# Patient Record
Sex: Female | Born: 1950 | Race: White | Hispanic: No | Smoking: Never smoker
Health system: Southern US, Community
[De-identification: ages and names within clinical notes are randomized; demographics above are authoritative.]

## PROBLEM LIST (undated history)

## (undated) DIAGNOSIS — M797 Fibromyalgia: Secondary | ICD-10-CM

## (undated) DIAGNOSIS — K219 Gastro-esophageal reflux disease without esophagitis: Secondary | ICD-10-CM

## (undated) DIAGNOSIS — M549 Dorsalgia, unspecified: Secondary | ICD-10-CM

## (undated) DIAGNOSIS — M25552 Pain in left hip: Secondary | ICD-10-CM

## (undated) DIAGNOSIS — G4700 Insomnia, unspecified: Secondary | ICD-10-CM

## (undated) DIAGNOSIS — M25551 Pain in right hip: Secondary | ICD-10-CM

## (undated) DIAGNOSIS — I4891 Unspecified atrial fibrillation: Secondary | ICD-10-CM

## (undated) DIAGNOSIS — G4730 Sleep apnea, unspecified: Secondary | ICD-10-CM

## (undated) DIAGNOSIS — I10 Essential (primary) hypertension: Secondary | ICD-10-CM

## (undated) DIAGNOSIS — R42 Dizziness and giddiness: Secondary | ICD-10-CM

## (undated) DIAGNOSIS — G2581 Restless legs syndrome: Secondary | ICD-10-CM

## (undated) DIAGNOSIS — Z889 Allergy status to unspecified drugs, medicaments and biological substances status: Secondary | ICD-10-CM

## (undated) DIAGNOSIS — I499 Cardiac arrhythmia, unspecified: Secondary | ICD-10-CM

## (undated) DIAGNOSIS — H8109 Meniere's disease, unspecified ear: Secondary | ICD-10-CM

## (undated) DIAGNOSIS — E785 Hyperlipidemia, unspecified: Secondary | ICD-10-CM

## (undated) DIAGNOSIS — E669 Obesity, unspecified: Secondary | ICD-10-CM

## (undated) HISTORY — DX: Meniere's disease, unspecified ear: H81.09

## (undated) HISTORY — DX: Pain in left hip: M25.552

## (undated) HISTORY — DX: Pain in right hip: M25.551

## (undated) HISTORY — DX: Fibromyalgia: M79.7

## (undated) HISTORY — PX: BUNIONECTOMY: SHX129

## (undated) HISTORY — DX: Gastro-esophageal reflux disease without esophagitis: K21.9

## (undated) HISTORY — PX: UVULECTOMY: SHX2631

## (undated) HISTORY — PX: APPENDECTOMY: SHX54

## (undated) HISTORY — DX: Hyperlipidemia, unspecified: E78.5

## (undated) HISTORY — DX: Dorsalgia, unspecified: M54.9

## (undated) HISTORY — DX: Unspecified atrial fibrillation: I48.91

## (undated) HISTORY — PX: HEMORROIDECTOMY: SUR656

## (undated) HISTORY — PX: UPPER ENDOSCOPIC ULTRASOUND W/ FNA: SHX2601

## (undated) HISTORY — DX: Dizziness and giddiness: R42

## (undated) HISTORY — DX: Insomnia, unspecified: G47.00

## (undated) HISTORY — DX: Restless legs syndrome: G25.81

## (undated) HISTORY — DX: Obesity, unspecified: E66.9

## (undated) HISTORY — DX: Essential (primary) hypertension: I10

---

## 1978-06-19 HISTORY — PX: OTHER SURGICAL HISTORY: SHX169

## 1998-07-09 ENCOUNTER — Other Ambulatory Visit: Admission: RE | Admit: 1998-07-09 | Discharge: 1998-07-09 | Payer: Self-pay | Admitting: Obstetrics and Gynecology

## 1999-01-17 ENCOUNTER — Encounter: Admission: RE | Admit: 1999-01-17 | Discharge: 1999-02-17 | Payer: Self-pay | Admitting: Family Medicine

## 1999-06-14 ENCOUNTER — Encounter: Payer: Self-pay | Admitting: Family Medicine

## 1999-06-14 ENCOUNTER — Encounter: Admission: RE | Admit: 1999-06-14 | Discharge: 1999-06-14 | Payer: Self-pay | Admitting: Family Medicine

## 1999-07-19 ENCOUNTER — Encounter: Admission: RE | Admit: 1999-07-19 | Discharge: 1999-09-12 | Payer: Self-pay | Admitting: Family Medicine

## 1999-09-20 ENCOUNTER — Other Ambulatory Visit: Admission: RE | Admit: 1999-09-20 | Discharge: 1999-09-20 | Payer: Self-pay | Admitting: Obstetrics and Gynecology

## 2000-11-05 ENCOUNTER — Ambulatory Visit (HOSPITAL_COMMUNITY): Admission: RE | Admit: 2000-11-05 | Discharge: 2000-11-05 | Payer: Self-pay | Admitting: Gastroenterology

## 2000-11-05 ENCOUNTER — Encounter (INDEPENDENT_AMBULATORY_CARE_PROVIDER_SITE_OTHER): Payer: Self-pay | Admitting: Specialist

## 2002-03-13 ENCOUNTER — Other Ambulatory Visit: Admission: RE | Admit: 2002-03-13 | Discharge: 2002-03-13 | Payer: Self-pay | Admitting: Obstetrics and Gynecology

## 2003-03-23 ENCOUNTER — Ambulatory Visit (HOSPITAL_BASED_OUTPATIENT_CLINIC_OR_DEPARTMENT_OTHER): Admission: RE | Admit: 2003-03-23 | Discharge: 2003-03-23 | Payer: Self-pay | Admitting: Orthopedic Surgery

## 2003-03-23 ENCOUNTER — Ambulatory Visit (HOSPITAL_COMMUNITY): Admission: RE | Admit: 2003-03-23 | Discharge: 2003-03-23 | Payer: Self-pay | Admitting: Orthopedic Surgery

## 2003-06-20 HISTORY — PX: THYROIDECTOMY, PARTIAL: SHX18

## 2004-11-30 ENCOUNTER — Ambulatory Visit (HOSPITAL_COMMUNITY): Admission: RE | Admit: 2004-11-30 | Discharge: 2004-11-30 | Payer: Self-pay | Admitting: Gastroenterology

## 2005-02-09 ENCOUNTER — Encounter (INDEPENDENT_AMBULATORY_CARE_PROVIDER_SITE_OTHER): Payer: Self-pay | Admitting: Specialist

## 2005-02-10 ENCOUNTER — Inpatient Hospital Stay (HOSPITAL_COMMUNITY): Admission: RE | Admit: 2005-02-10 | Discharge: 2005-02-15 | Payer: Self-pay | Admitting: Surgery

## 2005-03-15 ENCOUNTER — Encounter: Admission: RE | Admit: 2005-03-15 | Discharge: 2005-03-15 | Payer: Self-pay | Admitting: Surgery

## 2005-06-13 ENCOUNTER — Other Ambulatory Visit: Admission: RE | Admit: 2005-06-13 | Discharge: 2005-06-13 | Payer: Self-pay | Admitting: Family Medicine

## 2005-10-04 ENCOUNTER — Encounter: Admission: RE | Admit: 2005-10-04 | Discharge: 2005-10-04 | Payer: Self-pay | Admitting: Family Medicine

## 2005-10-10 ENCOUNTER — Encounter: Admission: RE | Admit: 2005-10-10 | Discharge: 2005-10-10 | Payer: Self-pay | Admitting: Family Medicine

## 2005-10-17 ENCOUNTER — Other Ambulatory Visit: Admission: RE | Admit: 2005-10-17 | Discharge: 2005-10-17 | Payer: Self-pay | Admitting: Diagnostic Radiology

## 2006-03-08 ENCOUNTER — Ambulatory Visit (HOSPITAL_COMMUNITY): Admission: RE | Admit: 2006-03-08 | Discharge: 2006-03-09 | Payer: Self-pay | Admitting: Surgery

## 2006-03-08 ENCOUNTER — Encounter (INDEPENDENT_AMBULATORY_CARE_PROVIDER_SITE_OTHER): Payer: Self-pay | Admitting: *Deleted

## 2009-05-10 ENCOUNTER — Encounter: Admission: RE | Admit: 2009-05-10 | Discharge: 2009-05-10 | Payer: Self-pay | Admitting: Family Medicine

## 2009-06-09 ENCOUNTER — Ambulatory Visit: Payer: Self-pay | Admitting: Oncology

## 2009-06-14 LAB — CBC WITH DIFFERENTIAL/PLATELET
BASO%: 0.2 % (ref 0.0–2.0)
Eosinophils Absolute: 0.2 10*3/uL (ref 0.0–0.5)
HGB: 12.2 g/dL (ref 11.6–15.9)
LYMPH%: 31.1 % (ref 14.0–49.7)
MCH: 27.8 pg (ref 25.1–34.0)
MONO#: 0.5 10*3/uL (ref 0.1–0.9)
MONO%: 4.3 % (ref 0.0–14.0)
RDW: 12.3 % (ref 11.2–14.5)
WBC: 11.1 10*3/uL — ABNORMAL HIGH (ref 3.9–10.3)

## 2009-06-14 LAB — COMPREHENSIVE METABOLIC PANEL
ALT: 13 U/L (ref 0–35)
Alkaline Phosphatase: 83 U/L (ref 39–117)
BUN: 10 mg/dL (ref 6–23)
CO2: 27 mEq/L (ref 19–32)
Calcium: 8.8 mg/dL (ref 8.4–10.5)
Chloride: 102 mEq/L (ref 96–112)
Creatinine, Ser: 0.66 mg/dL (ref 0.40–1.20)
Sodium: 141 mEq/L (ref 135–145)

## 2009-07-20 ENCOUNTER — Ambulatory Visit (HOSPITAL_BASED_OUTPATIENT_CLINIC_OR_DEPARTMENT_OTHER): Admission: RE | Admit: 2009-07-20 | Discharge: 2009-07-20 | Payer: Self-pay | Admitting: Surgery

## 2010-07-19 ENCOUNTER — Telehealth (INDEPENDENT_AMBULATORY_CARE_PROVIDER_SITE_OTHER): Payer: Self-pay | Admitting: *Deleted

## 2010-07-19 ENCOUNTER — Ambulatory Visit: Payer: Self-pay | Admitting: Cardiology

## 2010-07-20 ENCOUNTER — Institutional Professional Consult (permissible substitution) (INDEPENDENT_AMBULATORY_CARE_PROVIDER_SITE_OTHER): Payer: BC Managed Care – PPO | Admitting: Cardiology

## 2010-07-20 DIAGNOSIS — I10 Essential (primary) hypertension: Secondary | ICD-10-CM

## 2010-07-20 DIAGNOSIS — E119 Type 2 diabetes mellitus without complications: Secondary | ICD-10-CM

## 2010-07-20 DIAGNOSIS — I4891 Unspecified atrial fibrillation: Secondary | ICD-10-CM

## 2010-07-25 ENCOUNTER — Ambulatory Visit: Payer: Self-pay | Admitting: Cardiology

## 2010-07-27 NOTE — Progress Notes (Signed)
Summary: Cardiology Phone Note - Afib  Phone Note Other Incoming Call back at Work Phone [phone]   Caller: Patient Caller: Event Monitoring Summary of Call: Received call from biomonitoring company. Pt had event monitor placed today, with initial recording of sinus rhythm. Second documentation showed afib with a rate of 160. Monitoring company reports that f/up EKG showed sinus rhythm from there with 84bpm. Called pt, at that time she had a "catching" sensation under her left breast with some odd discomfort sensation that has since resolved. D/w Dr. Andee Lineman - since in sinus rhythm, will leave a message for Dr. Elvis Coil office to either see pt in office tomorrow or have Dr. [place] review information for further intervention. Also advised pt that if she should have persistent episode of symptoms, to proceed to ER and not to drive self. She expressed understanding and gratitude. Initial call taken by: Ronie Spies PA-C

## 2010-08-03 ENCOUNTER — Ambulatory Visit (INDEPENDENT_AMBULATORY_CARE_PROVIDER_SITE_OTHER): Payer: BC Managed Care – PPO | Admitting: *Deleted

## 2010-08-03 DIAGNOSIS — R5381 Other malaise: Secondary | ICD-10-CM

## 2010-08-03 DIAGNOSIS — I4891 Unspecified atrial fibrillation: Secondary | ICD-10-CM

## 2010-08-17 ENCOUNTER — Ambulatory Visit: Payer: Self-pay | Admitting: Cardiology

## 2010-08-17 ENCOUNTER — Encounter (INDEPENDENT_AMBULATORY_CARE_PROVIDER_SITE_OTHER): Payer: BC Managed Care – PPO | Admitting: Cardiology

## 2010-08-17 DIAGNOSIS — I4891 Unspecified atrial fibrillation: Secondary | ICD-10-CM

## 2010-09-05 LAB — BASIC METABOLIC PANEL
Creatinine, Ser: 0.67 mg/dL (ref 0.4–1.2)
GFR calc Af Amer: >60 mL/min (ref >60)
GFR calc non Af Amer: >60 mL/min (ref >60)
Potassium: 4.6 mEq/L (ref 3.5–5.1)
Sodium: 139 mEq/L (ref 135–145)

## 2010-09-08 LAB — POCT HEMOGLOBIN-HEMACUE: Hemoglobin: 13.3 g/dL (ref 12.0–15.0)

## 2010-09-08 LAB — GLUCOSE, CAPILLARY
Glucose-Capillary: 124 mg/dL — ABNORMAL HIGH (ref 70–99)
Glucose-Capillary: 138 mg/dL — ABNORMAL HIGH (ref 70–99)

## 2010-10-19 ENCOUNTER — Encounter: Payer: Self-pay | Admitting: Cardiology

## 2010-10-19 DIAGNOSIS — G4700 Insomnia, unspecified: Secondary | ICD-10-CM | POA: Insufficient documentation

## 2010-10-19 DIAGNOSIS — K219 Gastro-esophageal reflux disease without esophagitis: Secondary | ICD-10-CM | POA: Insufficient documentation

## 2010-10-19 DIAGNOSIS — I10 Essential (primary) hypertension: Secondary | ICD-10-CM | POA: Insufficient documentation

## 2010-10-19 DIAGNOSIS — E118 Type 2 diabetes mellitus with unspecified complications: Secondary | ICD-10-CM | POA: Insufficient documentation

## 2010-10-19 DIAGNOSIS — M797 Fibromyalgia: Secondary | ICD-10-CM | POA: Insufficient documentation

## 2010-10-19 DIAGNOSIS — R42 Dizziness and giddiness: Secondary | ICD-10-CM | POA: Insufficient documentation

## 2010-10-19 DIAGNOSIS — H8109 Meniere's disease, unspecified ear: Secondary | ICD-10-CM | POA: Insufficient documentation

## 2010-10-19 DIAGNOSIS — I4891 Unspecified atrial fibrillation: Secondary | ICD-10-CM | POA: Insufficient documentation

## 2010-10-19 DIAGNOSIS — E669 Obesity, unspecified: Secondary | ICD-10-CM | POA: Insufficient documentation

## 2010-10-26 ENCOUNTER — Encounter: Payer: Self-pay | Admitting: Cardiology

## 2010-10-26 ENCOUNTER — Ambulatory Visit (INDEPENDENT_AMBULATORY_CARE_PROVIDER_SITE_OTHER): Payer: BC Managed Care – PPO | Admitting: Cardiology

## 2010-10-26 DIAGNOSIS — I4891 Unspecified atrial fibrillation: Secondary | ICD-10-CM

## 2010-10-26 NOTE — Assessment & Plan Note (Signed)
She will continue on her Pradaxa and metoprolol. I encouraged her lose weight. We'll have her see Dr. Shirlee Latch in followup for cardiology care and Dr. Larita Fife for her primary care.

## 2010-10-26 NOTE — Progress Notes (Signed)
Subjective:   Suzanne Greer is seen today for followup of her paroxysmal atrial fibrillation, obesity, and diabetes. She's been on low-dose metoprolol and Pradaxa and in general has tolerated that well without recurrence of atrial fibrillation. He is exercising regularly at the gymnasium that she is not losing weight. She is off of her Wellbutrin  She's had diabetes for approximately 1 one year. She has a history of hypercholesterolemia. She's had a negative sleep study the past for sleep apnea. She's had an episode of atrial fibrillation. She does have a history of fibromyalgia.  Current Outpatient Prescriptions  Medication Sig Dispense Refill  . cetirizine (ZYRTEC) 10 MG tablet Take 10 mg by mouth daily.        . dabigatran (PRADAXA) 150 MG CAPS Take 150 mg by mouth every 12 (twelve) hours.        Marland Kitchen esomeprazole (NEXIUM) 40 MG capsule Take 40 mg by mouth daily before breakfast.        . Eszopiclone (ESZOPICLONE) 3 MG TABS Take 3 mg by mouth at bedtime. Take immediately before bedtime       . metFORMIN (GLUMETZA) 1000 MG (MOD) 24 hr tablet Take 1,000 mg by mouth daily with breakfast.        . metoprolol succinate (TOPROL-XL) 25 MG 24 hr tablet Take 25 mg by mouth daily.        . simvastatin (ZOCOR) 40 MG tablet Take 40 mg by mouth at bedtime.        . traMADol (ULTRAM) 50 MG tablet Take 50 mg by mouth every 6 (six) hours as needed.        Marland Kitchen DISCONTD: buPROPion (WELLBUTRIN XL) 300 MG 24 hr tablet Take 300 mg by mouth daily.          Allergies  Allergen Reactions  . Sulfa Drugs Cross Reactors     Patient Active Problem List  Diagnoses  . Atrial fibrillation  . Obesity  . Diabetes mellitus  . Fibromyalgia  . Hypertension  . GERD (gastroesophageal reflux disease)  . Insomnia  . Meniere disease  . Vertigo    History  Smoking status  . Never Smoker   Smokeless tobacco  . Never Used    History  Alcohol Use No    Family History  Problem Relation Age of Onset  . Cancer Mother 93      PERITONEAL CANCER    Review of Systems:   The patient denies any heat or cold intolerance.  No weight gain or weight loss.  The patient denies headaches or blurry vision.  There is no cough or sputum production.  The patient denies dizziness.  There is no hematuria or hematochezia.  The patient denies any muscle aches or arthritis.  The patient denies any rash.  The patient denies frequent falling or instability.  There is no history of depression or anxiety.  All other systems were reviewed and are negative.   Physical Exam:   Weight is 226. Blood pressure is 120/68. 112/ 70 standing, heart rate 72.  The head is normocephalic and atraumatic.  Pupils are equally round and reactive to light.  Sclerae nonicteric.  Conjunctiva is clear.  Oropharynx is unremarkable.  There's adequate oral airway.  Neck is supple there are no masses.  Thyroid is not enlarged.  There is no lymphadenopathy.  Lungs are clear.  Chest is symmetric.  Heart shows a regular rate and rhythm.  S1 and S2 are normal.  There is no murmur click or gallop.  Abdomen is soft normal bowel sounds.  There is no organomegaly.  Genital and rectal deferred.  Extremities are without edema.  Peripheral pulses are adequate.  Neurologically intact.  Full range of motion.  The patient is not depressed.  Skin is warm and dry. Assessment / Plan:

## 2010-11-04 NOTE — Op Note (Signed)
NAME:  Suzanne Greer, Suzanne Greer        ACCOUNT NO.:  [id number]   MEDICAL RECORD NO.:  [id number]          PATIENT TYPE:  OBV   LOCATION:  0109                         FACILITY:  [facility]   PHYSICIAN:  Clovis Pu. Cornett, M.D.DATE OF BIRTH:  [birth year]   DATE OF PROCEDURE:  02/09/2005  DATE OF DISCHARGE:                                 OPERATIVE REPORT   PREOPERATIVE DIAGNOSIS:  Appendicitis.   POSTOPERATIVE DIAGNOSIS:  Perforated appendicitis with periappendiceal  abscess.   PROCEDURE:  Laparoscopic converted to open appendectomy.   SURGEON:  Maisie Fus A. Cornett, M.D.   ANESTHESIA:  General endotracheal anesthesia.   ESTIMATED BLOOD LOSS:  50 mL.   SPECIMENS:  Pieces of appendix to pathology.   INDICATIONS FOR PROCEDURE:  The patient is a 60 year old female whose had  abdominal pain for a number of days. It worsened over the last 48 hours. She  underwent a CT scan which showed appendicitis but unfortunately did not  reveal any evidence of perforation or abscess. She was taken to the  operating room for a laparoscopic appendectomy.   DESCRIPTION OF PROCEDURE:  The patient was brought to the operating suite  and placed supine. After induction of general endotracheal anesthesia, the  abdomen was prepped and draped in a sterile fashion. A 1 cm supraumbilical  incision was made, dissection was carried down to her fascia, her fascia was  grasped with a Kocher and a small incision was made in her fascia. I placed  my finger in the preperitoneal space and pushed through. A #0 Vicryl  pursestring suture was placed and a 12 mm Hasson cannula was placed under  direct vision. Pneumoperitoneum was then carried to 15 mmHg or CO2. The  laparoscope was then placed. Two 5 mm ports were placed, one in the midline  in between the pubic symphysis and the umbilicus, a second in the left lower  quadrant. These were done under direct vision. Upon examination of the  cecum, the appendix was densely  adherent to the right abdominal wall. It had  already perforated and there was a large periappendiceal abscess that I  opened up into. I could visualize the base. Numerous attempts were made to  dissect the appendix off the cecum, this was unsuccessful laparoscopically.  Given the perforation, abscess and the inability to dissect the appendix  away from the cecum and the significant inflammation involving the cecum, I  elected to open.   At this point in time, the laparoscopic equipment was withdrawn, the CO2 was  released and the equipment was passed off the field. A right lower quadrant  incision was made over McBurney's point, dissection was carried down through  the fat to the abdominal wall. The abdominal wall was opened in layers until  the abdominal cavity was entered. I placed my hand into the wound and tried  to deliver the cecum up but it was densely adherent and inflamed. After much  manipulation of it, I was able to flip up the cecum to visualize the  appendiceal stump. It was densely adherent to the posterior wall of the  cecum and actually the appendix was retrocecal. In an  attempt to dissect the  appendix away, it began to fall apart in a piecemeal fashion. The appendix  was removed in a piecemeal fashion down to the base. The base of the cecum  was also inflamed at this point in time. I used a GIA 30 stapling device to  fire across the base of the cecum. This closed the base of the appendix and  I elected to oversew it with 3-0 Vicryl and pulled some peritoneal fat over  it and secured it over the stapled closure. This appeared to be satisfactory  but there was significant inflammation. There was no evidence of any injury  to the cecum. Irrigation was used and about a liter was suctioned out. The  cecum was dropped back into the abdominal cavity. I elected to place a drain  due to the abscess cavity in the right lower quadrant. Through a separate  stab incision, a 19 Blake  drain was brought in and placed in the right colic  gutter. At this point, I closed the umbilical trocar site with #0 Vicryl.  After irrigation, the abdominal wall was then closed in layers using a #1  PDS in a running fashion. Given the evidence of perforation, I loosely  approximated the skin edges with 3-0 nylon and packed the wound. I elected  to close the other incisions with staples. The drain was secured with a 3-0  nylon. All sponge, needle and instrument counts were found to be correct at  this portion of the case. The patient was awoke and taken to recovery in  satisfactory condition.      Thomas A. Cornett, M.D.  Electronically Signed     TAC/MEDQ  D:  02/10/2005  T:  02/10/2005  Job:  [id number]   cc:   [facility]

## 2010-11-04 NOTE — Discharge Summary (Signed)
NAME:  Suzanne Greer, Suzanne Greer        ACCOUNT NO.:  [id number]   MEDICAL RECORD NO.:  [id number]          PATIENT TYPE:  INP   LOCATION:  1614                         [facility]ACILITY:  [facility]   PHYSICIAN:  Maisie Fus A. Cornett, M.D.DATE OF BIRTH:  [birth year]   DATE OF ADMISSION:  02/09/2005  DATE OF DISCHARGE:  02/15/2005                                 DISCHARGE SUMMARY   ADMISSION DIAGNOSIS:  Appendicitis.   DISCHARGE DIAGNOSES:  Perforated appendicitis and area appendiceal abscess.   PROCEDURES:  Laparoscopic converted to open appendectomy.   BRIEF HISTORY:  The patient is a 60 year old female admitted on February 09, 2005, with the diagnosis of appendicitis from CT scan.  She was taken to the  operating room.  She had a perforated appendix with a small appendiceal  abscess.  Please see Operative Note for details of the procedure.   HOSPITAL COURSE:  The patient's hospital course was complicated by an ileus.  White counts never really got too high, peaking at 14,500.  At discharge, it  was around 11,000.  Her JP drained nothing but serous drainage, and it was  removed on postoperative day #5.  Her diet was slowly advanced, and her  ileus slowly cleared.  She remained afebrile and had no signs of tachycardia  or hypotension.  She was ambulating with adequate pain control.  Her bowels  were moving.  On examination, her wounds were clean, and her right lower  quadrant incision was very cleaned and packed and was stopped on  postoperative day #5.  Her staples were removed at that point in time.  She  was tolerating liquids and a soft diet fairly well.  She was switched to  p.o. antibiotics and discharged home postoperative day #5 in satisfactory  condition.   DISCHARGE INSTRUCTIONS:  The patient will follow up with me in 7-10 days.  We will have her staples removed and dressings placed.  She will stop  packing her abdominal  wound.  Her medications will include Percocet for  pain, Phenergan 25  mg p.o. q.6 h for nausea and vomiting as well as Cipro  500 mg p.o. b.i.d. and Flagyl 500 mg p.o. q.8 h for the next 4 days to  complete a 10 day course of antibiotics.  She will refrain from driving and  heavy lifting at this point in time.  She may shower.   CONDITION ON DISCHARGE:  Satisfactory.      Thomas A. Cornett, M.D.  Electronically Signed     TAC/MEDQ  D:  02/15/2005  T:  02/15/2005  Job:  [id number]

## 2010-11-04 NOTE — Op Note (Signed)
NAME:  Suzanne Greer, Suzanne Greer        ACCOUNT NO.:  [id number]   MEDICAL RECORD NO.:  [id number]          PATIENT TYPE:  OIB   LOCATION:  1607                         FACILITY:  [facility]   PHYSICIAN:  Velora Heckler, MD      DATE OF BIRTH:  06-08-1951   DATE OF PROCEDURE:  03/08/2006  DATE OF DISCHARGE:  03/09/2006                                 OPERATIVE REPORT   PREOPERATIVE DIAGNOSIS:  Left thyroid nodule with atypia.   POSTOPERATIVE DIAGNOSIS:  Left thyroid nodule with atypia.   PROCEDURE:  Left thyroid lobectomy.   SURGEON:  Velora Heckler, MD, FACS   ASSISTANT:  Harriette Bouillon, MD, FACS   ANESTHESIA:  General.   ESTIMATED BLOOD LOSS:  Minimal.   PREPARATION:  Betadine.   COMPLICATIONS:  None.   INDICATIONS:  The patient is a 60 year old white female from [place],  [place] who presents with left thyroid nodule.  This was found  incidentally on chest CT scan.  Ultrasound demonstrated a 3.2 cm solid  lesion in the inferior aspect of the left thyroid lobe.  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  The patient now comes to the operating room for excision for  definitive diagnosis.   DESCRIPTION OF PROCEDURE:  The procedure is done in OR #6 at the [facility].  The patient is brought to the operating room, placed in  a supine position on the operating room table.  Following the administration  of general anesthesia, the patient is positioned and then prepped and draped  in the usual strict aseptic fashion.  After ascertaining that an adequate  level of anesthesia had been obtained, a Kocher incision was made with a #15  blade.  Dissection was carried through subcutaneous tissues and platysma.  Hemostasis was obtained with the electrocautery.  Skin flaps were elevated  cephalad and caudad from the thyroid notch to the sternal notch.  A Mahorner  self-retaining retractor was placed for  exposure.  The strap muscles were  incised in the midline and dissection was begun on the left side of the  neck.  The strap muscles were reflected laterally.  The left thyroid lobe  was exposed.  It was gently mobilized with a Barista.  Venous  tributaries were divided between small Ligaclips.  The superior pole vessels  were dissected out, ligated in continuity with 2-0 silk ties and medium  Ligaclips and divided.  The parathyroid tissue was identified and preserved.  The recurrent nerve was identified and preserved.  Branches of the inferior  thyroid artery were divided between small Ligaclips.  The inferior venous  tributaries were divided between medium Ligaclips.  The gland is rolled  anteriorly.  A small pyramidal lobe was mobilized and included with the  specimen.  The ligament of Allyson Sabal was transected with the electrocautery and  the gland was rolled up and onto the anterior surface of the trachea.  It  was mobilized across the midline.  The isthmus was transected at its  junction with the right thyroid lobe between hemostats and suture ligated  with 3-0 Vicryl suture ligatures.  The specimen was passed off the field.  Dr. Esther Hardy performed frozen section analysis and confirmed a follicular  lesion but favors an adenomatous nodule.  No sign of malignancy was  identified.  Good hemostasis was obtained in the left neck.  Surgicel was  placed over the area of the recurrent nerve and parathyroid glands.  The  right thyroid lobe was exposed and palpated bit appears grossly normal.  The  strap muscles were reapproximated in the midline with interrupted 3-0 Vicryl  sutures.  The platysma was closed with interrupted 3-0 Vicryl sutures.  The  skin was closed with running 4-0 Vicryl subcuticular suture.  The wound is  washed and dried and Benzoin and Steri-Strips are applied.  Sterile  dressings are applied.  The patient is awakened from anesthesia and brought  to the recovery  room in stable condition.  The patient tolerated the  procedure well.      Velora Heckler, MD  Electronically Signed     TMG/MEDQ  D:  03/08/2006  T:  03/09/2006  Job:  [id number]   cc:   Thelma Barge P. Modesto Charon, M.D.  Fax: [phone]

## 2010-11-04 NOTE — Procedures (Signed)
[place]  Patient:    Suzanne Greer, Suzanne Greer                 MRN: [id number] Proc. Date: 11/05/00 Attending:  Verlin Grills, M.D. CC:         Redmond Baseman, M.D.   Procedure Report  DATE OF BIRTH:  12-Apr-1951.  REFERRING PHYSICIAN:  PROCEDURE PERFORMED:  Esophagogastroduodenoscopy, colonoscopy and polypectomy.  ENDOSCOPIST:  Verlin Grills, M.D.  INDICATIONS FOR PROCEDURE:  The patient is a 60 year old female.  On June 06, 1996 she underwent a proctocolonoscopy to the cecum.  Small hyperplastic- appearing colon polyps were removed.  Pathological evaluation of these polyps returned hyperplastic.  There were no adenomatous polyps.  The patient has undergone an esophagogastroduodenoscopy performed by Dr. Sabino Gasser to evaluate gastroesophageal reflux type symptoms.  She was told she had a hiatal hernia and erosions in the distal esophagus.  Her acid reflux type symptoms are well controlled on Prilosec.  Suzanne Greer is followed by Dr. Sharyn Lull who manages her environmental allergies.  The patient has predominantly constipation as her main gastrointestinal complaint.  She normally uses fiber as her natural laxative with good results.  She has required hemorrhoidal banding and external hemorrhoidal lancing in the past.  She reports no hematochezia.  I discussed with Suzanne Greer the complications associated with esophagogastroduodenoscopy to rule out Barretts esophagus and proctocolonoscopy to the cecum with polypectomy to prevent colon cancer including intestinal bleeding and intestinal perforation.  The patient has signed the operative permit.  ALLERGIES:  PENICILLIN, SULFA.  CHRONIC MEDICATIONS:  Flexeril, Estratest, Provera, Celebrex, hydrochlorothiazide, Prilosec and Serzone.  PAST MEDICAL HISTORY:  Fibromyalgia syndrome, chronic constipation, anxiety, postmenopausal on hormone replacement therapy,  internal and external hemorrhoids, insomnia.  PREMEDICATION:  Fentanyl 100 mcg, Versed 14 mg.  ENDOSCOPE:  Olympus pediatric video colonoscope.  PROCEDURE:  Esophagogastroduodenoscopy.  DESCRIPTION OF PROCEDURE:  After obtaining informed consent, the patient was placed in the left lateral decubitus position.  I administered intravenous fentanyl and intravenous Versed to achieve conscious sedation for the procedure.  The patients blood pressure, oxygen saturation and cardiac rhythm were monitored throughout the procedure and documented in the medical record.  The Olympus pediatric colonoscope was passed through the posterior hypopharynx into the proximal esophagus without difficulty.  The hypopharynx, larynx and vocal cords appeared normal.  Esophagoscopy:  The proximal, mid and lower segments of the esophagus appeared completely normal.  Endoscopically, there is no evidence of esophageal mucosal scarring, erosive esophagitis, Barretts esophagus, esophageal ulceration, or esophageal obstruction.  Gastroscopy:  Retroflex view of the gastric cardia and fundus was normal.  The gastric body, antrum and pylorus appeared completely normal.  Duodenoscopy:  The duodenal bulb, midduodenum and distal duodenum appeared normal.  ASSESSMENT:  Normal esophagogastroduodenoscopy.  PROCEDURE:  Proctocolonoscopy to the cecum.  DESCRIPTION OF PROCEDURE:  Anal inspection was normal.  Digital rectal exam was normal.  The Olympus pediatric video colonoscope was then introduced into the rectum and advanced to the cecum without difficulty.  Colonic preparation for the exam today was excellent.  Rectum:  From the proximal rectum two 0.5 mm sessile polyps were removed with the cold biopsy forceps.  Sigmoid colon and descending colon:  Normal.  Splenic flexure:  Normal.  Transverse colon:  Normal.  Hepatic flexure:  Normal.  Ascending colon:  Normal.  Cecum and ileocecal valve:   Normal.  ASSESSMENT: 1. From the proximal rectum, two 0.5 mm sessile polyps were removed  with cold    biopsy forceps; otherwise normal proctocolonoscopy to the cecum.  RECOMMENDATIONS:  Repeat colonoscopy in 10 years. DD:  11/05/00 TD:  11/05/00 Job: [id number]

## 2010-11-04 NOTE — H&P (Signed)
NAME:  Suzanne Greer, Suzanne Greer        ACCOUNT NO.:  [id number]   MEDICAL RECORD NO.:  [id number]          PATIENT TYPE:  INP   LOCATION:                               FACILITY:  [facility]   PHYSICIAN:  Thomas A. Cornett, M.D.DATE OF BIRTH:  09-17-1950   DATE OF ADMISSION:  DATE OF DISCHARGE:  02/15/2005                                HISTORY & PHYSICAL   CHIEF COMPLAINT:  Right lower quadrant pain.   HISTORY OF PRESENT ILLNESS:  The patient is a 60 year old female with a 2-3  day history of abdominal pain.  Yesterday, it began to localize in her right  lower quadrant.  Today it was even more in her right lower quadrant.  She  was seen at [facility] for an external hemorrhoid, which Dr.  Lurene Shadow had drained.  Dr. Ezzard Standing had seen her earlier today and sent her for  a CT scan since her abdominal pain was worsening.  She is complaining mostly  of right lower quadrant with no radiation.  It started out diffusely, but it  localized to her right lower quadrant.  She has had no nausea or vomiting.   PAST MEDICAL HISTORY:  Gastroesophageal reflux disease.   PAST SURGICAL HISTORY:  None.   ALLERGIES:  1.  PENICILLIN.  2.  SULFA.   HOME MEDICATIONS:  Nexium 40 mg b.i.d. as well as some pain medicine for  external thrombosed hemorrhoid she had drained yesterday.   FAMILY HISTORY:  Noncontributory.   REVIEW OF SYSTEMS:  Positive for right lower quadrant pain and perianal  pain, otherwise a negative review of systems.   PHYSICAL EXAMINATION:  GENERAL APPEARANCE:  A white female in no apparent  distress.  HEENT:  Extraocular movements are intact.  Normocephalic and atraumatic.  NECK:  Supple and nontender.  CHEST:  Clear to auscultation.  CARDIOVASCULAR:  Regular rate and rhythm.  ABDOMEN:  Positive rebound and guarding in her right lower quadrant,  specifically at McBurney's point without mass lesion.  EXTREMITIES:  No clubbing, cyanosis or edema.  NEUROLOGIC:  Normal.   X-ray  studies revealed periappendiceal inflammatory change, consistent with  appendicitis.   IMPRESSION:  Acute appendicitis.   PLAN:  We will take her to the operating room for laparoscopic appendectomy.  I have discussed this with the patient and her husband tonight, and we have  explained the procedure as well as the possible conversion to an open  procedure.  They voiced understanding and agreed to proceed.      Thomas A. Cornett, M.D.  Electronically Signed     TAC/MEDQ  D:  02/09/2005  T:  02/09/2005  Job:  [id number]

## 2010-11-04 NOTE — Op Note (Signed)
NAME:  Suzanne Greer, Suzanne Greer                  ACCOUNT NO.:  [id number]   MEDICAL RECORD NO.:  [id number]                   PATIENT TYPE:  AMB   LOCATION:  DSC                                  FACILITY:  [facility]   PHYSICIAN:  Robert A. Thurston Hole, M.D.              DATE OF BIRTH:  Feb 27, 1951   DATE OF PROCEDURE:  03/23/2003  DATE OF DISCHARGE:                                 OPERATIVE REPORT   PREOPERATIVE DIAGNOSIS:  Right shoulder partial rotator cuff tear with  impingement, acromioclavicular joint spurring and degenerative joint  disease.   POSTOPERATIVE DIAGNOSIS:  Right shoulder partial rotator cuff tear with  impingement, acromioclavicular joint spurring and degenerative joint  disease.   PROCEDURE:  1. Right shoulder examination under anesthesia followed by arthroscopic     partial rotator cuff tear debridement.  2. Right shoulder subacromial decompression.  3. Right shoulder distal clavicle excision.   SURGEON:  Elana Alm. Thurston Hole, M.D.   ASSISTANT:  Julien Girt, P.A.   ANESTHESIA:  General.   OPERATIVE TIME:  45 minutes.   COMPLICATIONS:  None.   INDICATIONS FOR PROCEDURE:  The patient is a 60 year old woman who has had  four to five months of increasing right shoulder pain with exam and MRI  documenting a partial rotator cuff tear with impingement and  acromioclavicular joint arthropathy.  She has failed conservative care and  is now to undergo an arthroscopy.   DESCRIPTION OF PROCEDURE:  The patient was brought to the operating room on  March 23, 2003 after an interscalene block had been placed in the holding  room by anesthesia for postoperative pain control.  She was placed on the  operative table in the supine position.  After being placed under general  anesthesia her right shoulder was examined under anesthesia.  She had full  range of motion and her shoulder was stable to ligamentous exam.  She was  then placed in the beach chair position and her  shoulder and arm were  prepped using sterile Duraprep and draped using sterile technique.  Originally through a posterior arthroscopic portal, the arthroscope with a  pump attached was placed into an anterior portal and arthroscopic probe was  placed.  On initial inspection the articular cartilage in the glenohumeral  joint was intact.  The anterior and posterior labrum were intact.  The  superior labrum, biceps tendon and anchor were intact.  The inferior labrum  and anterior inferior glenohumeral ligament complex was intact.  The biceps  tendon was intact.  The rotator cuff showed a partial tear of the  supraspinatus and infraspinatus, 40 to 50%, which was debrided, but a  complete tear was not found.  The inferior capsule recess was free of  pathology.  The subacromial space was entered and a lateral arthroscopic  portal was made.  Moderately thickened bursitis was resected.  The rotator  cuff underneath this was somewhat frayed and inflamed, but no evidence of  any  significant tearing.  Impingement was noted and a subacromial  decompression was carried out removing 6 to 8 mm of the under surface of the  anterior, anterior lateral and anterior medial acromion and CA ligament  release carried out as well.  The acromioclavicular joint showed significant  spurring and degenerative changes and the distal 5 to 6 mm of the clavicle  was resected with a 6 mm bur.  After this was done the shoulder could be  brought through a full range of motion with no impingement on the rotator  cuff. At this point, it was felt that all pathology had been satisfactorily  addressed.  The instruments were removed.  The portals were closed with 3-0  nylon suture.  Sterile dressings and a sling were applied.  The patient was  awakened and taken to the recovery room in stable condition.   FOLLOW-UP CARE:  The patient will be followed as an outpatient on Percocet  and Celebrex with early physical therapy.  We will  see her back in the  office in a week for sutures out and follow-up.                                               Robert A. Thurston Hole, M.D.    RAW/MEDQ  D:  03/23/2003  T:  03/23/2003  Job:  [id number]

## 2010-11-04 NOTE — Op Note (Signed)
NAME:  Suzanne Greer, Suzanne Greer        ACCOUNT NO.:  [id number]   MEDICAL RECORD NO.:  [id number]          PATIENT TYPE:  AMB   LOCATION:  ENDO                         [facility]AC[facility]  [facility]   PHYSICIAN:  Petra Kuba, M.D.    DATE OF BIRTH:  01/19/1951   DATE OF PROCEDURE:  11/30/2004  DATE OF DISCHARGE:                                 OPERATIVE REPORT   PROCEDURE:  Esophagogastroduodenoscopy.   INDICATIONS:  Upper tract symptoms not responding to the usual medicines.  Consent was signed after risks, benefits, methods, and options thoroughly  discussed in the office.   MEDICATIONS:  Demerol 50 mg, Versed 5 mg.   PROCEDURE:  The video endoscope was inserted by direct vision.  Esophagus  was normal.  She did have a small hiatal hernia without any obvious reflux  changes.  The scope passed in the stomach, advanced through a normal antrum  and a normal pylorus into a normal duodenal bulb and around the C-loop to a  normal second portion of the duodenum.  The scope was withdrawn back to the  bulb, which again confirmed its normal appearance.  The scope was withdrawn  back to the stomach and retroflexed.  Cardia, fundus, angularis, lesser and  greater curve were normal on retroflexed visualization.  Straight  visualization of the stomach did not reveal any additional findings.  We  went ahead and repeated the endoscopy just to double check, and no  additional findings were seen.  Air was suctioned and the scope was slowly  withdrawn.  Again a good look at the esophagus was normal except for the  small hiatal hernia.  The scope was removed.  The patient tolerated the  procedure well.  There was no obvious immediate complication.   ENDOSCOPIC DIAGNOSES:  1.  Small hiatal hernia.  2.  Otherwise normal esophagogastroduodenoscopy.   PLAN:  Continue double-dose Nexium since that seems to be helping.  Follow  up p.r.n. or in two months to recheck symptoms and make sure no further  workup plans are  needed.       MEM/MEDQ  D:  11/30/2004  T:  11/30/2004  Job:  [id number]   cc:   Maryla Morrow. Modesto Charon, M.D.  [address]  [place]  [place] [address]  Fax: [phone]

## 2011-03-26 ENCOUNTER — Encounter (HOSPITAL_BASED_OUTPATIENT_CLINIC_OR_DEPARTMENT_OTHER): Payer: Self-pay | Admitting: *Deleted

## 2011-03-26 ENCOUNTER — Emergency Department (HOSPITAL_BASED_OUTPATIENT_CLINIC_OR_DEPARTMENT_OTHER)
Admission: EM | Admit: 2011-03-26 | Discharge: 2011-03-26 | Disposition: A | Discharge status: Home / Self Care | Payer: BC Managed Care – PPO | Attending: Emergency Medicine | Admitting: Emergency Medicine

## 2011-03-26 DIAGNOSIS — E119 Type 2 diabetes mellitus without complications: Secondary | ICD-10-CM | POA: Insufficient documentation

## 2011-03-26 DIAGNOSIS — M549 Dorsalgia, unspecified: Secondary | ICD-10-CM

## 2011-03-26 DIAGNOSIS — I10 Essential (primary) hypertension: Secondary | ICD-10-CM | POA: Insufficient documentation

## 2011-03-26 DIAGNOSIS — E669 Obesity, unspecified: Secondary | ICD-10-CM | POA: Insufficient documentation

## 2011-03-26 DIAGNOSIS — G4730 Sleep apnea, unspecified: Secondary | ICD-10-CM | POA: Insufficient documentation

## 2011-03-26 DIAGNOSIS — K219 Gastro-esophageal reflux disease without esophagitis: Secondary | ICD-10-CM | POA: Insufficient documentation

## 2011-03-26 DIAGNOSIS — I4891 Unspecified atrial fibrillation: Secondary | ICD-10-CM | POA: Insufficient documentation

## 2011-03-26 MED ORDER — HYDROCODONE-ACETAMINOPHEN 5-325 MG PO TABS
2.0000 | ORAL_TABLET | Freq: Once | ORAL | Status: AC
  Administered 2011-03-26: 2 via ORAL
  Filled 2011-03-26: qty 2

## 2011-03-26 MED ORDER — HYDROCODONE-ACETAMINOPHEN 5-500 MG PO TABS: 1.0000 | ORAL_TABLET | Freq: Four times a day (QID) | ORAL | Status: AC | PRN

## 2011-03-26 MED ORDER — KETOROLAC TROMETHAMINE 60 MG/2ML IM SOLN
60.0000 mg | Freq: Once | INTRAMUSCULAR | Status: AC
  Administered 2011-03-26: 60 mg via INTRAMUSCULAR
  Filled 2011-03-26: qty 2

## 2011-03-26 NOTE — ED Notes (Signed)
Pt states sghe has a hx of back problems and is usually able to "manage " them, but yest, worked in garden and is now having increased pain.

## 2011-03-26 NOTE — ED Provider Notes (Signed)
Scribed for Geoffery Lyons, MD, the patient was seen in room MH01/MH01 . This chart was scribed by Ellie Lunch. This patient's care was started at 6:56 PM.   CSN: [id number] Arrival date & time: 03/26/2011  6:03 PM  Chief Complaint  Patient presents with  . Back Pain    (Consider location/radiation/quality/duration/timing/severity/associated sxs/prior treatment) HPI Suzanne Greer is a 60 y.o. female who presents to the Emergency Department complaining of lower back pain. Pt reports a history of back pain. Says she is usually able to manage the pain, but pain has become increasingly worse for the past 2 months after a trip in September. Reports she sleeps on a heading pad nightly and takes tramadol for pain. Pt reports aggravating pain after working in the garden yesterday. Treated with tramadol with little improvement. Pain is currently rated 7/10 in severity. Denies bowel or bladder incontinence.  Reports some tingling down right leg onset ~40 minutes ago. Reports increased loose stool in the past month, believe it may be dietary.    Past Medical History  Diagnosis Date  . Atrial fibrillation   . Obesity   . Diabetes mellitus   . Fibromyalgia   . Hypertension   . GERD (gastroesophageal reflux disease)   . Insomnia   . 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 Vertigo   Sleep apnea  Past Surgical History  Procedure Date  . Appendectomy   . Bunionectomy   . Thyroidectomy, partial 2005  . Ganglion cyst  1980    REMOVED   . Hemorroidectomy     Family History  Problem Relation Age of Onset  . Cancer Mother 47    PERITONEAL CANCER    History  Substance Use Topics  . Smoking status: Never Smoker   . Smokeless tobacco: Never Used  . Alcohol Use: No    Review of Systems 10 Systems reviewed and are negative for acute change except as noted in the HPI.  Allergies  Penicillins and Sulfa drugs cross reactors  Home Medications   Current  Outpatient Rx  Name Route Sig Dispense Refill  . CETIRIZINE HCL 10 MG PO TABS Oral Take 10 mg by mouth daily.      Marland Kitchen ESOMEPRAZOLE MAGNESIUM 40 MG PO CPDR Oral Take 40 mg by mouth daily before breakfast.      . ESZOPICLONE 3 MG PO TABS Oral Take 3 mg by mouth at bedtime. Take immediately before bedtime     . METFORMIN HCL 1000 MG (MOD) PO TB24 Oral Take 1,000 mg by mouth daily with breakfast.      . METOPROLOL SUCCINATE 50 MG PO TB24 Oral Take 25 mg by mouth daily.      Marland Kitchen SIMVASTATIN 40 MG PO TABS Oral Take 40 mg by mouth at bedtime.      . TRAMADOL HCL 50 MG PO TABS Oral Take 50 mg by mouth 2 (two) times daily as needed. For pain    . DABIGATRAN ETEXILATE MESYLATE 150 MG PO CAPS Oral Take 150 mg by mouth every 12 (twelve) hours.      Marland Kitchen METOPROLOL SUCCINATE 25 MG PO TB24 Oral Take 25 mg by mouth daily.        BP 127/108  Pulse 84  Temp(Src) 98.1 F (36.7 C) (Oral)  Resp 20  Ht 5\' 6"  (1.676 m)  Wt 223 lb (101.152 kg)  BMI 35.99 kg/m2  SpO2 99%  Physical Exam  Nursing note and vitals reviewed.  Constitutional: She is oriented to person, place, and time. She appears well-developed and well-nourished. No distress.  HENT:  Head: Normocephalic and atraumatic.  Eyes: EOM are normal.  Neck: Normal range of motion.  Cardiovascular: Normal rate, regular rhythm and normal heart sounds.   Pulmonary/Chest: Effort normal and breath sounds normal.  Abdominal: Soft. She exhibits no distension. There is no tenderness.  Musculoskeletal: Normal range of motion.       Tender lower lumbar spin.  Neurological: She is alert and oriented to person, place, and time. She has normal strength.  Skin: Skin is warm and dry.  Psychiatric: She has a normal mood and affect. Judgment normal.    ED Course  Procedures (including critical care time)  OTHER DATA REVIEWED: Nursing notes, vital signs, and past medical records reviewed.   DIAGNOSTIC STUDIES: Oxygen Saturation is 97% on room air, normal by my  interpretation.     MEDICATIONS GIVEN IN THE E.D.  Medications  HYDROcodone-acetaminophen (NORCO) 5-325 MG per tablet 2 tablet   ketorolac (TORADOL) injection 60 mg    DISCHARGE MEDICATIONS: New Prescriptions   HYDROCODONE-ACETAMINOPHEN (VICODIN) 5-500 MG PER TABLET    Take 1-2 tablets by mouth every 6 (six) hours as needed for pain.    SCRIBE ATTESTATION: I personally performed the services described in this documentation, which was scribed in my presence. The recorded information has been reviewed and considered.          Geoffery Lyons, MD 03/26/11 2040

## 2012-02-22 ENCOUNTER — Ambulatory Visit: Payer: Federal, State, Local not specified - PPO | Admitting: Licensed Clinical Social Worker

## 2012-03-07 ENCOUNTER — Ambulatory Visit (INDEPENDENT_AMBULATORY_CARE_PROVIDER_SITE_OTHER): Payer: Federal, State, Local not specified - PPO | Admitting: Licensed Clinical Social Worker

## 2012-03-07 DIAGNOSIS — F331 Major depressive disorder, recurrent, moderate: Secondary | ICD-10-CM

## 2012-03-14 ENCOUNTER — Encounter: Payer: Self-pay | Admitting: Cardiology

## 2012-03-21 ENCOUNTER — Ambulatory Visit (INDEPENDENT_AMBULATORY_CARE_PROVIDER_SITE_OTHER): Payer: Federal, State, Local not specified - PPO | Admitting: Licensed Clinical Social Worker

## 2012-03-21 DIAGNOSIS — F331 Major depressive disorder, recurrent, moderate: Secondary | ICD-10-CM

## 2012-04-09 ENCOUNTER — Ambulatory Visit: Payer: Federal, State, Local not specified - PPO | Admitting: Licensed Clinical Social Worker

## 2012-04-11 ENCOUNTER — Ambulatory Visit (INDEPENDENT_AMBULATORY_CARE_PROVIDER_SITE_OTHER): Payer: Federal, State, Local not specified - PPO | Admitting: Licensed Clinical Social Worker

## 2012-04-11 DIAGNOSIS — F331 Major depressive disorder, recurrent, moderate: Secondary | ICD-10-CM

## 2012-05-07 ENCOUNTER — Ambulatory Visit (INDEPENDENT_AMBULATORY_CARE_PROVIDER_SITE_OTHER): Payer: Federal, State, Local not specified - PPO | Admitting: Licensed Clinical Social Worker

## 2012-05-07 DIAGNOSIS — F331 Major depressive disorder, recurrent, moderate: Secondary | ICD-10-CM

## 2012-06-06 ENCOUNTER — Ambulatory Visit (INDEPENDENT_AMBULATORY_CARE_PROVIDER_SITE_OTHER): Payer: Federal, State, Local not specified - PPO | Admitting: Licensed Clinical Social Worker

## 2012-06-06 DIAGNOSIS — F331 Major depressive disorder, recurrent, moderate: Secondary | ICD-10-CM

## 2012-07-23 ENCOUNTER — Ambulatory Visit (INDEPENDENT_AMBULATORY_CARE_PROVIDER_SITE_OTHER): Payer: Federal, State, Local not specified - PPO | Admitting: Licensed Clinical Social Worker

## 2012-07-23 DIAGNOSIS — F331 Major depressive disorder, recurrent, moderate: Secondary | ICD-10-CM

## 2012-09-03 ENCOUNTER — Ambulatory Visit (INDEPENDENT_AMBULATORY_CARE_PROVIDER_SITE_OTHER): Payer: BC Managed Care – PPO | Admitting: Licensed Clinical Social Worker

## 2012-09-03 DIAGNOSIS — F331 Major depressive disorder, recurrent, moderate: Secondary | ICD-10-CM

## 2012-10-29 ENCOUNTER — Ambulatory Visit (INDEPENDENT_AMBULATORY_CARE_PROVIDER_SITE_OTHER): Payer: BC Managed Care – PPO | Admitting: Licensed Clinical Social Worker

## 2012-10-29 DIAGNOSIS — F331 Major depressive disorder, recurrent, moderate: Secondary | ICD-10-CM

## 2014-06-16 DIAGNOSIS — E6609 Other obesity due to excess calories: Secondary | ICD-10-CM | POA: Diagnosis not present

## 2014-06-16 DIAGNOSIS — E119 Type 2 diabetes mellitus without complications: Secondary | ICD-10-CM | POA: Diagnosis not present

## 2014-06-16 DIAGNOSIS — K219 Gastro-esophageal reflux disease without esophagitis: Secondary | ICD-10-CM | POA: Diagnosis not present

## 2014-06-16 DIAGNOSIS — I213 ST elevation (STEMI) myocardial infarction of unspecified site: Secondary | ICD-10-CM | POA: Diagnosis not present

## 2014-06-16 DIAGNOSIS — I480 Paroxysmal atrial fibrillation: Secondary | ICD-10-CM | POA: Diagnosis not present

## 2014-06-16 DIAGNOSIS — M797 Fibromyalgia: Secondary | ICD-10-CM | POA: Diagnosis not present

## 2014-06-16 DIAGNOSIS — Z79899 Other long term (current) drug therapy: Secondary | ICD-10-CM | POA: Diagnosis not present

## 2014-06-16 DIAGNOSIS — I517 Cardiomegaly: Secondary | ICD-10-CM | POA: Diagnosis not present

## 2014-06-16 DIAGNOSIS — H8109 Meniere's disease, unspecified ear: Secondary | ICD-10-CM | POA: Diagnosis not present

## 2014-06-16 DIAGNOSIS — R079 Chest pain, unspecified: Secondary | ICD-10-CM | POA: Diagnosis not present

## 2014-06-16 DIAGNOSIS — G4733 Obstructive sleep apnea (adult) (pediatric): Secondary | ICD-10-CM | POA: Diagnosis not present

## 2014-06-16 DIAGNOSIS — I519 Heart disease, unspecified: Secondary | ICD-10-CM | POA: Diagnosis not present

## 2014-06-16 DIAGNOSIS — Z7982 Long term (current) use of aspirin: Secondary | ICD-10-CM | POA: Diagnosis not present

## 2014-07-31 DIAGNOSIS — H9071 Mixed conductive and sensorineural hearing loss, unilateral, right ear, with unrestricted hearing on the contralateral side: Secondary | ICD-10-CM | POA: Diagnosis not present

## 2014-08-07 DIAGNOSIS — M7732 Calcaneal spur, left foot: Secondary | ICD-10-CM | POA: Diagnosis not present

## 2014-08-07 DIAGNOSIS — M722 Plantar fascial fibromatosis: Secondary | ICD-10-CM | POA: Diagnosis not present

## 2014-08-07 DIAGNOSIS — E119 Type 2 diabetes mellitus without complications: Secondary | ICD-10-CM | POA: Diagnosis not present

## 2014-08-12 ENCOUNTER — Ambulatory Visit: Payer: Medicare Other | Admitting: Podiatry

## 2014-08-14 DIAGNOSIS — Z0000 Encounter for general adult medical examination without abnormal findings: Secondary | ICD-10-CM | POA: Diagnosis not present

## 2014-08-14 DIAGNOSIS — Z8739 Personal history of other diseases of the musculoskeletal system and connective tissue: Secondary | ICD-10-CM | POA: Diagnosis not present

## 2014-09-02 ENCOUNTER — Other Ambulatory Visit: Payer: Self-pay

## 2014-09-02 ENCOUNTER — Encounter (HOSPITAL_BASED_OUTPATIENT_CLINIC_OR_DEPARTMENT_OTHER): Payer: Self-pay | Admitting: *Deleted

## 2014-09-02 ENCOUNTER — Emergency Department (HOSPITAL_BASED_OUTPATIENT_CLINIC_OR_DEPARTMENT_OTHER)
Admission: EM | Admit: 2014-09-02 | Discharge: 2014-09-02 | Disposition: A | Discharge status: Home / Self Care | Payer: Medicare Other | Attending: Emergency Medicine | Admitting: Emergency Medicine

## 2014-09-02 ENCOUNTER — Emergency Department (HOSPITAL_BASED_OUTPATIENT_CLINIC_OR_DEPARTMENT_OTHER): Payer: Medicare Other

## 2014-09-02 DIAGNOSIS — M797 Fibromyalgia: Secondary | ICD-10-CM | POA: Insufficient documentation

## 2014-09-02 DIAGNOSIS — I4892 Unspecified atrial flutter: Secondary | ICD-10-CM | POA: Insufficient documentation

## 2014-09-02 DIAGNOSIS — R002 Palpitations: Secondary | ICD-10-CM | POA: Diagnosis present

## 2014-09-02 DIAGNOSIS — E119 Type 2 diabetes mellitus without complications: Secondary | ICD-10-CM | POA: Insufficient documentation

## 2014-09-02 DIAGNOSIS — E669 Obesity, unspecified: Secondary | ICD-10-CM | POA: Insufficient documentation

## 2014-09-02 DIAGNOSIS — K219 Gastro-esophageal reflux disease without esophagitis: Secondary | ICD-10-CM | POA: Diagnosis not present

## 2014-09-02 DIAGNOSIS — J159 Unspecified bacterial pneumonia: Secondary | ICD-10-CM | POA: Insufficient documentation

## 2014-09-02 DIAGNOSIS — J189 Pneumonia, unspecified organism: Secondary | ICD-10-CM | POA: Diagnosis not present

## 2014-09-02 DIAGNOSIS — Z880 Allergy status to penicillin: Secondary | ICD-10-CM | POA: Diagnosis not present

## 2014-09-02 DIAGNOSIS — Z79899 Other long term (current) drug therapy: Secondary | ICD-10-CM | POA: Insufficient documentation

## 2014-09-02 DIAGNOSIS — I10 Essential (primary) hypertension: Secondary | ICD-10-CM | POA: Diagnosis not present

## 2014-09-02 DIAGNOSIS — R05 Cough: Secondary | ICD-10-CM | POA: Diagnosis not present

## 2014-09-02 DIAGNOSIS — I4891 Unspecified atrial fibrillation: Secondary | ICD-10-CM | POA: Insufficient documentation

## 2014-09-02 HISTORY — DX: Sleep apnea, unspecified: G47.30

## 2014-09-02 LAB — COMPREHENSIVE METABOLIC PANEL
ALBUMIN: 3.5 g/dL (ref 3.5–5.2)
ALK PHOS: 90 U/L (ref 39–117)
ALT: 13 U/L (ref 0–35)
AST: 19 U/L (ref 0–37)
Anion gap: 12 (ref 5–15)
BILIRUBIN TOTAL: 0.5 mg/dL (ref 0.3–1.2)
BUN: 8 mg/dL (ref 6–23)
CHLORIDE: 99 mmol/L (ref 96–112)
CO2: 24 mmol/L (ref 19–32)
Calcium: 9 mg/dL (ref 8.4–10.5)
Creatinine, Ser: 0.74 mg/dL (ref 0.50–1.10)
GFR calc Af Amer: >90 mL/min (ref >90)
GFR calc non Af Amer: 89 mL/min — ABNORMAL LOW (ref >90)
Glucose, Bld: 178 mg/dL — ABNORMAL HIGH (ref 70–99)
POTASSIUM: 3.3 mmol/L — AB (ref 3.5–5.1)
SODIUM: 135 mmol/L (ref 135–145)
Total Protein: 7.9 g/dL (ref 6.0–8.3)

## 2014-09-02 LAB — CBC WITH DIFFERENTIAL/PLATELET
Basophils Absolute: 0 10*3/uL (ref 0.0–0.1)
Basophils Relative: 0 % (ref 0–1)
Eosinophils Absolute: 0.1 10*3/uL (ref 0.0–0.7)
Eosinophils Relative: 0 % (ref 0–5)
HEMATOCRIT: 38.9 % (ref 36.0–46.0)
HEMOGLOBIN: 13.2 g/dL (ref 12.0–15.0)
LYMPHS PCT: 16 % (ref 12–46)
Lymphs Abs: 2.6 10*3/uL (ref 0.7–4.0)
MCH: 28.3 pg (ref 26.0–34.0)
MCHC: 33.9 g/dL (ref 30.0–36.0)
MCV: 83.5 fL (ref 78.0–100.0)
MONO ABS: 1.1 10*3/uL — AB (ref 0.1–1.0)
Monocytes Relative: 7 % (ref 3–12)
Neutro Abs: 12.6 10*3/uL — ABNORMAL HIGH (ref 1.7–7.7)
Neutrophils Relative %: 77 % (ref 43–77)
PLATELETS: 408 10*3/uL — AB (ref 150–400)
RBC: 4.66 MIL/uL (ref 3.87–5.11)
RDW: 12 % (ref 11.5–15.5)
WBC: 16.3 10*3/uL — AB (ref 4.0–10.5)

## 2014-09-02 LAB — TROPONIN I

## 2014-09-02 LAB — I-STAT CG4 LACTIC ACID, ED: Lactic Acid, Venous: 2 mmol/L (ref 0.5–2.0)

## 2014-09-02 LAB — MAGNESIUM: MAGNESIUM: 1.3 mg/dL — AB (ref 1.5–2.5)

## 2014-09-02 MED ORDER — SODIUM CHLORIDE 0.9 % IV BOLUS (SEPSIS)
1000.0000 mL | Freq: Once | INTRAVENOUS | Status: AC
  Administered 2014-09-02: 1000 mL via INTRAVENOUS

## 2014-09-02 MED ORDER — POTASSIUM CHLORIDE ER 10 MEQ PO TBCR: 10.0000 meq | EXTENDED_RELEASE_TABLET | Freq: Two times a day (BID) | ORAL | Status: DC

## 2014-09-02 MED ORDER — DILTIAZEM LOAD VIA INFUSION
10.0000 mg | Freq: Once | INTRAVENOUS | Status: DC
  Filled 2014-09-02: qty 10

## 2014-09-02 MED ORDER — LEVOFLOXACIN 500 MG PO TABS
500.0000 mg | ORAL_TABLET | Freq: Every day | ORAL | Status: DC
Start: 2014-09-02 — End: 2015-08-18

## 2014-09-02 MED ORDER — GUAIFENESIN-CODEINE 100-10 MG/5ML PO SOLN: 5.0000 mL | ORAL | Status: DC | PRN

## 2014-09-02 MED ORDER — POTASSIUM CHLORIDE CRYS ER 20 MEQ PO TBCR
40.0000 meq | EXTENDED_RELEASE_TABLET | Freq: Once | ORAL | Status: AC
  Administered 2014-09-02: 40 meq via ORAL
  Filled 2014-09-02: qty 2

## 2014-09-02 MED ORDER — MAGNESIUM SULFATE 50 % IJ SOLN
INTRAMUSCULAR | Status: AC
  Filled 2014-09-02: qty 2

## 2014-09-02 MED ORDER — DEXTROSE 5 % IV SOLN
1.0000 g | Freq: Once | INTRAVENOUS | Status: AC
  Administered 2014-09-02: 1 g via INTRAVENOUS

## 2014-09-02 MED ORDER — MAGNESIUM SULFATE 2 GM/50ML IV SOLN: 2.0000 g | Freq: Once | INTRAVENOUS | Status: DC

## 2014-09-02 MED ORDER — ADENOSINE 6 MG/2ML IV SOLN
6.0000 mg | Freq: Once | INTRAVENOUS | Status: AC
  Administered 2014-09-02: 6 mg via INTRAVENOUS
  Filled 2014-09-02: qty 2

## 2014-09-02 MED ORDER — DILTIAZEM HCL 100 MG IV SOLR: 5.0000 mg/h | INTRAVENOUS | Status: DC

## 2014-09-02 MED ORDER — CEFTRIAXONE SODIUM 1 G IJ SOLR
INTRAMUSCULAR | Status: AC
  Filled 2014-09-02: qty 10

## 2014-09-02 MED ORDER — ASPIRIN 81 MG PO CHEW
324.0000 mg | CHEWABLE_TABLET | Freq: Once | ORAL | Status: AC
  Administered 2014-09-02: 324 mg via ORAL
  Filled 2014-09-02: qty 4

## 2014-09-02 MED ORDER — DEXTROSE 5 % IV SOLN
500.0000 mg | Freq: Once | INTRAVENOUS | Status: AC
  Administered 2014-09-02: 500 mg via INTRAVENOUS
  Filled 2014-09-02: qty 500

## 2014-09-02 MED ORDER — IBUPROFEN 800 MG PO TABS: 800.0000 mg | ORAL_TABLET | Freq: Three times a day (TID) | ORAL | Status: DC | PRN

## 2014-09-02 MED ORDER — MAGNESIUM SULFATE 2 GM/50ML IV SOLN
INTRAVENOUS | Status: AC
  Filled 2014-09-02: qty 50

## 2014-09-02 MED ORDER — SODIUM CHLORIDE 0.9 % IV SOLN
INTRAVENOUS | Status: DC
  Administered 2014-09-02: 15:00:00 via INTRAVENOUS

## 2014-09-02 MED ORDER — MAGNESIUM SULFATE 50 % IJ SOLN
1.0000 g | Freq: Once | INTRAVENOUS | Status: AC
  Administered 2014-09-02: 1 g via INTRAVENOUS

## 2014-09-02 NOTE — ED Notes (Signed)
MD at bedside to discuss pending dispo home.

## 2014-09-02 NOTE — ED Notes (Signed)
Pt has hx of afib and is c/o increased palpitations x2 days along with cough, congestion and cold symptoms. Pt sent her by UC. Pt sees Dr. Wyline Copas for cardiology.

## 2014-09-02 NOTE — ED Notes (Signed)
Lights dimmed per pt request.  Aware waiting meds to complete for possible d/c home. Denies additional needs.  nad at this time and HR currently in high 90s.

## 2014-09-02 NOTE — ED Notes (Addendum)
Dr Leonides Schanz notified of pt's HR down to 106. EKG repeated per VORB. MD at bedside to re-eval. Diltiazem bolus and drip d/c per Dr Candis Schatz at bedside

## 2014-09-02 NOTE — ED Provider Notes (Signed)
TIME SEEN: 1:50 PM  CHIEF COMPLAINT: Subjective fevers, cough, decreased appetite, palpitations  HPI: Pt is a 64 y.o. female with history of paroxysmal atrial fibrillation, hypertension, Mnire's disease, diabetes, fibromyalgia who presents to the emergency department with complaints of several days of subjective fevers, chills, dry cough, decreased appetite. States today she noticed her heart was racing but denies chest pain or shortness of breath. States she forgot to take her metoprolol and propanolol last night but didn't take them this morning. Denies nausea, vomiting or diarrhea that has had slightly decreased appetite. No known sick contacts or recent travel. States she went to urgent care and they sent her to the emergency department because of her heart rate.  PCP is with [facility] Cardiologist is Dr. Sherrian Divers with [facility]  ROS: See HPI Constitutional:  fever  Eyes: no drainage  ENT: runny nose   Cardiovascular:  no chest pain  Resp: no SOB  GI: no vomiting GU: no dysuria Integumentary: no rash  Allergy: no hives  Musculoskeletal: no leg swelling  Neurological: no slurred speech ROS otherwise negative  PAST MEDICAL HISTORY/PAST SURGICAL HISTORY:  Past Medical History  Diagnosis Date  . Atrial fibrillation   . Obesity   . Diabetes mellitus   . Fibromyalgia   . Hypertension   . GERD (gastroesophageal reflux disease)   . Insomnia   . 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 Vertigo   . Sleep apnea     MEDICATIONS:  Prior to Admission medications   Medication Sig Start Date End Date Taking? Authorizing Provider  omeprazole (PRILOSEC) 10 MG capsule Take 10 mg by mouth daily.   Yes Historical Provider, MD  propranolol ER (INDERAL LA) 60 MG 24 hr capsule Take 60 mg by mouth daily.   Yes Historical Provider, MD  cetirizine (ZYRTEC) 10 MG tablet Take 10 mg by mouth daily.      Historical Provider, MD  dabigatran (PRADAXA) 150 MG CAPS Take 150 mg by mouth  every 12 (twelve) hours.      Historical Provider, MD  esomeprazole (NEXIUM) 40 MG capsule Take 40 mg by mouth daily before breakfast.      Historical Provider, MD  Eszopiclone (ESZOPICLONE) 3 MG TABS Take 3 mg by mouth at bedtime. Take immediately before bedtime     Historical Provider, MD  metFORMIN (GLUMETZA) 1000 MG (MOD) 24 hr tablet Take 1,000 mg by mouth daily with breakfast.      Historical Provider, MD  metoprolol (TOPROL-XL) 50 MG 24 hr tablet Take 25 mg by mouth daily.      Historical Provider, MD  metoprolol succinate (TOPROL-XL) 25 MG 24 hr tablet Take 25 mg by mouth daily.      Historical Provider, MD  simvastatin (ZOCOR) 40 MG tablet Take 40 mg by mouth at bedtime.      Historical Provider, MD  traMADol (ULTRAM) 50 MG tablet Take 50 mg by mouth 2 (two) times daily as needed. For pain    Historical Provider, MD    ALLERGIES:  Allergies  Allergen Reactions  . Penicillins Itching  . Sulfa Drugs Cross Reactors Itching    SOCIAL HISTORY:  History  Substance Use Topics  . Smoking status: Never Smoker   . Smokeless tobacco: Never Used  . Alcohol Use: No    FAMILY HISTORY: Family History  Problem Relation Age of Onset  . Cancer Mother 68    PERITONEAL CANCER    EXAM: BP 143/88 mmHg  Pulse 148  Temp(Src) 98.7 F (37.1 C) (Oral)  Resp 18  Ht 5\' 5"  (1.651 m)  Wt 205 lb (92.987 kg)  BMI 34.11 kg/m2  SpO2 96% CONSTITUTIONAL: Alert and oriented and responds appropriately to questions. Well-appearing; well-nourished man nontoxic, no distress, very pleasant, smiling HEAD: Normocephalic EYES: Conjunctivae clear, PERRL ENT: normal nose; no rhinorrhea; moist mucous membranes; pharynx without lesions noted NECK: Supple, no meningismus, no LAD  CARD: Regular and tachycardic; S1 and S2 appreciated; no murmurs, no clicks, no rubs, no gallops RESP: Normal chest excursion without splinting or tachypnea; breath sounds clear and equal bilaterally; no wheezes, no rhonchi, no rales,  no hypoxia or respiratory distress, speaking full sentences, good aeration diffusely ABD/GI: Normal bowel sounds; non-distended; soft, non-tender, no rebound, no guarding BACK:  The back appears normal and is non-tender to palpation, there is no CVA tenderness EXT: Normal ROM in all joints; non-tender to palpation; no edema; normal capillary refill; no cyanosis    SKIN: Normal color for age and race; warm, extremities warm and well-perfused, no rash NEURO: Moves all extremities equally PSYCH: The patient's mood and manner are appropriate. Grooming and personal hygiene are appropriate.  MEDICAL DECISION MAKING: Patient here with fever, cough or the past several days and now what appears to be atrial flutter with 2-1 conduction. Patient EKG showed SVT versus a flutter. Was given adenosine which slowed her rate and showed flutter waves. Patient was given a liter of IV fluid and we were planning to give her diltiazem with diltiazem drip when she broke back into a sinus tachycardia spontaneously.  Her labs show leukocytosis of 16 with left shift. Her lactate is 2.0. She is afebrile in the emergency department and nontoxic appearing. No respiratory distress or oxygen requirement. Potassium slightly low at 3.3, will replace. Magnesium low at 1.3, will replace. Chest x-ray shows right lower lobe infiltrate. I suspect this is infectious in nature given her recent symptoms of fever, cough and her leukocytosis. Have offered admission the patient states she would like to go home. Will give dose of IV ceftriaxone, azithromycin. We'll ambulate patient with pulse oximeter and monitor heart rate. If heart rate stays controlled and she has no hypoxia will discharge home on antibiotics with close outpatient follow-up. Patient is comfortable with this plan. Discussed return precautions.           EKG Interpretation  Date/Time:  Wednesday September 02 2014 13:23:09 EDT Ventricular Rate:  151 PR Interval:  84 QRS  Duration: 122 QT Interval:  322 QTC Calculation: 510 R Axis:   25 Text Interpretation:  Atrial flutter Non-specific intra-ventricular conduction delay ST \\T \ T wave abnormality, consider inferior ischemia Abnormal ECG Confirmed by Salsabeel Gorelick,  DO, Breanna Mcdaniel [phone]) on 09/02/2014 2:26:38 PM          EKG Interpretation  Date/Time:  Wednesday September 02 2014 14:46:40 EDT Ventricular Rate:  106 PR Interval:  162 QRS Duration: 84 QT Interval:  342 QTC Calculation: 623 R Axis:   57 Text Interpretation:  Sinus tachycardia Otherwise normal ECG Confirmed by Ritamarie Arkin,  DO, Riata Ikeda ([id number]) on 09/02/2014 2:47:58 PM         CRITICAL CARE Performed by: Nyra Jabs   Total critical care time: 35 minutes  Critical care time was exclusive of separately billable procedures and treating other patients. - A flutter with RVR, given adenosine, IVF, pt has CAP  Critical care was necessary to treat or prevent imminent or life-threatening deterioration.  Critical care was time spent  personally by me on the following activities: development of treatment plan with patient and/or surrogate as well as nursing, discussions with consultants, evaluation of patient's response to treatment, examination of patient, obtaining history from patient or surrogate, ordering and performing treatments and interventions, ordering and review of laboratory studies, ordering and review of radiographic studies, pulse oximetry and re-evaluation of patient's condition.   [place], DO 09/02/14 1514

## 2014-09-02 NOTE — ED Provider Notes (Signed)
Ambulated without dropping her sats and having her HR go up. Patient stable for discharge. Has finished her IV antiobiotics and magnesium.  1. CAP (community acquired pneumonia)   2. Atrial flutter with rapid ventricular response      Evelina Bucy, MD 09/02/14 [phone]

## 2014-09-02 NOTE — ED Notes (Addendum)
Dr Leonides Schanz at bedside during administration of adenosine- HR 140s before,160s after- ekg strips placed on chart

## 2014-09-02 NOTE — Discharge Instructions (Signed)
Atrial Flutter Atrial flutter is a heart rhythm that can cause the heart to beat very fast (tachycardia). It originates in the upper chambers of the heart (atria). In atrial flutter, the top chambers of the heart (atria) often beat much faster than the bottom chambers of the heart (ventricles). Atrial flutter has a regular "saw toothed" appearance in an EKG readout. An EKG is a test that records the electrical activity of the heart. Atrial flutter can cause the heart to beat up to 150 beats per minute (BPM). Atrial flutter can either be short lived (paroxysmal) or permanent.  CAUSES  Causes of atrial flutter can be many. Some of these include:  Heart related issues:  Heart attack (myocardial infarction).  Heart failure.  Heart valve problems.  Poorly controlled high blood pressure (hypertension).  After open heart surgery.  Lung related issues:  A blood clot in the lungs (pulmonary embolism).  Chronic obstructive pulmonary disease (COPD). Medications used to treat COPD can attribute to atrial flutter.  Other related causes:  Hyperthyroidism.  Caffeine.  Some decongestant cold medications.  Low electrolyte levels such as potassium or magnesium.  Cocaine. SYMPTOMS  An awareness of your heart beating rapidly (palpitations).  Shortness of breath.  Chest pain.  Low blood pressure (hypotension).  Dizziness or fainting. DIAGNOSIS  Different tests can be performed to diagnose atrial flutter.   An EKG.  Holter monitor. This is a 24-hour recording of your heart rhythm. You will also be given a diary. Write down all symptoms that you have and what you were doing at the time you experienced symptoms.  Cardiac event monitor. This small device can be worn for up to 30 days. When you have heart symptoms, you will push a button on the device. This will then record your heart rhythm.  Echocardiogram. This is an imaging test to look at your heart. Your caregiver will look at your  heart valves and the ventricles.  Stress test. This test can help determine if the atrial flutter is related to exercise or if coronary artery disease is present.  Laboratory studies will look at certain blood levels like:  Complete blood count (CBC).  Potassium.  Magnesium.  Thyroid function. TREATMENT  Treatment of atrial flutter varies. A combination of therapies may be used or sometimes atrial flutter may need only 1 type of treatment.  Lab work: If your blood work, such as your electrolytes (potassium, magnesium) or your thyroid function tests, are abnormal, your caregiver will treat them accordingly.  Medication:  There are several different types of medications that can convert your heart to a normal rhythm and prevent atrial flutter from reoccurring.  Nonsurgical procedures: Nonsurgical techniques may be used to control atrial flutter. Some examples include:  Cardioversion. This technique uses either drugs or an electrical shock to restore a normal heart rhythm:  Cardioversion drugs may be given through an intravenous (IV) line to help "reset" the heart rhythm.  In electrical cardioversion, your caregiver shocks your heart with electrical energy. This helps to reset the heartbeat to a normal rhythm.  Ablation. If atrial flutter is a persistent problem, an ablation may be needed. This procedure is done under mild sedation. High frequency radio-wave energy is used to destroy the area of heart tissue responsible for atrial flutter. SEEK IMMEDIATE MEDICAL CARE IF:  You have:  Dizziness.  Near fainting or fainting.  Shortness of breath.  Chest pain or pressure.  Sudden nausea or vomiting.  Profuse sweating. If you have the above symptoms,  call your local emergency service immediately! Do not drive yourself to the hospital. MAKE SURE YOU:   Understand these instructions.  Will watch your condition.  Will get help right away if you are not doing well or get  worse. Document Released: 10/22/2008 Document Revised: 10/20/2013 Document Reviewed: 10/22/2008 [facility] Patient Information 2015 [place], [place]. This information is not intended to replace advice given to you by your health care provider. Make sure you discuss any questions you have with your health care provider.  Pneumonia Pneumonia is an infection of the lungs.  CAUSES Pneumonia may be caused by bacteria or a virus. Usually, these infections are caused by breathing infectious particles into the lungs (respiratory tract). SIGNS AND SYMPTOMS   Cough.  Fever.  Chest pain.  Increased rate of breathing.  Wheezing.  Mucus production. DIAGNOSIS  If you have the common symptoms of pneumonia, your health care provider will typically confirm the diagnosis with a chest X-ray. The X-ray will show an abnormality in the lung (pulmonary infiltrate) if you have pneumonia. Other tests of your blood, urine, or sputum may be done to find the specific cause of your pneumonia. Your health care provider may also do tests (blood gases or pulse oximetry) to see how well your lungs are working. TREATMENT  Some forms of pneumonia may be spread to other people when you cough or sneeze. You may be asked to wear a mask before and during your exam. Pneumonia that is caused by bacteria is treated with antibiotic medicine. Pneumonia that is caused by the influenza virus may be treated with an antiviral medicine. Most other viral infections must run their course. These infections will not respond to antibiotics.  HOME CARE INSTRUCTIONS   Cough suppressants may be used if you are losing too much rest. However, coughing protects you by clearing your lungs. You should avoid using cough suppressants if you can.  Your health care provider may have prescribed medicine if he or she thinks your pneumonia is caused by bacteria or influenza. Finish your medicine even if you start to feel better.  Your health care provider  may also prescribe an expectorant. This loosens the mucus to be coughed up.  Take medicines only as directed by your health care provider.  Do not smoke. Smoking is a common cause of bronchitis and can contribute to pneumonia. If you are a smoker and continue to smoke, your cough may last several weeks after your pneumonia has cleared.  A cold steam vaporizer or humidifier in your room or home may help loosen mucus.  Coughing is often worse at night. Sleeping in a semi-upright position in a recliner or using a couple pillows under your head will help with this.  Get rest as you feel it is needed. Your body will usually let you know when you need to rest. PREVENTION A pneumococcal shot (vaccine) is available to prevent a common bacterial cause of pneumonia. This is usually suggested for:  People over 9 years old.  Patients on chemotherapy.  People with chronic lung problems, such as bronchitis or emphysema.  People with immune system problems. If you are over 65 or have a high risk condition, you may receive the pneumococcal vaccine if you have not received it before. In some countries, a routine influenza vaccine is also recommended. This vaccine can help prevent some cases of pneumonia.You may be offered the influenza vaccine as part of your care. If you smoke, it is time to quit. You may receive instructions  on how to stop smoking. Your health care provider can provide medicines and counseling to help you quit. SEEK MEDICAL CARE IF: You have a fever. SEEK IMMEDIATE MEDICAL CARE IF:   Your illness becomes worse. This is especially true if you are elderly or weakened from any other disease.  You cannot control your cough with suppressants and are losing sleep.  You begin coughing up blood.  You develop pain which is getting worse or is uncontrolled with medicines.  Any of the symptoms which initially brought you in for treatment are getting worse rather than better.  You  develop shortness of breath or chest pain. MAKE SURE YOU:   Understand these instructions.  Will watch your condition.  Will get help right away if you are not doing well or get worse. Document Released: 06/05/2005 Document Revised: 10/20/2013 Document Reviewed: 08/25/2010 [facility] Patient Information 2015 [place], [place]. This information is not intended to replace advice given to you by your health care provider. Make sure you discuss any questions you have with your health care provider.

## 2014-09-02 NOTE — Progress Notes (Signed)
Pt walked around the ED on room air, Sp02 remained 96% -98% & HR 106.

## 2014-09-02 NOTE — ED Notes (Signed)
D/c home with instructions for f/u- directed to pharmacy to pick up meds- ambulatory with no resp distress at d/c

## 2014-09-03 LAB — TSH

## 2014-09-08 DIAGNOSIS — I10 Essential (primary) hypertension: Secondary | ICD-10-CM | POA: Diagnosis not present

## 2014-09-08 DIAGNOSIS — E876 Hypokalemia: Secondary | ICD-10-CM | POA: Diagnosis not present

## 2014-09-08 DIAGNOSIS — J189 Pneumonia, unspecified organism: Secondary | ICD-10-CM | POA: Diagnosis not present

## 2014-09-30 DIAGNOSIS — M722 Plantar fascial fibromatosis: Secondary | ICD-10-CM | POA: Diagnosis not present

## 2014-09-30 DIAGNOSIS — E1149 Type 2 diabetes mellitus with other diabetic neurological complication: Secondary | ICD-10-CM | POA: Diagnosis not present

## 2014-09-30 DIAGNOSIS — E1140 Type 2 diabetes mellitus with diabetic neuropathy, unspecified: Secondary | ICD-10-CM | POA: Diagnosis not present

## 2014-09-30 DIAGNOSIS — E559 Vitamin D deficiency, unspecified: Secondary | ICD-10-CM | POA: Diagnosis not present

## 2014-09-30 DIAGNOSIS — Z8639 Personal history of other endocrine, nutritional and metabolic disease: Secondary | ICD-10-CM | POA: Diagnosis not present

## 2014-09-30 DIAGNOSIS — R5383 Other fatigue: Secondary | ICD-10-CM | POA: Diagnosis not present

## 2014-09-30 DIAGNOSIS — I10 Essential (primary) hypertension: Secondary | ICD-10-CM | POA: Diagnosis not present

## 2014-09-30 DIAGNOSIS — J309 Allergic rhinitis, unspecified: Secondary | ICD-10-CM | POA: Diagnosis not present

## 2014-10-06 DIAGNOSIS — E119 Type 2 diabetes mellitus without complications: Secondary | ICD-10-CM | POA: Diagnosis not present

## 2014-10-06 DIAGNOSIS — I4891 Unspecified atrial fibrillation: Secondary | ICD-10-CM | POA: Diagnosis not present

## 2014-10-20 DIAGNOSIS — M545 Low back pain: Secondary | ICD-10-CM | POA: Diagnosis not present

## 2014-10-20 DIAGNOSIS — M9912 Subluxation complex (vertebral) of thoracic region: Secondary | ICD-10-CM | POA: Diagnosis not present

## 2014-10-20 DIAGNOSIS — M9903 Segmental and somatic dysfunction of lumbar region: Secondary | ICD-10-CM | POA: Diagnosis not present

## 2014-10-20 DIAGNOSIS — M546 Pain in thoracic spine: Secondary | ICD-10-CM | POA: Diagnosis not present

## 2014-10-22 DIAGNOSIS — I4891 Unspecified atrial fibrillation: Secondary | ICD-10-CM | POA: Diagnosis not present

## 2014-10-22 DIAGNOSIS — Z1231 Encounter for screening mammogram for malignant neoplasm of breast: Secondary | ICD-10-CM | POA: Diagnosis not present

## 2014-10-26 DIAGNOSIS — E559 Vitamin D deficiency, unspecified: Secondary | ICD-10-CM | POA: Diagnosis not present

## 2014-10-26 DIAGNOSIS — E1140 Type 2 diabetes mellitus with diabetic neuropathy, unspecified: Secondary | ICD-10-CM | POA: Diagnosis not present

## 2014-10-26 DIAGNOSIS — E1149 Type 2 diabetes mellitus with other diabetic neurological complication: Secondary | ICD-10-CM | POA: Diagnosis not present

## 2014-10-26 DIAGNOSIS — E785 Hyperlipidemia, unspecified: Secondary | ICD-10-CM | POA: Diagnosis not present

## 2014-10-26 DIAGNOSIS — E1169 Type 2 diabetes mellitus with other specified complication: Secondary | ICD-10-CM | POA: Diagnosis not present

## 2014-10-28 DIAGNOSIS — M545 Low back pain: Secondary | ICD-10-CM | POA: Diagnosis not present

## 2014-10-28 DIAGNOSIS — M546 Pain in thoracic spine: Secondary | ICD-10-CM | POA: Diagnosis not present

## 2014-10-28 DIAGNOSIS — M9903 Segmental and somatic dysfunction of lumbar region: Secondary | ICD-10-CM | POA: Diagnosis not present

## 2014-10-28 DIAGNOSIS — M9912 Subluxation complex (vertebral) of thoracic region: Secondary | ICD-10-CM | POA: Diagnosis not present

## 2014-11-02 DIAGNOSIS — M9903 Segmental and somatic dysfunction of lumbar region: Secondary | ICD-10-CM | POA: Diagnosis not present

## 2014-11-02 DIAGNOSIS — M546 Pain in thoracic spine: Secondary | ICD-10-CM | POA: Diagnosis not present

## 2014-11-02 DIAGNOSIS — M545 Low back pain: Secondary | ICD-10-CM | POA: Diagnosis not present

## 2014-11-02 DIAGNOSIS — M9912 Subluxation complex (vertebral) of thoracic region: Secondary | ICD-10-CM | POA: Diagnosis not present

## 2014-11-04 DIAGNOSIS — M9912 Subluxation complex (vertebral) of thoracic region: Secondary | ICD-10-CM | POA: Diagnosis not present

## 2014-11-04 DIAGNOSIS — M545 Low back pain: Secondary | ICD-10-CM | POA: Diagnosis not present

## 2014-11-04 DIAGNOSIS — M546 Pain in thoracic spine: Secondary | ICD-10-CM | POA: Diagnosis not present

## 2014-11-04 DIAGNOSIS — M9903 Segmental and somatic dysfunction of lumbar region: Secondary | ICD-10-CM | POA: Diagnosis not present

## 2014-11-05 DIAGNOSIS — G2581 Restless legs syndrome: Secondary | ICD-10-CM | POA: Diagnosis not present

## 2014-11-05 DIAGNOSIS — K219 Gastro-esophageal reflux disease without esophagitis: Secondary | ICD-10-CM | POA: Diagnosis not present

## 2014-11-05 DIAGNOSIS — E890 Postprocedural hypothyroidism: Secondary | ICD-10-CM | POA: Diagnosis not present

## 2014-11-05 DIAGNOSIS — E119 Type 2 diabetes mellitus without complications: Secondary | ICD-10-CM | POA: Diagnosis not present

## 2014-11-05 DIAGNOSIS — E612 Magnesium deficiency: Secondary | ICD-10-CM | POA: Diagnosis not present

## 2014-11-05 DIAGNOSIS — R87810 Cervical high risk human papillomavirus (HPV) DNA test positive: Secondary | ICD-10-CM | POA: Diagnosis not present

## 2014-11-05 DIAGNOSIS — G4762 Sleep related leg cramps: Secondary | ICD-10-CM | POA: Diagnosis not present

## 2014-11-05 DIAGNOSIS — I4891 Unspecified atrial fibrillation: Secondary | ICD-10-CM | POA: Diagnosis not present

## 2014-11-05 DIAGNOSIS — E559 Vitamin D deficiency, unspecified: Secondary | ICD-10-CM | POA: Diagnosis not present

## 2014-11-05 DIAGNOSIS — E780 Pure hypercholesterolemia: Secondary | ICD-10-CM | POA: Diagnosis not present

## 2014-11-09 DIAGNOSIS — M9912 Subluxation complex (vertebral) of thoracic region: Secondary | ICD-10-CM | POA: Diagnosis not present

## 2014-11-09 DIAGNOSIS — M546 Pain in thoracic spine: Secondary | ICD-10-CM | POA: Diagnosis not present

## 2014-11-09 DIAGNOSIS — M545 Low back pain: Secondary | ICD-10-CM | POA: Diagnosis not present

## 2014-11-09 DIAGNOSIS — M9903 Segmental and somatic dysfunction of lumbar region: Secondary | ICD-10-CM | POA: Diagnosis not present

## 2014-11-11 DIAGNOSIS — M545 Low back pain: Secondary | ICD-10-CM | POA: Diagnosis not present

## 2014-11-11 DIAGNOSIS — M9912 Subluxation complex (vertebral) of thoracic region: Secondary | ICD-10-CM | POA: Diagnosis not present

## 2014-11-11 DIAGNOSIS — M9903 Segmental and somatic dysfunction of lumbar region: Secondary | ICD-10-CM | POA: Diagnosis not present

## 2014-11-11 DIAGNOSIS — M546 Pain in thoracic spine: Secondary | ICD-10-CM | POA: Diagnosis not present

## 2014-11-18 DIAGNOSIS — M545 Low back pain: Secondary | ICD-10-CM | POA: Diagnosis not present

## 2014-11-18 DIAGNOSIS — M9903 Segmental and somatic dysfunction of lumbar region: Secondary | ICD-10-CM | POA: Diagnosis not present

## 2014-11-18 DIAGNOSIS — M9912 Subluxation complex (vertebral) of thoracic region: Secondary | ICD-10-CM | POA: Diagnosis not present

## 2014-11-18 DIAGNOSIS — M546 Pain in thoracic spine: Secondary | ICD-10-CM | POA: Diagnosis not present

## 2014-11-23 DIAGNOSIS — M545 Low back pain: Secondary | ICD-10-CM | POA: Diagnosis not present

## 2014-11-23 DIAGNOSIS — M9912 Subluxation complex (vertebral) of thoracic region: Secondary | ICD-10-CM | POA: Diagnosis not present

## 2014-11-23 DIAGNOSIS — M9903 Segmental and somatic dysfunction of lumbar region: Secondary | ICD-10-CM | POA: Diagnosis not present

## 2014-11-23 DIAGNOSIS — M546 Pain in thoracic spine: Secondary | ICD-10-CM | POA: Diagnosis not present

## 2014-11-26 DIAGNOSIS — M9912 Subluxation complex (vertebral) of thoracic region: Secondary | ICD-10-CM | POA: Diagnosis not present

## 2014-11-26 DIAGNOSIS — M9903 Segmental and somatic dysfunction of lumbar region: Secondary | ICD-10-CM | POA: Diagnosis not present

## 2014-11-26 DIAGNOSIS — M545 Low back pain: Secondary | ICD-10-CM | POA: Diagnosis not present

## 2014-11-26 DIAGNOSIS — M546 Pain in thoracic spine: Secondary | ICD-10-CM | POA: Diagnosis not present

## 2014-11-30 DIAGNOSIS — M545 Low back pain: Secondary | ICD-10-CM | POA: Diagnosis not present

## 2014-11-30 DIAGNOSIS — M9903 Segmental and somatic dysfunction of lumbar region: Secondary | ICD-10-CM | POA: Diagnosis not present

## 2014-11-30 DIAGNOSIS — M546 Pain in thoracic spine: Secondary | ICD-10-CM | POA: Diagnosis not present

## 2014-11-30 DIAGNOSIS — M9912 Subluxation complex (vertebral) of thoracic region: Secondary | ICD-10-CM | POA: Diagnosis not present

## 2014-12-03 DIAGNOSIS — M9912 Subluxation complex (vertebral) of thoracic region: Secondary | ICD-10-CM | POA: Diagnosis not present

## 2014-12-03 DIAGNOSIS — M545 Low back pain: Secondary | ICD-10-CM | POA: Diagnosis not present

## 2014-12-03 DIAGNOSIS — M546 Pain in thoracic spine: Secondary | ICD-10-CM | POA: Diagnosis not present

## 2014-12-03 DIAGNOSIS — M9903 Segmental and somatic dysfunction of lumbar region: Secondary | ICD-10-CM | POA: Diagnosis not present

## 2014-12-07 DIAGNOSIS — M9912 Subluxation complex (vertebral) of thoracic region: Secondary | ICD-10-CM | POA: Diagnosis not present

## 2014-12-07 DIAGNOSIS — M546 Pain in thoracic spine: Secondary | ICD-10-CM | POA: Diagnosis not present

## 2014-12-07 DIAGNOSIS — M545 Low back pain: Secondary | ICD-10-CM | POA: Diagnosis not present

## 2014-12-07 DIAGNOSIS — M9903 Segmental and somatic dysfunction of lumbar region: Secondary | ICD-10-CM | POA: Diagnosis not present

## 2014-12-09 DIAGNOSIS — E612 Magnesium deficiency: Secondary | ICD-10-CM | POA: Diagnosis not present

## 2014-12-09 DIAGNOSIS — G2581 Restless legs syndrome: Secondary | ICD-10-CM | POA: Diagnosis not present

## 2014-12-09 DIAGNOSIS — K219 Gastro-esophageal reflux disease without esophagitis: Secondary | ICD-10-CM | POA: Diagnosis not present

## 2014-12-09 DIAGNOSIS — E780 Pure hypercholesterolemia: Secondary | ICD-10-CM | POA: Diagnosis not present

## 2014-12-09 DIAGNOSIS — R87810 Cervical high risk human papillomavirus (HPV) DNA test positive: Secondary | ICD-10-CM | POA: Diagnosis not present

## 2014-12-09 DIAGNOSIS — G4762 Sleep related leg cramps: Secondary | ICD-10-CM | POA: Diagnosis not present

## 2014-12-09 DIAGNOSIS — I4891 Unspecified atrial fibrillation: Secondary | ICD-10-CM | POA: Diagnosis not present

## 2014-12-09 DIAGNOSIS — E890 Postprocedural hypothyroidism: Secondary | ICD-10-CM | POA: Diagnosis not present

## 2014-12-09 DIAGNOSIS — E119 Type 2 diabetes mellitus without complications: Secondary | ICD-10-CM | POA: Diagnosis not present

## 2014-12-09 DIAGNOSIS — E559 Vitamin D deficiency, unspecified: Secondary | ICD-10-CM | POA: Diagnosis not present

## 2014-12-14 DIAGNOSIS — M546 Pain in thoracic spine: Secondary | ICD-10-CM | POA: Diagnosis not present

## 2014-12-14 DIAGNOSIS — M545 Low back pain: Secondary | ICD-10-CM | POA: Diagnosis not present

## 2014-12-14 DIAGNOSIS — M9912 Subluxation complex (vertebral) of thoracic region: Secondary | ICD-10-CM | POA: Diagnosis not present

## 2014-12-14 DIAGNOSIS — M9903 Segmental and somatic dysfunction of lumbar region: Secondary | ICD-10-CM | POA: Diagnosis not present

## 2014-12-17 DIAGNOSIS — M722 Plantar fascial fibromatosis: Secondary | ICD-10-CM | POA: Diagnosis not present

## 2014-12-17 DIAGNOSIS — E119 Type 2 diabetes mellitus without complications: Secondary | ICD-10-CM | POA: Diagnosis not present

## 2014-12-17 DIAGNOSIS — K219 Gastro-esophageal reflux disease without esophagitis: Secondary | ICD-10-CM | POA: Diagnosis not present

## 2014-12-17 DIAGNOSIS — E559 Vitamin D deficiency, unspecified: Secondary | ICD-10-CM | POA: Diagnosis not present

## 2014-12-17 DIAGNOSIS — M9912 Subluxation complex (vertebral) of thoracic region: Secondary | ICD-10-CM | POA: Diagnosis not present

## 2014-12-17 DIAGNOSIS — M545 Low back pain: Secondary | ICD-10-CM | POA: Diagnosis not present

## 2014-12-17 DIAGNOSIS — R87810 Cervical high risk human papillomavirus (HPV) DNA test positive: Secondary | ICD-10-CM | POA: Diagnosis not present

## 2014-12-17 DIAGNOSIS — J45909 Unspecified asthma, uncomplicated: Secondary | ICD-10-CM | POA: Diagnosis not present

## 2014-12-17 DIAGNOSIS — G2581 Restless legs syndrome: Secondary | ICD-10-CM | POA: Diagnosis not present

## 2014-12-17 DIAGNOSIS — J329 Chronic sinusitis, unspecified: Secondary | ICD-10-CM | POA: Diagnosis not present

## 2014-12-17 DIAGNOSIS — E612 Magnesium deficiency: Secondary | ICD-10-CM | POA: Diagnosis not present

## 2014-12-17 DIAGNOSIS — I4891 Unspecified atrial fibrillation: Secondary | ICD-10-CM | POA: Diagnosis not present

## 2014-12-17 DIAGNOSIS — M546 Pain in thoracic spine: Secondary | ICD-10-CM | POA: Diagnosis not present

## 2014-12-17 DIAGNOSIS — J310 Chronic rhinitis: Secondary | ICD-10-CM | POA: Diagnosis not present

## 2014-12-17 DIAGNOSIS — M9903 Segmental and somatic dysfunction of lumbar region: Secondary | ICD-10-CM | POA: Diagnosis not present

## 2014-12-22 DIAGNOSIS — M546 Pain in thoracic spine: Secondary | ICD-10-CM | POA: Diagnosis not present

## 2014-12-22 DIAGNOSIS — M545 Low back pain: Secondary | ICD-10-CM | POA: Diagnosis not present

## 2014-12-22 DIAGNOSIS — M9912 Subluxation complex (vertebral) of thoracic region: Secondary | ICD-10-CM | POA: Diagnosis not present

## 2014-12-22 DIAGNOSIS — M9903 Segmental and somatic dysfunction of lumbar region: Secondary | ICD-10-CM | POA: Diagnosis not present

## 2014-12-24 DIAGNOSIS — M545 Low back pain: Secondary | ICD-10-CM | POA: Diagnosis not present

## 2014-12-24 DIAGNOSIS — M9912 Subluxation complex (vertebral) of thoracic region: Secondary | ICD-10-CM | POA: Diagnosis not present

## 2014-12-24 DIAGNOSIS — M546 Pain in thoracic spine: Secondary | ICD-10-CM | POA: Diagnosis not present

## 2014-12-24 DIAGNOSIS — M9903 Segmental and somatic dysfunction of lumbar region: Secondary | ICD-10-CM | POA: Diagnosis not present

## 2014-12-25 DIAGNOSIS — M797 Fibromyalgia: Secondary | ICD-10-CM | POA: Diagnosis not present

## 2014-12-25 DIAGNOSIS — G4730 Sleep apnea, unspecified: Secondary | ICD-10-CM | POA: Diagnosis not present

## 2014-12-25 DIAGNOSIS — R42 Dizziness and giddiness: Secondary | ICD-10-CM | POA: Diagnosis not present

## 2014-12-25 DIAGNOSIS — J3489 Other specified disorders of nose and nasal sinuses: Secondary | ICD-10-CM | POA: Diagnosis not present

## 2014-12-25 DIAGNOSIS — K219 Gastro-esophageal reflux disease without esophagitis: Secondary | ICD-10-CM | POA: Diagnosis not present

## 2014-12-25 DIAGNOSIS — H8103 Meniere's disease, bilateral: Secondary | ICD-10-CM | POA: Diagnosis not present

## 2014-12-29 DIAGNOSIS — M9912 Subluxation complex (vertebral) of thoracic region: Secondary | ICD-10-CM | POA: Diagnosis not present

## 2014-12-29 DIAGNOSIS — M9903 Segmental and somatic dysfunction of lumbar region: Secondary | ICD-10-CM | POA: Diagnosis not present

## 2014-12-29 DIAGNOSIS — M546 Pain in thoracic spine: Secondary | ICD-10-CM | POA: Diagnosis not present

## 2014-12-29 DIAGNOSIS — M545 Low back pain: Secondary | ICD-10-CM | POA: Diagnosis not present

## 2014-12-31 DIAGNOSIS — M545 Low back pain: Secondary | ICD-10-CM | POA: Diagnosis not present

## 2014-12-31 DIAGNOSIS — M546 Pain in thoracic spine: Secondary | ICD-10-CM | POA: Diagnosis not present

## 2014-12-31 DIAGNOSIS — M9903 Segmental and somatic dysfunction of lumbar region: Secondary | ICD-10-CM | POA: Diagnosis not present

## 2014-12-31 DIAGNOSIS — M9912 Subluxation complex (vertebral) of thoracic region: Secondary | ICD-10-CM | POA: Diagnosis not present

## 2015-01-06 DIAGNOSIS — M545 Low back pain: Secondary | ICD-10-CM | POA: Diagnosis not present

## 2015-01-06 DIAGNOSIS — M9903 Segmental and somatic dysfunction of lumbar region: Secondary | ICD-10-CM | POA: Diagnosis not present

## 2015-01-06 DIAGNOSIS — M546 Pain in thoracic spine: Secondary | ICD-10-CM | POA: Diagnosis not present

## 2015-01-06 DIAGNOSIS — M9912 Subluxation complex (vertebral) of thoracic region: Secondary | ICD-10-CM | POA: Diagnosis not present

## 2015-01-07 DIAGNOSIS — M9912 Subluxation complex (vertebral) of thoracic region: Secondary | ICD-10-CM | POA: Diagnosis not present

## 2015-01-07 DIAGNOSIS — M9903 Segmental and somatic dysfunction of lumbar region: Secondary | ICD-10-CM | POA: Diagnosis not present

## 2015-01-07 DIAGNOSIS — M545 Low back pain: Secondary | ICD-10-CM | POA: Diagnosis not present

## 2015-01-07 DIAGNOSIS — M546 Pain in thoracic spine: Secondary | ICD-10-CM | POA: Diagnosis not present

## 2015-01-14 DIAGNOSIS — M9903 Segmental and somatic dysfunction of lumbar region: Secondary | ICD-10-CM | POA: Diagnosis not present

## 2015-01-14 DIAGNOSIS — M546 Pain in thoracic spine: Secondary | ICD-10-CM | POA: Diagnosis not present

## 2015-01-14 DIAGNOSIS — M545 Low back pain: Secondary | ICD-10-CM | POA: Diagnosis not present

## 2015-01-14 DIAGNOSIS — M9912 Subluxation complex (vertebral) of thoracic region: Secondary | ICD-10-CM | POA: Diagnosis not present

## 2015-01-21 DIAGNOSIS — M546 Pain in thoracic spine: Secondary | ICD-10-CM | POA: Diagnosis not present

## 2015-01-21 DIAGNOSIS — M545 Low back pain: Secondary | ICD-10-CM | POA: Diagnosis not present

## 2015-01-21 DIAGNOSIS — M9912 Subluxation complex (vertebral) of thoracic region: Secondary | ICD-10-CM | POA: Diagnosis not present

## 2015-01-21 DIAGNOSIS — M9903 Segmental and somatic dysfunction of lumbar region: Secondary | ICD-10-CM | POA: Diagnosis not present

## 2015-01-26 DIAGNOSIS — M9903 Segmental and somatic dysfunction of lumbar region: Secondary | ICD-10-CM | POA: Diagnosis not present

## 2015-01-26 DIAGNOSIS — M546 Pain in thoracic spine: Secondary | ICD-10-CM | POA: Diagnosis not present

## 2015-01-26 DIAGNOSIS — M545 Low back pain: Secondary | ICD-10-CM | POA: Diagnosis not present

## 2015-01-26 DIAGNOSIS — M9912 Subluxation complex (vertebral) of thoracic region: Secondary | ICD-10-CM | POA: Diagnosis not present

## 2015-02-04 DIAGNOSIS — M9912 Subluxation complex (vertebral) of thoracic region: Secondary | ICD-10-CM | POA: Diagnosis not present

## 2015-02-04 DIAGNOSIS — M545 Low back pain: Secondary | ICD-10-CM | POA: Diagnosis not present

## 2015-02-04 DIAGNOSIS — M9903 Segmental and somatic dysfunction of lumbar region: Secondary | ICD-10-CM | POA: Diagnosis not present

## 2015-02-04 DIAGNOSIS — M546 Pain in thoracic spine: Secondary | ICD-10-CM | POA: Diagnosis not present

## 2015-02-09 DIAGNOSIS — M9903 Segmental and somatic dysfunction of lumbar region: Secondary | ICD-10-CM | POA: Diagnosis not present

## 2015-02-09 DIAGNOSIS — M545 Low back pain: Secondary | ICD-10-CM | POA: Diagnosis not present

## 2015-02-09 DIAGNOSIS — M546 Pain in thoracic spine: Secondary | ICD-10-CM | POA: Diagnosis not present

## 2015-02-09 DIAGNOSIS — M9912 Subluxation complex (vertebral) of thoracic region: Secondary | ICD-10-CM | POA: Diagnosis not present

## 2015-02-11 DIAGNOSIS — M9912 Subluxation complex (vertebral) of thoracic region: Secondary | ICD-10-CM | POA: Diagnosis not present

## 2015-02-11 DIAGNOSIS — M546 Pain in thoracic spine: Secondary | ICD-10-CM | POA: Diagnosis not present

## 2015-02-11 DIAGNOSIS — M9903 Segmental and somatic dysfunction of lumbar region: Secondary | ICD-10-CM | POA: Diagnosis not present

## 2015-02-11 DIAGNOSIS — M545 Low back pain: Secondary | ICD-10-CM | POA: Diagnosis not present

## 2015-02-16 DIAGNOSIS — M9903 Segmental and somatic dysfunction of lumbar region: Secondary | ICD-10-CM | POA: Diagnosis not present

## 2015-02-16 DIAGNOSIS — M546 Pain in thoracic spine: Secondary | ICD-10-CM | POA: Diagnosis not present

## 2015-02-16 DIAGNOSIS — M9912 Subluxation complex (vertebral) of thoracic region: Secondary | ICD-10-CM | POA: Diagnosis not present

## 2015-02-16 DIAGNOSIS — M545 Low back pain: Secondary | ICD-10-CM | POA: Diagnosis not present

## 2015-02-18 DIAGNOSIS — M9903 Segmental and somatic dysfunction of lumbar region: Secondary | ICD-10-CM | POA: Diagnosis not present

## 2015-02-18 DIAGNOSIS — M545 Low back pain: Secondary | ICD-10-CM | POA: Diagnosis not present

## 2015-02-18 DIAGNOSIS — M9912 Subluxation complex (vertebral) of thoracic region: Secondary | ICD-10-CM | POA: Diagnosis not present

## 2015-02-18 DIAGNOSIS — M546 Pain in thoracic spine: Secondary | ICD-10-CM | POA: Diagnosis not present

## 2015-03-09 DIAGNOSIS — J019 Acute sinusitis, unspecified: Secondary | ICD-10-CM | POA: Diagnosis not present

## 2015-04-22 DIAGNOSIS — H02831 Dermatochalasis of right upper eyelid: Secondary | ICD-10-CM | POA: Diagnosis not present

## 2015-04-22 DIAGNOSIS — H04123 Dry eye syndrome of bilateral lacrimal glands: Secondary | ICD-10-CM | POA: Diagnosis not present

## 2015-04-22 DIAGNOSIS — H2513 Age-related nuclear cataract, bilateral: Secondary | ICD-10-CM | POA: Diagnosis not present

## 2015-04-22 DIAGNOSIS — E119 Type 2 diabetes mellitus without complications: Secondary | ICD-10-CM | POA: Diagnosis not present

## 2015-04-22 DIAGNOSIS — H02834 Dermatochalasis of left upper eyelid: Secondary | ICD-10-CM | POA: Diagnosis not present

## 2015-04-22 DIAGNOSIS — H43393 Other vitreous opacities, bilateral: Secondary | ICD-10-CM | POA: Diagnosis not present

## 2015-04-29 DIAGNOSIS — Z01419 Encounter for gynecological examination (general) (routine) without abnormal findings: Secondary | ICD-10-CM | POA: Diagnosis not present

## 2015-04-29 DIAGNOSIS — Z124 Encounter for screening for malignant neoplasm of cervix: Secondary | ICD-10-CM | POA: Diagnosis not present

## 2015-04-29 DIAGNOSIS — Z87898 Personal history of other specified conditions: Secondary | ICD-10-CM | POA: Diagnosis not present

## 2015-05-04 DIAGNOSIS — G4733 Obstructive sleep apnea (adult) (pediatric): Secondary | ICD-10-CM | POA: Diagnosis not present

## 2015-05-04 DIAGNOSIS — J45909 Unspecified asthma, uncomplicated: Secondary | ICD-10-CM | POA: Diagnosis not present

## 2015-05-04 DIAGNOSIS — Z7951 Long term (current) use of inhaled steroids: Secondary | ICD-10-CM | POA: Diagnosis not present

## 2015-05-04 DIAGNOSIS — I10 Essential (primary) hypertension: Secondary | ICD-10-CM | POA: Diagnosis not present

## 2015-05-04 DIAGNOSIS — R42 Dizziness and giddiness: Secondary | ICD-10-CM | POA: Diagnosis not present

## 2015-05-04 DIAGNOSIS — Z7984 Long term (current) use of oral hypoglycemic drugs: Secondary | ICD-10-CM | POA: Diagnosis not present

## 2015-05-04 DIAGNOSIS — E119 Type 2 diabetes mellitus without complications: Secondary | ICD-10-CM | POA: Diagnosis not present

## 2015-05-04 DIAGNOSIS — Z79899 Other long term (current) drug therapy: Secondary | ICD-10-CM | POA: Diagnosis not present

## 2015-06-09 DIAGNOSIS — M797 Fibromyalgia: Secondary | ICD-10-CM | POA: Diagnosis not present

## 2015-06-09 DIAGNOSIS — E1140 Type 2 diabetes mellitus with diabetic neuropathy, unspecified: Secondary | ICD-10-CM | POA: Diagnosis not present

## 2015-06-09 DIAGNOSIS — Z7984 Long term (current) use of oral hypoglycemic drugs: Secondary | ICD-10-CM | POA: Diagnosis not present

## 2015-06-09 DIAGNOSIS — M545 Low back pain: Secondary | ICD-10-CM | POA: Diagnosis not present

## 2015-06-09 DIAGNOSIS — R87810 Cervical high risk human papillomavirus (HPV) DNA test positive: Secondary | ICD-10-CM | POA: Diagnosis not present

## 2015-06-09 DIAGNOSIS — G2581 Restless legs syndrome: Secondary | ICD-10-CM | POA: Diagnosis not present

## 2015-06-09 DIAGNOSIS — E612 Magnesium deficiency: Secondary | ICD-10-CM | POA: Diagnosis not present

## 2015-06-09 DIAGNOSIS — E119 Type 2 diabetes mellitus without complications: Secondary | ICD-10-CM | POA: Diagnosis not present

## 2015-06-09 DIAGNOSIS — J45909 Unspecified asthma, uncomplicated: Secondary | ICD-10-CM | POA: Diagnosis not present

## 2015-06-09 DIAGNOSIS — I4891 Unspecified atrial fibrillation: Secondary | ICD-10-CM | POA: Diagnosis not present

## 2015-06-09 DIAGNOSIS — E559 Vitamin D deficiency, unspecified: Secondary | ICD-10-CM | POA: Diagnosis not present

## 2015-06-09 DIAGNOSIS — E7800 Pure hypercholesterolemia, unspecified: Secondary | ICD-10-CM | POA: Diagnosis not present

## 2015-06-15 DIAGNOSIS — J019 Acute sinusitis, unspecified: Secondary | ICD-10-CM | POA: Diagnosis not present

## 2015-06-23 DIAGNOSIS — J019 Acute sinusitis, unspecified: Secondary | ICD-10-CM | POA: Diagnosis not present

## 2015-07-22 DIAGNOSIS — H02831 Dermatochalasis of right upper eyelid: Secondary | ICD-10-CM | POA: Diagnosis not present

## 2015-07-22 DIAGNOSIS — H04123 Dry eye syndrome of bilateral lacrimal glands: Secondary | ICD-10-CM | POA: Diagnosis not present

## 2015-07-22 DIAGNOSIS — H02834 Dermatochalasis of left upper eyelid: Secondary | ICD-10-CM | POA: Diagnosis not present

## 2015-07-22 DIAGNOSIS — E119 Type 2 diabetes mellitus without complications: Secondary | ICD-10-CM | POA: Diagnosis not present

## 2015-07-22 DIAGNOSIS — H43393 Other vitreous opacities, bilateral: Secondary | ICD-10-CM | POA: Diagnosis not present

## 2015-07-22 DIAGNOSIS — H2513 Age-related nuclear cataract, bilateral: Secondary | ICD-10-CM | POA: Diagnosis not present

## 2015-07-26 DIAGNOSIS — H00011 Hordeolum externum right upper eyelid: Secondary | ICD-10-CM | POA: Diagnosis not present

## 2015-08-05 ENCOUNTER — Other Ambulatory Visit: Payer: Self-pay | Admitting: Gastroenterology

## 2015-08-06 ENCOUNTER — Encounter (HOSPITAL_COMMUNITY): Payer: Self-pay | Admitting: *Deleted

## 2015-08-13 NOTE — Progress Notes (Signed)
12/20/2011-Echo on chart from [facility] 10/22/2014-EKG on chart from [facility] 10/06/2014-LOV from Dr. Assunta Curtis on chart. 12/20/2011-Stress test on chart from [facility]

## 2015-08-16 ENCOUNTER — Encounter (HOSPITAL_COMMUNITY): Payer: Self-pay

## 2015-08-16 ENCOUNTER — Ambulatory Visit (HOSPITAL_COMMUNITY): Payer: Medicare Other | Admitting: Certified Registered Nurse Anesthetist

## 2015-08-16 ENCOUNTER — Ambulatory Visit (HOSPITAL_COMMUNITY)
Admission: RE | Admit: 2015-08-16 | Discharge: 2015-08-16 | Disposition: A | Discharge status: Home / Self Care | Payer: Medicare Other | Source: Ambulatory Visit | Attending: Gastroenterology | Admitting: Gastroenterology

## 2015-08-16 ENCOUNTER — Encounter (HOSPITAL_COMMUNITY)
Admission: RE | Disposition: A | Discharge status: Home / Self Care | Payer: Self-pay | Source: Ambulatory Visit | Attending: Gastroenterology

## 2015-08-16 DIAGNOSIS — Z6834 Body mass index (BMI) 34.0-34.9, adult: Secondary | ICD-10-CM | POA: Insufficient documentation

## 2015-08-16 DIAGNOSIS — K219 Gastro-esophageal reflux disease without esophagitis: Secondary | ICD-10-CM | POA: Diagnosis not present

## 2015-08-16 DIAGNOSIS — R6881 Early satiety: Secondary | ICD-10-CM | POA: Diagnosis not present

## 2015-08-16 DIAGNOSIS — G709 Myoneural disorder, unspecified: Secondary | ICD-10-CM | POA: Diagnosis not present

## 2015-08-16 DIAGNOSIS — E119 Type 2 diabetes mellitus without complications: Secondary | ICD-10-CM | POA: Diagnosis not present

## 2015-08-16 DIAGNOSIS — I4891 Unspecified atrial fibrillation: Secondary | ICD-10-CM | POA: Diagnosis not present

## 2015-08-16 DIAGNOSIS — G4730 Sleep apnea, unspecified: Secondary | ICD-10-CM | POA: Insufficient documentation

## 2015-08-16 DIAGNOSIS — I10 Essential (primary) hypertension: Secondary | ICD-10-CM | POA: Insufficient documentation

## 2015-08-16 DIAGNOSIS — R12 Heartburn: Secondary | ICD-10-CM | POA: Insufficient documentation

## 2015-08-16 DIAGNOSIS — Z8711 Personal history of peptic ulcer disease: Secondary | ICD-10-CM | POA: Insufficient documentation

## 2015-08-16 DIAGNOSIS — K319 Disease of stomach and duodenum, unspecified: Secondary | ICD-10-CM | POA: Diagnosis not present

## 2015-08-16 DIAGNOSIS — K317 Polyp of stomach and duodenum: Secondary | ICD-10-CM | POA: Insufficient documentation

## 2015-08-16 HISTORY — DX: Cardiac arrhythmia, unspecified: I49.9

## 2015-08-16 HISTORY — DX: Allergy status to unspecified drugs, medicaments and biological substances: Z88.9

## 2015-08-16 HISTORY — PX: ESOPHAGOGASTRODUODENOSCOPY (EGD) WITH PROPOFOL: SHX5813

## 2015-08-16 LAB — GLUCOSE, CAPILLARY: GLUCOSE-CAPILLARY: 129 mg/dL — AB (ref 65–99)

## 2015-08-16 SURGERY — ESOPHAGOGASTRODUODENOSCOPY (EGD) WITH PROPOFOL
Anesthesia: Monitor Anesthesia Care

## 2015-08-16 MED ORDER — SODIUM CHLORIDE 0.9 % IV SOLN: INTRAVENOUS | Status: DC

## 2015-08-16 MED ORDER — PROPOFOL 500 MG/50ML IV EMUL
INTRAVENOUS | Status: DC | PRN
  Administered 2015-08-16: 100 ug/kg/min via INTRAVENOUS

## 2015-08-16 MED ORDER — BUTAMBEN-TETRACAINE-BENZOCAINE 2-2-14 % EX AERO
INHALATION_SPRAY | CUTANEOUS | Status: DC | PRN
  Administered 2015-08-16: 1 via TOPICAL

## 2015-08-16 MED ORDER — PROPOFOL 10 MG/ML IV BOLUS
INTRAVENOUS | Status: AC
  Filled 2015-08-16: qty 40

## 2015-08-16 MED ORDER — LACTATED RINGERS IV SOLN
INTRAVENOUS | Status: DC
  Administered 2015-08-16: 1000 mL via INTRAVENOUS

## 2015-08-16 MED ORDER — PROPOFOL 500 MG/50ML IV EMUL
INTRAVENOUS | Status: DC | PRN
  Administered 2015-08-16: 30 mg via INTRAVENOUS

## 2015-08-16 SURGICAL SUPPLY — 15 items

## 2015-08-16 NOTE — Op Note (Signed)
Problem: Chronic heartburn, early satiety, and history of peptic ulcer disease. 08/08/1995 normal screening colonoscopy was performed. 11/30/2004 normal esophagogastroduodenoscopy was performed. 05/17/2006 normal screening colonoscopy was performed. 05/02/2011 normal screening colonoscopy was performed. Schedule repeat screening colonoscopy in November 2022.  Endoscopist: Earle Gell  Premedication: Propofol administered by anesthesia  Procedure: The patient was placed in the left lateral decubitus position. The Pentax gastroscope was passed through the posterior hypopharynx and into the esophagus without difficulty. The hypopharynx, larynx, and vocal cords appeared normal.  Esophagoscopy: The proximal, mid, and lower segments of the esophageal mucosa appeared normal. The squamocolumnar junction appeared regular and was noted at 40 cm from the incisor teeth. There was no endoscopic evidence for the presence of erosive esophagitis or Barrett's esophagus.  Gastroscopy: Retroflex view of the gastric cardia and fundus was normal. The gastric body, antrum, and pylorus appeared normal. Random gastric biopsies were taken from the gastric antrum, incisura, gastric body, and gastric cardia to look for H. pylori gastritis.  Duodenoscopy: The duodenal bulb and descending duodenum appeared normal.  Assessment: Normal esophagogastroduodenoscopy. Screen for H. pylori gastritis pending.  Recommendation: Consider scheduling the patient for a nuclear medicine gastric emptying study to look for diabetic gastroparesis.

## 2015-08-16 NOTE — Anesthesia Preprocedure Evaluation (Signed)
Anesthesia Evaluation  Patient identified by MRN, date of birth, ID band Patient awake    Reviewed: Allergy & Precautions, NPO status , Patient's Chart, lab work & pertinent test results  History of Anesthesia Complications Negative for: history of anesthetic complications  Airway Mallampati: II  TM Distance: >3 FB Neck ROM: Full    Dental  (+) Teeth Intact   Pulmonary sleep apnea ,    breath sounds clear to auscultation       Cardiovascular hypertension, Pt. on medications (-) angina(-) Past MI and (-) CHF + dysrhythmias Atrial Fibrillation  Rhythm:Regular     Neuro/Psych neg Seizures  Neuromuscular disease negative psych ROS   GI/Hepatic GERD  Medicated,  Endo/Other  diabetes, Type 2Morbid obesity  Renal/GU      Musculoskeletal   Abdominal   Peds  Hematology   Anesthesia Other Findings   Reproductive/Obstetrics                             Anesthesia Physical Anesthesia Plan  ASA: III  Anesthesia Plan: MAC   Post-op Pain Management:    Induction: Intravenous  Airway Management Planned: Natural Airway, Nasal Cannula and Simple Face Mask  Additional Equipment:   Intra-op Plan:   Post-operative Plan:   Informed Consent: I have reviewed the patients History and Physical, chart, labs and discussed the procedure including the risks, benefits and alternatives for the proposed anesthesia with the patient or authorized representative who has indicated his/her understanding and acceptance.   Dental advisory given  Plan Discussed with: CRNA  Anesthesia Plan Comments:         Anesthesia Quick Evaluation

## 2015-08-16 NOTE — H&P (Signed)
  Problem: Chronic heartburn, early satiety, and history of peptic ulcer disease. 06/06/1996 normal screening colonoscopy was performed. 11/30/2004 normal esophagogastroduodenoscopy was performed. 05/17/2006 normal screening colonoscopy was performed. 05/02/2011 normal screening colonoscopy was performed. Schedule repeat screening colonoscopy in November 2022.  History: The patient is a 65 year old female born [birth year]. She has chronic gastroesophageal reflux treated with proton pump inhibitor therapy. When she was switched from proton pump inhibitor therapy to histamine blocker therapy, she suffered with frequent breakthrough heartburn for 3 weeks unassociated with dysphagia. When she was switched back to proton pump inhibitor therapy, her heartburn resolved.  She is experiencing early satiety and has a history of peptic ulcer disease treated in the past. She takes aspirin on a daily basis.  She is scheduled to undergo diagnostic esophagogastroduodenoscopy and screening for H. pylori gastritis.  Past medical history: Paroxysmal atrial fibrillation. Type 2 diabetes mellitus. Hypertension. Gastroesophageal reflux. Seasonal allergies. Obstructive sleep apnea syndrome. Thyroidectomy. Right eardrum repair surgery. Ganglion cyst surgery of the wrist. Right arthroscopic shoulder surgery. Appendectomy.  Medication allergies: Sulfa and penicillin.  Exam: The patient is alert and lying comfortably on the endoscopy stretcher. Abdomen is soft and nontender to palpation. Lungs are clear to auscultation. Cardiac exam reveals a regular rhythm.  Plan: Proceed with diagnostic esophagogastroduodenoscopy and screening for H. pylori gastritis

## 2015-08-16 NOTE — Discharge Instructions (Signed)

## 2015-08-16 NOTE — Transfer of Care (Signed)
Immediate Anesthesia Transfer of Care Note  Patient: Suzanne Greer  Procedure(s) Performed: Procedure(s): ESOPHAGOGASTRODUODENOSCOPY (EGD) WITH PROPOFOL (N/A)  Patient Location: PACU  Anesthesia Type:MAC  Level of Consciousness: awake, alert  and oriented  Airway & Oxygen Therapy: Patient Spontanous Breathing and Patient connected to nasal cannula oxygen  Post-op Assessment: Report given to RN and Post -op Vital signs reviewed and stable  Post vital signs: Reviewed and stable  Last Vitals:  Filed Vitals:   08/16/15 0619  BP: 122/50  Pulse: 73  Temp: 36.5 C  Resp: 18    Complications: No apparent anesthesia complications

## 2015-08-17 ENCOUNTER — Encounter (HOSPITAL_COMMUNITY): Payer: Self-pay | Admitting: Gastroenterology

## 2015-08-22 NOTE — Anesthesia Postprocedure Evaluation (Signed)
Anesthesia Post Note  Patient: Suzanne Greer  Procedure(s) Performed: Procedure(s) (LRB): ESOPHAGOGASTRODUODENOSCOPY (EGD) WITH PROPOFOL (N/A)  Patient location during evaluation: Endoscopy Anesthesia Type: MAC Level of consciousness: awake Pain management: pain level controlled Vital Signs Assessment: post-procedure vital signs reviewed and stable Respiratory status: spontaneous breathing and respiratory function stable Cardiovascular status: stable Postop Assessment: no signs of nausea or vomiting Anesthetic complications: no    Last Vitals:  Filed Vitals:   08/16/15 0750 08/16/15 0755  BP:    Pulse: 64 63  Temp:    Resp: 20 17    Last Pain: There were no vitals filed for this visit.               Riyad Keena

## 2015-08-24 ENCOUNTER — Other Ambulatory Visit (HOSPITAL_COMMUNITY): Payer: Self-pay | Admitting: Gastroenterology

## 2015-08-24 DIAGNOSIS — R12 Heartburn: Secondary | ICD-10-CM

## 2015-08-24 DIAGNOSIS — R6881 Early satiety: Secondary | ICD-10-CM

## 2015-08-31 ENCOUNTER — Encounter (HOSPITAL_COMMUNITY): Payer: Medicare Other

## 2015-09-10 ENCOUNTER — Encounter (HOSPITAL_COMMUNITY)
Admission: RE | Admit: 2015-09-10 | Discharge: 2015-09-10 | Disposition: A | Discharge status: Home / Self Care | Payer: Medicare Other | Source: Ambulatory Visit | Attending: Gastroenterology | Admitting: Gastroenterology

## 2015-09-10 DIAGNOSIS — R6881 Early satiety: Secondary | ICD-10-CM

## 2015-09-10 DIAGNOSIS — R12 Heartburn: Secondary | ICD-10-CM | POA: Insufficient documentation

## 2015-09-23 DIAGNOSIS — Y31XXXA Falling, lying or running before or into moving object, undetermined intent, initial encounter: Secondary | ICD-10-CM | POA: Diagnosis not present

## 2015-09-23 DIAGNOSIS — M545 Low back pain: Secondary | ICD-10-CM | POA: Diagnosis not present

## 2015-09-23 DIAGNOSIS — S8002XA Contusion of left knee, initial encounter: Secondary | ICD-10-CM | POA: Diagnosis not present

## 2015-10-05 DIAGNOSIS — M1712 Unilateral primary osteoarthritis, left knee: Secondary | ICD-10-CM | POA: Diagnosis not present

## 2015-10-05 DIAGNOSIS — S8002XA Contusion of left knee, initial encounter: Secondary | ICD-10-CM | POA: Diagnosis not present

## 2015-10-13 DIAGNOSIS — I480 Paroxysmal atrial fibrillation: Secondary | ICD-10-CM | POA: Diagnosis not present

## 2015-10-13 DIAGNOSIS — G4733 Obstructive sleep apnea (adult) (pediatric): Secondary | ICD-10-CM | POA: Diagnosis not present

## 2015-10-13 DIAGNOSIS — E119 Type 2 diabetes mellitus without complications: Secondary | ICD-10-CM | POA: Diagnosis not present

## 2015-10-13 DIAGNOSIS — Z6835 Body mass index (BMI) 35.0-35.9, adult: Secondary | ICD-10-CM | POA: Diagnosis not present

## 2015-10-26 DIAGNOSIS — S83242A Other tear of medial meniscus, current injury, left knee, initial encounter: Secondary | ICD-10-CM | POA: Diagnosis not present

## 2015-11-04 DIAGNOSIS — M25562 Pain in left knee: Secondary | ICD-10-CM | POA: Diagnosis not present

## 2015-12-08 DIAGNOSIS — M797 Fibromyalgia: Secondary | ICD-10-CM | POA: Diagnosis not present

## 2015-12-08 DIAGNOSIS — J45909 Unspecified asthma, uncomplicated: Secondary | ICD-10-CM | POA: Diagnosis not present

## 2015-12-08 DIAGNOSIS — I4891 Unspecified atrial fibrillation: Secondary | ICD-10-CM | POA: Diagnosis not present

## 2015-12-08 DIAGNOSIS — E612 Magnesium deficiency: Secondary | ICD-10-CM | POA: Diagnosis not present

## 2015-12-08 DIAGNOSIS — E119 Type 2 diabetes mellitus without complications: Secondary | ICD-10-CM | POA: Diagnosis not present

## 2015-12-08 DIAGNOSIS — E7800 Pure hypercholesterolemia, unspecified: Secondary | ICD-10-CM | POA: Diagnosis not present

## 2015-12-08 DIAGNOSIS — K219 Gastro-esophageal reflux disease without esophagitis: Secondary | ICD-10-CM | POA: Diagnosis not present

## 2015-12-08 DIAGNOSIS — R87810 Cervical high risk human papillomavirus (HPV) DNA test positive: Secondary | ICD-10-CM | POA: Diagnosis not present

## 2015-12-08 DIAGNOSIS — Z1159 Encounter for screening for other viral diseases: Secondary | ICD-10-CM | POA: Diagnosis not present

## 2015-12-08 DIAGNOSIS — M25562 Pain in left knee: Secondary | ICD-10-CM | POA: Diagnosis not present

## 2015-12-08 DIAGNOSIS — E1140 Type 2 diabetes mellitus with diabetic neuropathy, unspecified: Secondary | ICD-10-CM | POA: Diagnosis not present

## 2015-12-08 DIAGNOSIS — E559 Vitamin D deficiency, unspecified: Secondary | ICD-10-CM | POA: Diagnosis not present

## 2016-01-06 DIAGNOSIS — N390 Urinary tract infection, site not specified: Secondary | ICD-10-CM | POA: Diagnosis not present

## 2016-04-14 DIAGNOSIS — E7800 Pure hypercholesterolemia, unspecified: Secondary | ICD-10-CM | POA: Diagnosis not present

## 2016-04-14 DIAGNOSIS — E612 Magnesium deficiency: Secondary | ICD-10-CM | POA: Diagnosis not present

## 2016-04-14 DIAGNOSIS — J45909 Unspecified asthma, uncomplicated: Secondary | ICD-10-CM | POA: Diagnosis not present

## 2016-04-14 DIAGNOSIS — I4891 Unspecified atrial fibrillation: Secondary | ICD-10-CM | POA: Diagnosis not present

## 2016-04-14 DIAGNOSIS — E119 Type 2 diabetes mellitus without complications: Secondary | ICD-10-CM | POA: Diagnosis not present

## 2016-04-14 DIAGNOSIS — J310 Chronic rhinitis: Secondary | ICD-10-CM | POA: Diagnosis not present

## 2016-04-14 DIAGNOSIS — E559 Vitamin D deficiency, unspecified: Secondary | ICD-10-CM | POA: Diagnosis not present

## 2016-04-14 DIAGNOSIS — R87810 Cervical high risk human papillomavirus (HPV) DNA test positive: Secondary | ICD-10-CM | POA: Diagnosis not present

## 2016-04-14 DIAGNOSIS — M797 Fibromyalgia: Secondary | ICD-10-CM | POA: Diagnosis not present

## 2016-04-14 DIAGNOSIS — Z23 Encounter for immunization: Secondary | ICD-10-CM | POA: Diagnosis not present

## 2016-04-14 DIAGNOSIS — E1140 Type 2 diabetes mellitus with diabetic neuropathy, unspecified: Secondary | ICD-10-CM | POA: Diagnosis not present

## 2016-04-14 DIAGNOSIS — K219 Gastro-esophageal reflux disease without esophagitis: Secondary | ICD-10-CM | POA: Diagnosis not present

## 2016-04-17 DIAGNOSIS — M25551 Pain in right hip: Secondary | ICD-10-CM | POA: Diagnosis not present

## 2016-04-17 DIAGNOSIS — M545 Low back pain: Secondary | ICD-10-CM | POA: Diagnosis not present

## 2016-05-04 DIAGNOSIS — G4733 Obstructive sleep apnea (adult) (pediatric): Secondary | ICD-10-CM | POA: Diagnosis not present

## 2016-05-04 DIAGNOSIS — Z7984 Long term (current) use of oral hypoglycemic drugs: Secondary | ICD-10-CM | POA: Diagnosis not present

## 2016-05-04 DIAGNOSIS — Z7982 Long term (current) use of aspirin: Secondary | ICD-10-CM | POA: Diagnosis not present

## 2016-05-04 DIAGNOSIS — Z880 Allergy status to penicillin: Secondary | ICD-10-CM | POA: Diagnosis not present

## 2016-05-04 DIAGNOSIS — I10 Essential (primary) hypertension: Secondary | ICD-10-CM | POA: Diagnosis not present

## 2016-05-04 DIAGNOSIS — Z79899 Other long term (current) drug therapy: Secondary | ICD-10-CM | POA: Diagnosis not present

## 2016-05-04 DIAGNOSIS — J45909 Unspecified asthma, uncomplicated: Secondary | ICD-10-CM | POA: Diagnosis not present

## 2016-05-04 DIAGNOSIS — R42 Dizziness and giddiness: Secondary | ICD-10-CM | POA: Diagnosis not present

## 2016-05-04 DIAGNOSIS — Z7951 Long term (current) use of inhaled steroids: Secondary | ICD-10-CM | POA: Diagnosis not present

## 2016-05-04 DIAGNOSIS — E119 Type 2 diabetes mellitus without complications: Secondary | ICD-10-CM | POA: Diagnosis not present

## 2016-05-04 DIAGNOSIS — H8101 Meniere's disease, right ear: Secondary | ICD-10-CM | POA: Diagnosis not present

## 2016-05-04 DIAGNOSIS — Z882 Allergy status to sulfonamides status: Secondary | ICD-10-CM | POA: Diagnosis not present

## 2016-08-02 DIAGNOSIS — H25813 Combined forms of age-related cataract, bilateral: Secondary | ICD-10-CM | POA: Diagnosis not present

## 2016-08-02 DIAGNOSIS — E119 Type 2 diabetes mellitus without complications: Secondary | ICD-10-CM | POA: Diagnosis not present

## 2016-08-02 DIAGNOSIS — H04123 Dry eye syndrome of bilateral lacrimal glands: Secondary | ICD-10-CM | POA: Diagnosis not present

## 2016-08-07 DIAGNOSIS — Z9889 Other specified postprocedural states: Secondary | ICD-10-CM | POA: Diagnosis not present

## 2016-08-07 DIAGNOSIS — H2511 Age-related nuclear cataract, right eye: Secondary | ICD-10-CM | POA: Diagnosis not present

## 2016-08-07 DIAGNOSIS — H52213 Irregular astigmatism, bilateral: Secondary | ICD-10-CM | POA: Diagnosis not present

## 2016-08-15 DIAGNOSIS — E119 Type 2 diabetes mellitus without complications: Secondary | ICD-10-CM | POA: Diagnosis not present

## 2016-08-15 DIAGNOSIS — I4891 Unspecified atrial fibrillation: Secondary | ICD-10-CM | POA: Diagnosis not present

## 2016-08-15 DIAGNOSIS — Z7984 Long term (current) use of oral hypoglycemic drugs: Secondary | ICD-10-CM | POA: Diagnosis not present

## 2016-08-15 DIAGNOSIS — E559 Vitamin D deficiency, unspecified: Secondary | ICD-10-CM | POA: Diagnosis not present

## 2016-08-15 DIAGNOSIS — E612 Magnesium deficiency: Secondary | ICD-10-CM | POA: Diagnosis not present

## 2016-08-15 DIAGNOSIS — J45909 Unspecified asthma, uncomplicated: Secondary | ICD-10-CM | POA: Diagnosis not present

## 2016-08-15 DIAGNOSIS — G2581 Restless legs syndrome: Secondary | ICD-10-CM | POA: Diagnosis not present

## 2016-08-15 DIAGNOSIS — R87810 Cervical high risk human papillomavirus (HPV) DNA test positive: Secondary | ICD-10-CM | POA: Diagnosis not present

## 2016-08-15 DIAGNOSIS — E1165 Type 2 diabetes mellitus with hyperglycemia: Secondary | ICD-10-CM | POA: Diagnosis not present

## 2016-08-15 DIAGNOSIS — E1140 Type 2 diabetes mellitus with diabetic neuropathy, unspecified: Secondary | ICD-10-CM | POA: Diagnosis not present

## 2016-08-15 DIAGNOSIS — M545 Low back pain: Secondary | ICD-10-CM | POA: Diagnosis not present

## 2016-08-15 DIAGNOSIS — E7800 Pure hypercholesterolemia, unspecified: Secondary | ICD-10-CM | POA: Diagnosis not present

## 2016-08-21 DIAGNOSIS — H2511 Age-related nuclear cataract, right eye: Secondary | ICD-10-CM | POA: Diagnosis not present

## 2016-08-21 DIAGNOSIS — H25011 Cortical age-related cataract, right eye: Secondary | ICD-10-CM | POA: Diagnosis not present

## 2016-08-21 DIAGNOSIS — H25811 Combined forms of age-related cataract, right eye: Secondary | ICD-10-CM | POA: Diagnosis not present

## 2016-09-06 DIAGNOSIS — H2512 Age-related nuclear cataract, left eye: Secondary | ICD-10-CM | POA: Diagnosis not present

## 2016-09-07 DIAGNOSIS — R6881 Early satiety: Secondary | ICD-10-CM | POA: Diagnosis not present

## 2016-09-11 DIAGNOSIS — H2511 Age-related nuclear cataract, right eye: Secondary | ICD-10-CM | POA: Diagnosis not present

## 2016-09-11 DIAGNOSIS — H2512 Age-related nuclear cataract, left eye: Secondary | ICD-10-CM | POA: Diagnosis not present

## 2016-09-14 ENCOUNTER — Ambulatory Visit (HOSPITAL_COMMUNITY)
Admission: RE | Admit: 2016-09-14 | Discharge: 2016-09-14 | Disposition: A | Discharge status: Home / Self Care | Payer: Medicare Other | Source: Ambulatory Visit | Attending: Gastroenterology | Admitting: Gastroenterology

## 2016-09-14 ENCOUNTER — Encounter (HOSPITAL_COMMUNITY)
Admission: RE | Admit: 2016-09-14 | Discharge: 2016-09-14 | Disposition: A | Discharge status: Home / Self Care | Payer: Medicare Other | Source: Ambulatory Visit | Attending: Gastroenterology | Admitting: Gastroenterology

## 2016-09-14 ENCOUNTER — Other Ambulatory Visit (HOSPITAL_COMMUNITY): Payer: Self-pay | Admitting: Gastroenterology

## 2016-09-14 ENCOUNTER — Encounter (HOSPITAL_COMMUNITY): Payer: Self-pay

## 2016-09-14 DIAGNOSIS — R12 Heartburn: Secondary | ICD-10-CM

## 2016-09-14 DIAGNOSIS — R6881 Early satiety: Secondary | ICD-10-CM

## 2016-09-14 DIAGNOSIS — R109 Unspecified abdominal pain: Secondary | ICD-10-CM | POA: Diagnosis not present

## 2016-09-14 MED ORDER — TECHNETIUM TC 99M SULFUR COLLOID
2.0000 | Freq: Once | INTRAVENOUS | Status: AC | PRN
  Administered 2016-09-14: 2 via ORAL

## 2016-09-14 MED ORDER — TECHNETIUM TC 99M SULFUR COLLOID: 2.0000 | Freq: Once | INTRAVENOUS | Status: DC | PRN

## 2016-10-05 DIAGNOSIS — Z5181 Encounter for therapeutic drug level monitoring: Secondary | ICD-10-CM | POA: Diagnosis not present

## 2016-10-05 DIAGNOSIS — G4700 Insomnia, unspecified: Secondary | ICD-10-CM | POA: Diagnosis not present

## 2016-11-17 DIAGNOSIS — E119 Type 2 diabetes mellitus without complications: Secondary | ICD-10-CM | POA: Diagnosis not present

## 2016-11-17 DIAGNOSIS — G4733 Obstructive sleep apnea (adult) (pediatric): Secondary | ICD-10-CM | POA: Diagnosis not present

## 2016-11-17 DIAGNOSIS — I480 Paroxysmal atrial fibrillation: Secondary | ICD-10-CM | POA: Diagnosis not present

## 2017-01-05 DIAGNOSIS — G4700 Insomnia, unspecified: Secondary | ICD-10-CM | POA: Diagnosis not present

## 2017-01-05 DIAGNOSIS — M797 Fibromyalgia: Secondary | ICD-10-CM | POA: Diagnosis not present

## 2017-01-05 DIAGNOSIS — I4891 Unspecified atrial fibrillation: Secondary | ICD-10-CM | POA: Diagnosis not present

## 2017-01-05 DIAGNOSIS — E612 Magnesium deficiency: Secondary | ICD-10-CM | POA: Diagnosis not present

## 2017-01-05 DIAGNOSIS — E559 Vitamin D deficiency, unspecified: Secondary | ICD-10-CM | POA: Diagnosis not present

## 2017-01-05 DIAGNOSIS — R87810 Cervical high risk human papillomavirus (HPV) DNA test positive: Secondary | ICD-10-CM | POA: Diagnosis not present

## 2017-01-05 DIAGNOSIS — E7800 Pure hypercholesterolemia, unspecified: Secondary | ICD-10-CM | POA: Diagnosis not present

## 2017-01-05 DIAGNOSIS — M545 Low back pain: Secondary | ICD-10-CM | POA: Diagnosis not present

## 2017-01-05 DIAGNOSIS — K219 Gastro-esophageal reflux disease without esophagitis: Secondary | ICD-10-CM | POA: Diagnosis not present

## 2017-01-05 DIAGNOSIS — E1140 Type 2 diabetes mellitus with diabetic neuropathy, unspecified: Secondary | ICD-10-CM | POA: Diagnosis not present

## 2017-01-05 DIAGNOSIS — J45909 Unspecified asthma, uncomplicated: Secondary | ICD-10-CM | POA: Diagnosis not present

## 2017-01-05 DIAGNOSIS — E119 Type 2 diabetes mellitus without complications: Secondary | ICD-10-CM | POA: Diagnosis not present

## 2017-02-21 DIAGNOSIS — Z961 Presence of intraocular lens: Secondary | ICD-10-CM | POA: Diagnosis not present

## 2017-02-21 DIAGNOSIS — H20012 Primary iridocyclitis, left eye: Secondary | ICD-10-CM | POA: Diagnosis not present

## 2017-04-03 DIAGNOSIS — E119 Type 2 diabetes mellitus without complications: Secondary | ICD-10-CM | POA: Diagnosis not present

## 2017-04-03 DIAGNOSIS — E559 Vitamin D deficiency, unspecified: Secondary | ICD-10-CM | POA: Diagnosis not present

## 2017-04-03 DIAGNOSIS — R87810 Cervical high risk human papillomavirus (HPV) DNA test positive: Secondary | ICD-10-CM | POA: Diagnosis not present

## 2017-04-03 DIAGNOSIS — E612 Magnesium deficiency: Secondary | ICD-10-CM | POA: Diagnosis not present

## 2017-04-03 DIAGNOSIS — E7800 Pure hypercholesterolemia, unspecified: Secondary | ICD-10-CM | POA: Diagnosis not present

## 2017-04-03 DIAGNOSIS — J45909 Unspecified asthma, uncomplicated: Secondary | ICD-10-CM | POA: Diagnosis not present

## 2017-04-03 DIAGNOSIS — H259 Unspecified age-related cataract: Secondary | ICD-10-CM | POA: Diagnosis not present

## 2017-04-03 DIAGNOSIS — E1140 Type 2 diabetes mellitus with diabetic neuropathy, unspecified: Secondary | ICD-10-CM | POA: Diagnosis not present

## 2017-04-03 DIAGNOSIS — K219 Gastro-esophageal reflux disease without esophagitis: Secondary | ICD-10-CM | POA: Diagnosis not present

## 2017-04-03 DIAGNOSIS — G4700 Insomnia, unspecified: Secondary | ICD-10-CM | POA: Diagnosis not present

## 2017-04-03 DIAGNOSIS — I4891 Unspecified atrial fibrillation: Secondary | ICD-10-CM | POA: Diagnosis not present

## 2017-04-03 DIAGNOSIS — M797 Fibromyalgia: Secondary | ICD-10-CM | POA: Diagnosis not present

## 2017-04-05 DIAGNOSIS — E1140 Type 2 diabetes mellitus with diabetic neuropathy, unspecified: Secondary | ICD-10-CM | POA: Diagnosis not present

## 2017-04-05 DIAGNOSIS — G4762 Sleep related leg cramps: Secondary | ICD-10-CM | POA: Diagnosis not present

## 2017-04-05 DIAGNOSIS — R87810 Cervical high risk human papillomavirus (HPV) DNA test positive: Secondary | ICD-10-CM | POA: Diagnosis not present

## 2017-04-05 DIAGNOSIS — K219 Gastro-esophageal reflux disease without esophagitis: Secondary | ICD-10-CM | POA: Diagnosis not present

## 2017-04-05 DIAGNOSIS — M797 Fibromyalgia: Secondary | ICD-10-CM | POA: Diagnosis not present

## 2017-04-05 DIAGNOSIS — G2581 Restless legs syndrome: Secondary | ICD-10-CM | POA: Diagnosis not present

## 2017-04-05 DIAGNOSIS — E119 Type 2 diabetes mellitus without complications: Secondary | ICD-10-CM | POA: Diagnosis not present

## 2017-04-05 DIAGNOSIS — E7800 Pure hypercholesterolemia, unspecified: Secondary | ICD-10-CM | POA: Diagnosis not present

## 2017-04-05 DIAGNOSIS — J45909 Unspecified asthma, uncomplicated: Secondary | ICD-10-CM | POA: Diagnosis not present

## 2017-04-05 DIAGNOSIS — E559 Vitamin D deficiency, unspecified: Secondary | ICD-10-CM | POA: Diagnosis not present

## 2017-04-05 DIAGNOSIS — I4891 Unspecified atrial fibrillation: Secondary | ICD-10-CM | POA: Diagnosis not present

## 2017-04-05 DIAGNOSIS — E612 Magnesium deficiency: Secondary | ICD-10-CM | POA: Diagnosis not present

## 2017-04-16 DIAGNOSIS — Z961 Presence of intraocular lens: Secondary | ICD-10-CM | POA: Diagnosis not present

## 2017-04-16 DIAGNOSIS — H26491 Other secondary cataract, right eye: Secondary | ICD-10-CM | POA: Diagnosis not present

## 2017-04-16 DIAGNOSIS — H20022 Recurrent acute iridocyclitis, left eye: Secondary | ICD-10-CM | POA: Diagnosis not present

## 2017-04-16 DIAGNOSIS — H15102 Unspecified episcleritis, left eye: Secondary | ICD-10-CM | POA: Diagnosis not present

## 2017-04-17 ENCOUNTER — Ambulatory Visit: Payer: Medicare Other | Admitting: Registered"

## 2017-04-20 DIAGNOSIS — Z1231 Encounter for screening mammogram for malignant neoplasm of breast: Secondary | ICD-10-CM | POA: Diagnosis not present

## 2017-04-20 DIAGNOSIS — M8589 Other specified disorders of bone density and structure, multiple sites: Secondary | ICD-10-CM | POA: Diagnosis not present

## 2017-04-23 ENCOUNTER — Encounter: Payer: Medicare Other | Attending: Family Medicine | Admitting: Registered"

## 2017-04-23 ENCOUNTER — Encounter: Payer: Self-pay | Admitting: Registered"

## 2017-04-23 DIAGNOSIS — Z713 Dietary counseling and surveillance: Secondary | ICD-10-CM | POA: Diagnosis not present

## 2017-04-23 DIAGNOSIS — E785 Hyperlipidemia, unspecified: Secondary | ICD-10-CM | POA: Diagnosis not present

## 2017-04-23 DIAGNOSIS — E118 Type 2 diabetes mellitus with unspecified complications: Secondary | ICD-10-CM

## 2017-04-23 DIAGNOSIS — K219 Gastro-esophageal reflux disease without esophagitis: Secondary | ICD-10-CM | POA: Diagnosis not present

## 2017-04-23 DIAGNOSIS — E119 Type 2 diabetes mellitus without complications: Secondary | ICD-10-CM | POA: Insufficient documentation

## 2017-04-23 NOTE — Progress Notes (Signed)
Diabetes Self-Management Education  Visit Type: First/Initial  Appt. Start Time: 1100 Appt. End Time: 1230  04/23/2017  [name], identified by name and date of birth, is a 66 y.o. female with a diagnosis of Diabetes: Type 2.   ASSESSMENT Pt states she has been able to keep her A1c around 6.4% for many years and was not too concerned about the details of eating to control blood sugar. Pt reports she has been following the eating program The Next 56 Days.  Pt states she has had severe GERD and has not been able to cut back on medication. Due to long term use of PPI, RD recommended vitamin B12 supplement. Pt states she tries to not have dinner after 8 pm but will sometimes have a bedtime snack later.   Pt states she has terrible sleep quality, has tried the CPAP and has had uvula surgery. Pt goes to bed at 9, but watches TV until 11-12:30p  Per patient and chart, she has a vitamin D deficiency, but instead of supplementing she is planning to get a rescue dog and plans to walk outside 3x day.  Diabetes Self-Management Education - 04/23/17 1110      Visit Information   Visit Type  First/Initial      Initial Visit   Diabetes Type  Type 2    Are you currently following a meal plan?  No    Are you taking your medications as prescribed?  Yes    Date Diagnosed  several years ago, also had GDM      Health Coping   How would you rate your overall health?  Fair      Psychosocial Assessment   Patient Belief/Attitude about Diabetes  Motivated to manage diabetes      Complications   Last HgB A1C per patient/outside source  7.4 % per patient   per patient   How often do you check your blood sugar?  0 times/day (not testing)    Have you had a dilated eye exam in the past 12 months?  Yes    Have you had a dental exam in the past 12 months?  Yes    Are you checking your feet?  Yes    How many days per week are you checking your feet?  7      Dietary Intake   Breakfast  egg  mcmuffin, water    Snack (morning)  none    Lunch  [employer], chicken rice, broccoli, veggies    Snack (afternoon)  pumpkin pie without crust OR string cheese    Dinner  spinach & cheese ravelio OR pasta w tomato sauce, parmesean OR grilled chicken crapresi    Snack (evening)  honey crisp apple    Beverage(s)  water, bai drinks, rarely soda & sweet tea       Exercise   Exercise Type  ADL's    How many days per week to you exercise?  0    How many minutes per day do you exercise?  0    Total minutes per week of exercise  0      Patient Education   Previous Diabetes Education  Yes (please comment) a long time ago   a long time ago   Disease state   Definition of diabetes, type 1 and 2, and the diagnosis of diabetes    Nutrition management   Role of diet in the treatment of diabetes and the relationship between the three main macronutrients  and blood glucose level;Carbohydrate counting;Food label reading, portion sizes and measuring food.    Monitoring  Identified appropriate SMBG and/or A1C goals.    Acute complications  Taught treatment of hypoglycemia - the 15 rule.    Chronic complications  Relationship between chronic complications and blood glucose control    Psychosocial adjustment  Role of stress on diabetes      Individualized Goals (developed by patient)   Nutrition  General guidelines for healthy choices and portions discussed    Monitoring   test my blood glucose as discussed      Outcomes   Expected Outcomes  Demonstrated interest in learning. Expect positive outcomes    Future DMSE  4-6 wks    Program Status  Not Completed     Individualized Plan for Diabetes Self-Management Training:   Learning Objective:  Patient will have a greater understanding of diabetes self-management. Patient education plan is to attend individual and/or group sessions per assessed needs and concerns.  Patient Instructions  Melatonin comes in extended release, check your  bottle http://diabetes.ada-ksw.com/publication/?i=472991#{"issue_id":472991,"page":0 Consider taking over the counter Vitamin B12  Plan:  Aim for 3-4 Carb Choices per meal (45-60 grams)   Aim for 0-1 Carbs per snack if hungry  Include protein with your meals and snacks Consider reading food labels for Total Carbohydrate Great plan to walk 3 times a day and get plenty of sunlight! Taking your blood sugar is a good way to see if your changes are bringing down your blood sugar. Continue taking medication as directed by MD  Expected Outcomes:  Demonstrated interest in learning. Expect positive outcomes  Education material provided: Living Well with Diabetes, Food label handouts, A1C conversion sheet, My Plate, Snack sheet and Carbohydrate counting sheet, sleep hygiene tips  If problems or questions, patient to contact team via:  Phone  Future DSME appointment: 4-6 wks

## 2017-04-23 NOTE — Patient Instructions (Addendum)
Melatonin comes in extended release, check your bottle http://diabetes.ada-ksw.com/publication/?i=472991#{"issue_id":472991,"page":0 Consider taking over the counter Vitamin B12  Plan:  Aim for 3-4 Carb Choices per meal (45-60 grams)   Aim for 0-1 Carbs per snack if hungry  Include protein with your meals and snacks Consider reading food labels for Total Carbohydrate Great plan to walk 3 times a day and get plenty of sunlight! Taking your blood sugar is a good way to see if your changes are bringing down your blood sugar. Continue taking medication as directed by MD

## 2017-04-26 DIAGNOSIS — Z79899 Other long term (current) drug therapy: Secondary | ICD-10-CM | POA: Diagnosis not present

## 2017-04-26 DIAGNOSIS — R002 Palpitations: Secondary | ICD-10-CM | POA: Diagnosis not present

## 2017-04-26 DIAGNOSIS — I10 Essential (primary) hypertension: Secondary | ICD-10-CM | POA: Diagnosis not present

## 2017-04-26 DIAGNOSIS — E118 Type 2 diabetes mellitus with unspecified complications: Secondary | ICD-10-CM | POA: Diagnosis not present

## 2017-04-26 DIAGNOSIS — G4733 Obstructive sleep apnea (adult) (pediatric): Secondary | ICD-10-CM | POA: Diagnosis not present

## 2017-04-26 DIAGNOSIS — I482 Chronic atrial fibrillation: Secondary | ICD-10-CM | POA: Diagnosis not present

## 2017-04-26 DIAGNOSIS — I480 Paroxysmal atrial fibrillation: Secondary | ICD-10-CM | POA: Diagnosis not present

## 2017-04-30 DIAGNOSIS — R002 Palpitations: Secondary | ICD-10-CM | POA: Diagnosis not present

## 2017-05-04 DIAGNOSIS — G4733 Obstructive sleep apnea (adult) (pediatric): Secondary | ICD-10-CM | POA: Diagnosis not present

## 2017-05-04 DIAGNOSIS — K219 Gastro-esophageal reflux disease without esophagitis: Secondary | ICD-10-CM | POA: Diagnosis not present

## 2017-05-04 DIAGNOSIS — I482 Chronic atrial fibrillation: Secondary | ICD-10-CM | POA: Diagnosis not present

## 2017-05-04 DIAGNOSIS — R42 Dizziness and giddiness: Secondary | ICD-10-CM | POA: Diagnosis not present

## 2017-05-04 DIAGNOSIS — H8101 Meniere's disease, right ear: Secondary | ICD-10-CM | POA: Diagnosis not present

## 2017-05-04 DIAGNOSIS — Z7901 Long term (current) use of anticoagulants: Secondary | ICD-10-CM | POA: Diagnosis not present

## 2017-05-07 DIAGNOSIS — G4733 Obstructive sleep apnea (adult) (pediatric): Secondary | ICD-10-CM | POA: Diagnosis not present

## 2017-05-16 DIAGNOSIS — Z961 Presence of intraocular lens: Secondary | ICD-10-CM | POA: Diagnosis not present

## 2017-05-16 DIAGNOSIS — H26491 Other secondary cataract, right eye: Secondary | ICD-10-CM | POA: Diagnosis not present

## 2017-05-16 DIAGNOSIS — H20022 Recurrent acute iridocyclitis, left eye: Secondary | ICD-10-CM | POA: Diagnosis not present

## 2017-05-16 DIAGNOSIS — H15102 Unspecified episcleritis, left eye: Secondary | ICD-10-CM | POA: Diagnosis not present

## 2017-06-04 ENCOUNTER — Ambulatory Visit: Payer: Medicare Other | Admitting: Registered"

## 2017-06-29 DIAGNOSIS — H10413 Chronic giant papillary conjunctivitis, bilateral: Secondary | ICD-10-CM | POA: Diagnosis not present

## 2017-06-29 DIAGNOSIS — H20022 Recurrent acute iridocyclitis, left eye: Secondary | ICD-10-CM | POA: Diagnosis not present

## 2017-06-29 DIAGNOSIS — H04123 Dry eye syndrome of bilateral lacrimal glands: Secondary | ICD-10-CM | POA: Diagnosis not present

## 2017-07-04 DIAGNOSIS — I480 Paroxysmal atrial fibrillation: Secondary | ICD-10-CM | POA: Diagnosis not present

## 2017-07-04 DIAGNOSIS — E119 Type 2 diabetes mellitus without complications: Secondary | ICD-10-CM | POA: Diagnosis not present

## 2017-07-04 DIAGNOSIS — Z7901 Long term (current) use of anticoagulants: Secondary | ICD-10-CM | POA: Diagnosis not present

## 2017-07-04 DIAGNOSIS — I10 Essential (primary) hypertension: Secondary | ICD-10-CM | POA: Diagnosis not present

## 2017-07-04 DIAGNOSIS — J45909 Unspecified asthma, uncomplicated: Secondary | ICD-10-CM | POA: Diagnosis not present

## 2017-07-12 DIAGNOSIS — R87810 Cervical high risk human papillomavirus (HPV) DNA test positive: Secondary | ICD-10-CM | POA: Diagnosis not present

## 2017-07-12 DIAGNOSIS — E1165 Type 2 diabetes mellitus with hyperglycemia: Secondary | ICD-10-CM | POA: Diagnosis not present

## 2017-07-12 DIAGNOSIS — E1140 Type 2 diabetes mellitus with diabetic neuropathy, unspecified: Secondary | ICD-10-CM | POA: Diagnosis not present

## 2017-07-12 DIAGNOSIS — E7800 Pure hypercholesterolemia, unspecified: Secondary | ICD-10-CM | POA: Diagnosis not present

## 2017-07-12 DIAGNOSIS — G8929 Other chronic pain: Secondary | ICD-10-CM | POA: Diagnosis not present

## 2017-07-12 DIAGNOSIS — I4891 Unspecified atrial fibrillation: Secondary | ICD-10-CM | POA: Diagnosis not present

## 2017-07-12 DIAGNOSIS — E119 Type 2 diabetes mellitus without complications: Secondary | ICD-10-CM | POA: Diagnosis not present

## 2017-07-12 DIAGNOSIS — Z7984 Long term (current) use of oral hypoglycemic drugs: Secondary | ICD-10-CM | POA: Diagnosis not present

## 2017-07-12 DIAGNOSIS — J45909 Unspecified asthma, uncomplicated: Secondary | ICD-10-CM | POA: Diagnosis not present

## 2017-07-12 DIAGNOSIS — E559 Vitamin D deficiency, unspecified: Secondary | ICD-10-CM | POA: Diagnosis not present

## 2017-07-12 DIAGNOSIS — G2581 Restless legs syndrome: Secondary | ICD-10-CM | POA: Diagnosis not present

## 2017-07-12 DIAGNOSIS — E612 Magnesium deficiency: Secondary | ICD-10-CM | POA: Diagnosis not present

## 2017-07-19 DIAGNOSIS — R509 Fever, unspecified: Secondary | ICD-10-CM | POA: Diagnosis not present

## 2017-07-19 DIAGNOSIS — J069 Acute upper respiratory infection, unspecified: Secondary | ICD-10-CM | POA: Diagnosis not present

## 2017-07-27 DIAGNOSIS — J019 Acute sinusitis, unspecified: Secondary | ICD-10-CM | POA: Diagnosis not present

## 2017-08-10 DIAGNOSIS — R42 Dizziness and giddiness: Secondary | ICD-10-CM | POA: Diagnosis not present

## 2017-08-29 DIAGNOSIS — G4733 Obstructive sleep apnea (adult) (pediatric): Secondary | ICD-10-CM | POA: Diagnosis not present

## 2017-08-30 DIAGNOSIS — G4733 Obstructive sleep apnea (adult) (pediatric): Secondary | ICD-10-CM | POA: Diagnosis not present

## 2017-09-26 DIAGNOSIS — Z961 Presence of intraocular lens: Secondary | ICD-10-CM | POA: Diagnosis not present

## 2017-09-26 DIAGNOSIS — H20022 Recurrent acute iridocyclitis, left eye: Secondary | ICD-10-CM | POA: Diagnosis not present

## 2017-09-26 DIAGNOSIS — E119 Type 2 diabetes mellitus without complications: Secondary | ICD-10-CM | POA: Diagnosis not present

## 2017-09-26 DIAGNOSIS — H353131 Nonexudative age-related macular degeneration, bilateral, early dry stage: Secondary | ICD-10-CM | POA: Diagnosis not present

## 2017-09-26 DIAGNOSIS — H40013 Open angle with borderline findings, low risk, bilateral: Secondary | ICD-10-CM | POA: Diagnosis not present

## 2017-10-22 DIAGNOSIS — G4733 Obstructive sleep apnea (adult) (pediatric): Secondary | ICD-10-CM | POA: Diagnosis not present

## 2017-10-22 DIAGNOSIS — K219 Gastro-esophageal reflux disease without esophagitis: Secondary | ICD-10-CM | POA: Diagnosis not present

## 2017-10-22 DIAGNOSIS — E7800 Pure hypercholesterolemia, unspecified: Secondary | ICD-10-CM | POA: Diagnosis not present

## 2017-10-22 DIAGNOSIS — I4891 Unspecified atrial fibrillation: Secondary | ICD-10-CM | POA: Diagnosis not present

## 2017-10-22 DIAGNOSIS — S90222A Contusion of left lesser toe(s) with damage to nail, initial encounter: Secondary | ICD-10-CM | POA: Diagnosis not present

## 2017-10-22 DIAGNOSIS — G8929 Other chronic pain: Secondary | ICD-10-CM | POA: Diagnosis not present

## 2017-10-22 DIAGNOSIS — E1140 Type 2 diabetes mellitus with diabetic neuropathy, unspecified: Secondary | ICD-10-CM | POA: Diagnosis not present

## 2017-10-22 DIAGNOSIS — Z780 Asymptomatic menopausal state: Secondary | ICD-10-CM | POA: Diagnosis not present

## 2017-10-22 DIAGNOSIS — M797 Fibromyalgia: Secondary | ICD-10-CM | POA: Diagnosis not present

## 2017-10-22 DIAGNOSIS — E1149 Type 2 diabetes mellitus with other diabetic neurological complication: Secondary | ICD-10-CM | POA: Diagnosis not present

## 2017-11-26 DIAGNOSIS — E1140 Type 2 diabetes mellitus with diabetic neuropathy, unspecified: Secondary | ICD-10-CM | POA: Diagnosis not present

## 2017-11-26 DIAGNOSIS — K219 Gastro-esophageal reflux disease without esophagitis: Secondary | ICD-10-CM | POA: Diagnosis not present

## 2017-11-26 DIAGNOSIS — E7800 Pure hypercholesterolemia, unspecified: Secondary | ICD-10-CM | POA: Diagnosis not present

## 2017-11-26 DIAGNOSIS — G8929 Other chronic pain: Secondary | ICD-10-CM | POA: Diagnosis not present

## 2017-11-26 DIAGNOSIS — Z780 Asymptomatic menopausal state: Secondary | ICD-10-CM | POA: Diagnosis not present

## 2017-11-26 DIAGNOSIS — I4891 Unspecified atrial fibrillation: Secondary | ICD-10-CM | POA: Diagnosis not present

## 2017-11-26 DIAGNOSIS — M797 Fibromyalgia: Secondary | ICD-10-CM | POA: Diagnosis not present

## 2017-11-26 DIAGNOSIS — E1149 Type 2 diabetes mellitus with other diabetic neurological complication: Secondary | ICD-10-CM | POA: Diagnosis not present

## 2017-11-26 DIAGNOSIS — G4733 Obstructive sleep apnea (adult) (pediatric): Secondary | ICD-10-CM | POA: Diagnosis not present

## 2017-12-05 DIAGNOSIS — G4733 Obstructive sleep apnea (adult) (pediatric): Secondary | ICD-10-CM | POA: Diagnosis not present

## 2017-12-31 ENCOUNTER — Ambulatory Visit (INDEPENDENT_AMBULATORY_CARE_PROVIDER_SITE_OTHER): Payer: Medicare Other | Admitting: Licensed Clinical Social Worker

## 2017-12-31 DIAGNOSIS — F3341 Major depressive disorder, recurrent, in partial remission: Secondary | ICD-10-CM | POA: Diagnosis not present

## 2018-01-09 ENCOUNTER — Ambulatory Visit (INDEPENDENT_AMBULATORY_CARE_PROVIDER_SITE_OTHER): Payer: Medicare Other | Admitting: Licensed Clinical Social Worker

## 2018-01-09 DIAGNOSIS — F3341 Major depressive disorder, recurrent, in partial remission: Secondary | ICD-10-CM

## 2018-01-17 DIAGNOSIS — E1140 Type 2 diabetes mellitus with diabetic neuropathy, unspecified: Secondary | ICD-10-CM | POA: Diagnosis not present

## 2018-01-17 DIAGNOSIS — M797 Fibromyalgia: Secondary | ICD-10-CM | POA: Diagnosis not present

## 2018-01-17 DIAGNOSIS — E7800 Pure hypercholesterolemia, unspecified: Secondary | ICD-10-CM | POA: Diagnosis not present

## 2018-01-17 DIAGNOSIS — I4891 Unspecified atrial fibrillation: Secondary | ICD-10-CM | POA: Diagnosis not present

## 2018-01-17 DIAGNOSIS — G8929 Other chronic pain: Secondary | ICD-10-CM | POA: Diagnosis not present

## 2018-01-17 DIAGNOSIS — E1149 Type 2 diabetes mellitus with other diabetic neurological complication: Secondary | ICD-10-CM | POA: Diagnosis not present

## 2018-01-17 DIAGNOSIS — E1165 Type 2 diabetes mellitus with hyperglycemia: Secondary | ICD-10-CM | POA: Diagnosis not present

## 2018-01-17 DIAGNOSIS — Z780 Asymptomatic menopausal state: Secondary | ICD-10-CM | POA: Diagnosis not present

## 2018-01-17 DIAGNOSIS — K219 Gastro-esophageal reflux disease without esophagitis: Secondary | ICD-10-CM | POA: Diagnosis not present

## 2018-01-23 ENCOUNTER — Ambulatory Visit (INDEPENDENT_AMBULATORY_CARE_PROVIDER_SITE_OTHER): Payer: Medicare Other | Admitting: Licensed Clinical Social Worker

## 2018-01-23 DIAGNOSIS — F3341 Major depressive disorder, recurrent, in partial remission: Secondary | ICD-10-CM

## 2018-01-25 DIAGNOSIS — B351 Tinea unguium: Secondary | ICD-10-CM | POA: Diagnosis not present

## 2018-01-25 DIAGNOSIS — M79672 Pain in left foot: Secondary | ICD-10-CM | POA: Diagnosis not present

## 2018-01-25 DIAGNOSIS — M79671 Pain in right foot: Secondary | ICD-10-CM | POA: Diagnosis not present

## 2018-01-25 DIAGNOSIS — L89892 Pressure ulcer of other site, stage 2: Secondary | ICD-10-CM | POA: Diagnosis not present

## 2018-01-25 DIAGNOSIS — E11621 Type 2 diabetes mellitus with foot ulcer: Secondary | ICD-10-CM | POA: Diagnosis not present

## 2018-01-25 DIAGNOSIS — E119 Type 2 diabetes mellitus without complications: Secondary | ICD-10-CM | POA: Diagnosis not present

## 2018-02-07 ENCOUNTER — Ambulatory Visit (INDEPENDENT_AMBULATORY_CARE_PROVIDER_SITE_OTHER): Payer: Medicare Other | Admitting: Licensed Clinical Social Worker

## 2018-02-07 DIAGNOSIS — F3341 Major depressive disorder, recurrent, in partial remission: Secondary | ICD-10-CM | POA: Diagnosis not present

## 2018-02-20 ENCOUNTER — Ambulatory Visit (INDEPENDENT_AMBULATORY_CARE_PROVIDER_SITE_OTHER): Payer: Medicare Other | Admitting: Licensed Clinical Social Worker

## 2018-02-20 DIAGNOSIS — F3341 Major depressive disorder, recurrent, in partial remission: Secondary | ICD-10-CM

## 2018-03-04 ENCOUNTER — Ambulatory Visit (INDEPENDENT_AMBULATORY_CARE_PROVIDER_SITE_OTHER): Payer: Medicare Other | Admitting: Licensed Clinical Social Worker

## 2018-03-04 DIAGNOSIS — F3341 Major depressive disorder, recurrent, in partial remission: Secondary | ICD-10-CM

## 2018-03-18 ENCOUNTER — Ambulatory Visit (INDEPENDENT_AMBULATORY_CARE_PROVIDER_SITE_OTHER): Payer: Medicare Other | Admitting: Licensed Clinical Social Worker

## 2018-03-18 DIAGNOSIS — F3341 Major depressive disorder, recurrent, in partial remission: Secondary | ICD-10-CM

## 2018-04-03 ENCOUNTER — Ambulatory Visit (INDEPENDENT_AMBULATORY_CARE_PROVIDER_SITE_OTHER): Payer: Medicare Other | Admitting: Licensed Clinical Social Worker

## 2018-04-03 DIAGNOSIS — F3341 Major depressive disorder, recurrent, in partial remission: Secondary | ICD-10-CM | POA: Diagnosis not present

## 2018-04-17 ENCOUNTER — Ambulatory Visit (INDEPENDENT_AMBULATORY_CARE_PROVIDER_SITE_OTHER): Payer: Medicare Other | Admitting: Licensed Clinical Social Worker

## 2018-04-17 DIAGNOSIS — F3341 Major depressive disorder, recurrent, in partial remission: Secondary | ICD-10-CM

## 2018-05-01 ENCOUNTER — Ambulatory Visit (INDEPENDENT_AMBULATORY_CARE_PROVIDER_SITE_OTHER): Payer: Medicare Other | Admitting: Licensed Clinical Social Worker

## 2018-05-01 DIAGNOSIS — E119 Type 2 diabetes mellitus without complications: Secondary | ICD-10-CM | POA: Diagnosis not present

## 2018-05-01 DIAGNOSIS — E11621 Type 2 diabetes mellitus with foot ulcer: Secondary | ICD-10-CM | POA: Diagnosis not present

## 2018-05-01 DIAGNOSIS — F3341 Major depressive disorder, recurrent, in partial remission: Secondary | ICD-10-CM

## 2018-05-01 DIAGNOSIS — B351 Tinea unguium: Secondary | ICD-10-CM | POA: Diagnosis not present

## 2018-05-01 DIAGNOSIS — L89892 Pressure ulcer of other site, stage 2: Secondary | ICD-10-CM | POA: Diagnosis not present

## 2018-05-01 DIAGNOSIS — M79671 Pain in right foot: Secondary | ICD-10-CM | POA: Diagnosis not present

## 2018-05-01 DIAGNOSIS — M79672 Pain in left foot: Secondary | ICD-10-CM | POA: Diagnosis not present

## 2018-05-07 DIAGNOSIS — H8101 Meniere's disease, right ear: Secondary | ICD-10-CM | POA: Diagnosis not present

## 2018-05-07 DIAGNOSIS — G4733 Obstructive sleep apnea (adult) (pediatric): Secondary | ICD-10-CM | POA: Diagnosis not present

## 2018-05-15 ENCOUNTER — Ambulatory Visit: Payer: Federal, State, Local not specified - PPO | Admitting: Licensed Clinical Social Worker

## 2018-05-20 DIAGNOSIS — J019 Acute sinusitis, unspecified: Secondary | ICD-10-CM | POA: Diagnosis not present

## 2018-05-23 DIAGNOSIS — K219 Gastro-esophageal reflux disease without esophagitis: Secondary | ICD-10-CM | POA: Diagnosis not present

## 2018-05-23 DIAGNOSIS — G8929 Other chronic pain: Secondary | ICD-10-CM | POA: Diagnosis not present

## 2018-05-23 DIAGNOSIS — F4323 Adjustment disorder with mixed anxiety and depressed mood: Secondary | ICD-10-CM | POA: Diagnosis not present

## 2018-05-23 DIAGNOSIS — M797 Fibromyalgia: Secondary | ICD-10-CM | POA: Diagnosis not present

## 2018-05-23 DIAGNOSIS — E1140 Type 2 diabetes mellitus with diabetic neuropathy, unspecified: Secondary | ICD-10-CM | POA: Diagnosis not present

## 2018-05-23 DIAGNOSIS — E7800 Pure hypercholesterolemia, unspecified: Secondary | ICD-10-CM | POA: Diagnosis not present

## 2018-05-23 DIAGNOSIS — E1149 Type 2 diabetes mellitus with other diabetic neurological complication: Secondary | ICD-10-CM | POA: Diagnosis not present

## 2018-05-23 DIAGNOSIS — Z780 Asymptomatic menopausal state: Secondary | ICD-10-CM | POA: Diagnosis not present

## 2018-05-23 DIAGNOSIS — J069 Acute upper respiratory infection, unspecified: Secondary | ICD-10-CM | POA: Diagnosis not present

## 2018-05-23 DIAGNOSIS — I4891 Unspecified atrial fibrillation: Secondary | ICD-10-CM | POA: Diagnosis not present

## 2018-05-28 ENCOUNTER — Ambulatory Visit (INDEPENDENT_AMBULATORY_CARE_PROVIDER_SITE_OTHER): Payer: Medicare Other | Admitting: Licensed Clinical Social Worker

## 2018-05-28 DIAGNOSIS — F3341 Major depressive disorder, recurrent, in partial remission: Secondary | ICD-10-CM

## 2018-06-06 ENCOUNTER — Ambulatory Visit (INDEPENDENT_AMBULATORY_CARE_PROVIDER_SITE_OTHER): Payer: Medicare Other | Admitting: Licensed Clinical Social Worker

## 2018-06-06 DIAGNOSIS — F3341 Major depressive disorder, recurrent, in partial remission: Secondary | ICD-10-CM

## 2018-06-14 ENCOUNTER — Encounter: Payer: Self-pay | Admitting: Neurology

## 2018-06-14 ENCOUNTER — Ambulatory Visit (INDEPENDENT_AMBULATORY_CARE_PROVIDER_SITE_OTHER): Payer: Medicare Other | Admitting: Neurology

## 2018-06-14 VITALS — BP 138/75 | HR 85 | Ht 65.0 in | Wt 217.0 lb

## 2018-06-14 DIAGNOSIS — G8929 Other chronic pain: Secondary | ICD-10-CM | POA: Diagnosis not present

## 2018-06-14 DIAGNOSIS — G4700 Insomnia, unspecified: Secondary | ICD-10-CM | POA: Diagnosis not present

## 2018-06-14 DIAGNOSIS — I4891 Unspecified atrial fibrillation: Secondary | ICD-10-CM | POA: Diagnosis not present

## 2018-06-14 DIAGNOSIS — M797 Fibromyalgia: Secondary | ICD-10-CM

## 2018-06-14 DIAGNOSIS — H8109 Meniere's disease, unspecified ear: Secondary | ICD-10-CM | POA: Diagnosis not present

## 2018-06-14 DIAGNOSIS — M5416 Radiculopathy, lumbar region: Secondary | ICD-10-CM | POA: Diagnosis not present

## 2018-06-14 DIAGNOSIS — M545 Low back pain, unspecified: Secondary | ICD-10-CM

## 2018-06-14 DIAGNOSIS — G4733 Obstructive sleep apnea (adult) (pediatric): Secondary | ICD-10-CM | POA: Diagnosis not present

## 2018-06-14 MED ORDER — CYCLOBENZAPRINE HCL 10 MG PO TABS: 10.0000 mg | ORAL_TABLET | Freq: Every day | ORAL | 3 refills | Status: DC

## 2018-06-14 NOTE — Progress Notes (Signed)
[facility]  PATIENT: Suzanne Greer DOB: [birth year]  REFERRING DOCTOR OR PCP:  Dr. Jacelyn Grip SOURCE: Patient, notes from Dr. Jacelyn Grip.  _________________________________   HISTORICAL  CHIEF COMPLAINT:  Chief Complaint  Patient presents with  . Pain    Room 13. Previously seen at [facility]. Reports chronic pain in her low back and bilateral hips.  The pain worsens with prolonged standing. Her symptoms are causing her difficulty walking.  She uses Tramadol 50mg , 1-3 capsules daily for fibromyalgia.  Occasionally, she will also use ibuprofen.    HISTORY OF PRESENT ILLNESS:  I had the pleasure of seeing your patient, Suzanne Greer, at [facility] for neurologic consultation regarding her back pain, fibromyalgia and sleep issues.  She is a 67 year old woman who has right lower back pain radiating to the right hip.    Pain increases with standing or walking a while and her gait is worse when the pain intensifies.   Sitting sown and using a heating pad helps.    However, if she sits a long time she has trouble getting up out of the chair.   Pain also increases with lifting.  Pain is worse walking uphill.     There is no pain radiating to the leg.   No weakness or numbness.   She has a long history of stress incontinence since her daughter was born.  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  No UTI x years.    I saw her at [place] 9-10 years ago initially for OSA and then for LBP.   Flexeril and NSAIDs only helped her a little bit so an ESI was performed and she felt much better for a long time afterwards.   Last lumbar MRI was 2003.    She also has neck pain when she looks up but this is not too troublesome.   She had her last cervical MRI in 2001.   She also has fibromyalgia with myalgias and joint pain.  I had prescribed gabapentin in the past.    It may have helped a bit but she is concerned about side effects.     She has difficulty with sleep many nights.   She sometimes feels flu-like.      We had done a PSG in the pastShe needed higher pressures but could not tolerate BiPAP so she had a UPPP performed (by Dr. Lu Duffel [facility]).   She then was placed on CPAP at lower pressures since a HST showed an AHI = 14 (by her report).  She also has Meniere's disease.   If she gets a bad attack, she takes diazepam (just once a year on average).   She also has AFib and is on Eliquis and Rhythmol.    She has reasonably well controlled Type 2 NIDDM with HgbA1c usually in mid 7's.     REVIEW OF SYSTEMS: Constitutional: No fevers, chills, sweats, or change in appetite.  She has insomnia.  She has fatigue. Eyes: No visual changes, double vision, eye pain Ear, nose and throat: No hearing loss, ear pain, nasal congestion, sore throat Cardiovascular: No chest pain, palpitations Respiratory: No shortness of breath at rest or with exertion.   No wheezes GastrointestinaI: No nausea, vomiting, diarrhea, abdominal pain, fecal incontinence Genitourinary: No dysuria, urinary retention or frequency.  No nocturia. Musculoskeletal:See above. Integumentary: No rash, pruritus, skin lesions Neurological: as above Psychiatric: No depression at this time.  No anxiety  Endocrine: No palpitations, diaphoresis, change in appetite, change in weigh or increased thirst Hematologic/Lymphatic: No anemia, purpura, petechiae. Allergic/Immunologic: No itchy/runny eyes, nasal congestion, recent allergic reactions, rashes  ALLERGIES: Allergies  Allergen Reactions  . Molds & Smuts   . Penicillins Itching    Has patient had a PCN reaction causing immediate rash, facial/tongue/throat swelling, SOB or lightheadedness with hypotension: No Has patient had a PCN reaction causing severe rash involving mucus membranes or skin necrosis: No Has patient had a PCN reaction that required hospitalization No Has patient had a PCN reaction occurring within  the last 10 years: No If all of the above answers are "NO", then may proceed with Cephalosporin use.  . Sulfa Drugs Cross Reactors Itching  . Yeast-Related Products     HOME MEDICATIONS:  Current Outpatient Medications:  .  apixaban (ELIQUIS) 5 MG TABS tablet, Take 5 mg by mouth 2 (two) times daily., Disp: , Rfl:  .  atorvastatin (LIPITOR) 20 MG tablet, Take 20 mg by mouth daily., Disp: , Rfl:  .  CALCIUM-MAGNESIUM-VITAMIN D PO, Take 1 tablet by mouth daily., Disp: , Rfl:  .  cyclobenzaprine (FLEXERIL) 10 MG tablet, Take 1 tablet (10 mg total) by mouth at bedtime., Disp: 30 tablet, Rfl: 3 .  fluticasone (FLONASE) 50 MCG/ACT nasal spray, Place 2 sprays into both nostrils daily., Disp: , Rfl:  .  Magnesium 400 MG TABS, Take 400 mg by mouth 2 (two) times daily., Disp: , Rfl:  .  Melatonin 10 MG TABS, Take 10 mg by mouth at bedtime as needed., Disp: , Rfl:  .  metFORMIN (GLUCOPHAGE-XR) 500 MG 24 hr tablet, Take 500 mg by mouth 2 (two) times daily. , Disp: , Rfl:  .  metoprolol (TOPROL-XL) 50 MG 24 hr tablet, Take 50 mg by mouth at bedtime. , Disp: , Rfl:  .  montelukast (SINGULAIR) 10 MG tablet, Take 10 mg by mouth at bedtime., Disp: , Rfl:  .  propafenone (RYTHMOL) 225 MG tablet, Take 225 mg by mouth 3 (three) times daily., Disp: , Rfl:  .  traMADol (ULTRAM) 50 MG tablet, Take 50 mg by mouth 3 (three) times daily as needed (Pain). , Disp: , Rfl:  .  VITAMIN D PO, Take 1 tablet by mouth daily., Disp: , Rfl:   PAST MEDICAL HISTORY: Past Medical History:  Diagnosis Date  . Atrial fibrillation ([place])   . Back pain   . Diabetes mellitus   . Dysrhythmia    history Atrial Flutter. -Dr, Chiu-cardiology [place] follows  . Fibromyalgia   . GERD (gastroesophageal reflux disease)   . Hip pain, bilateral   . Hx of seasonal allergies   . Hypertension   . Insomnia   . 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 Obesity   . Sleep apnea    no cpap use  now-unable to tolerate mask, surgery a yr ago  . Vertigo     PAST SURGICAL HISTORY: Past Surgical History:  Procedure Laterality Date  . APPENDECTOMY    . BUNIONECTOMY    . ESOPHAGOGASTRODUODENOSCOPY (EGD) WITH PROPOFOL N/A 08/16/2015   Procedure: ESOPHAGOGASTRODUODENOSCOPY (EGD) WITH PROPOFOL;  Surgeon: Garlan Fair, MD;  Location: [facility];  Service: Endoscopy;  Laterality: N/A;  . GANGLION CYST   1980   REMOVED   . HEMORROIDECTOMY    . THYROIDECTOMY, PARTIAL  2005   "partial for growth-benign"  . UPPER ENDOSCOPIC ULTRASOUND W/ FNA    . UVULECTOMY  excised about a yr ago -[place]: Family History  Problem Relation Age of Onset  . Cancer Mother 54       PERITONEAL CANCER  . Diabetes Father        unsure of complete medical hx - not seen since her 5s    SOCIAL HISTORY:  Social History   Socioeconomic History  . Marital status: Legally Separated    Spouse name: Not on file  . Number of children: 1  . Years of education: college  . Highest education level: Bachelor's degree (e.g., BA, AB, BS)  Occupational History  . Occupation: Disability  Social Needs  . Financial resource strain: Not on file  . Food insecurity:    Worry: Not on file    Inability: Not on file  . Transportation needs:    Medical: Not on file    Non-medical: Not on file  Tobacco Use  . Smoking status: Never Smoker  . Smokeless tobacco: Never Used  Substance and Sexual Activity  . Alcohol use: No  . Drug use: No  . Sexual activity: Not on file  Lifestyle  . Physical activity:    Days per week: Not on file    Minutes per session: Not on file  . Stress: Not on file  Relationships  . Social connections:    Talks on phone: Not on file    Gets together: Not on file    Attends religious service: Not on file    Active member of club or organization: Not on file    Attends meetings of clubs or organizations: Not on file    Relationship status: Not on file    . Intimate partner violence:    Fear of current or ex partner: Not on file    Emotionally abused: Not on file    Physically abused: Not on file    Forced sexual activity: Not on file  Other Topics Concern  . Not on file  Social History Narrative   Lives at home alone.   Right-handed.   Occasional use of caffeine.     PHYSICAL EXAM  Vitals:   06/14/18 0833  BP: 138/75  Pulse: 85  Weight: 217 lb (98.4 kg)  Height: 5\' 5"  (3.419 m)    Body mass index is 36.11 kg/m.   General: The patient is well-developed and well-nourished and in no acute distress  Eyes:  Funduscopic exam shows normal optic discs and retinal vessels.  Neck: The neck is supple, no carotid bruits are noted.  She has mild tenderness over the paraspinal muscles..  Cardiovascular: The heart has a regular rate and rhythm with a normal S1 and S2. There were no murmurs, gallops or rubs. Lungs are clear to auscultation.  Skin: Extremities are without significant edema.  Musculoskeletal: The back and hips are tender over the trochanteric bursae bilaterally and the right greater than left piriformis muscles.  She also has tenderness in many of the classic fibromyalgia tender points of the upper chest and back.  Neurologic Exam  Mental status: The patient is alert and oriented x 3 at the time of the examination. The patient has apparent normal recent and remote memory, with an apparently normal attention span and concentration ability.   Speech is normal.  Cranial nerves: Extraocular movements are full. Pupils are equal, round, and reactive to light and accomodation.  Visual fields are full.  Facial symmetry is present. There is good facial sensation to soft touch bilaterally.Facial  strength is normal.  Trapezius and sternocleidomastoid strength is normal. No dysarthria is noted.  The tongue is midline, and the patient has symmetric elevation of the soft palate. No obvious hearing deficits are noted.  Motor:  Muscle  bulk is normal.   Tone is normal. Strength is  5 / 5 in all 4 extremities.   Sensory: Sensory testing is intact to pinprick, soft touch and vibration sensation in the arms.  She has reduced sensation to touch and temperature over the L5 distributions of both feet.  Coordination: Cerebellar testing reveals good finger-nose-finger and heel-to-shin bilaterally.  Gait and station: Station is normal.   Gait is normal. Tandem gait is mildly wide.. Romberg is negative.   Reflexes: Deep tendon reflexes are symmetric and normal bilaterally.   Plantar responses are flexor.    DIAGNOSTIC DATA (LABS, IMAGING, TESTING) - I reviewed patient records, labs, notes, testing and imaging myself where available.  Lab Results  Component Value Date   WBC 16.3 (H) 09/02/2014   HGB 13.2 09/02/2014   HCT 38.9 09/02/2014   MCV 83.5 09/02/2014   PLT 408 (H) 09/02/2014      Component Value Date/Time   NA 135 09/02/2014 1330   K 3.3 (L) 09/02/2014 1330   CL 99 09/02/2014 1330   CO2 24 09/02/2014 1330   GLUCOSE 178 (H) 09/02/2014 1330   BUN 8 09/02/2014 1330   CREATININE 0.74 09/02/2014 1330   CALCIUM 9.0 09/02/2014 1330   PROT 7.9 09/02/2014 1330   ALBUMIN 3.5 09/02/2014 1330   AST 19 09/02/2014 1330   ALT 13 09/02/2014 1330   ALKPHOS 90 09/02/2014 1330   BILITOT 0.5 09/02/2014 1330   GFRNONAA 89 (L) 09/02/2014 1330   GFRAA >90 09/02/2014 1330   No results found for: CHOL, HDL, LDLCALC, LDLDIRECT, TRIG, CHOLHDL No results found for: HGBA1C No results found for: VITAMINB12 Lab Results  Component Value Date   TSH HEMOLYZED SPECIMEN, RESULTS MAY BE AFFECTED 09/02/2014       ASSESSMENT AND PLAN  Chronic right-sided low back pain, unspecified whether sciatica present - Plan: MR LUMBAR SPINE WO CONTRAST  Fibromyalgia  Lumbar radiculopathy - Plan: MR LUMBAR SPINE WO CONTRAST  Meniere's disease, unspecified laterality  Insomnia, unspecified type  OSA (obstructive sleep apnea)  Atrial  fibrillation, unspecified type ([place])   In summary, Mrs. Desmith is a 67 year old woman with a long history of back pain and fibromyalgia.  On examination, she has tenderness over many of the classic fibromyalgia tender points and also over the trochanteric bursae and the piriformis muscles of the hip and back.  She also has reduced sensation in an L5 distribution of the feet.  I will place her on Flexeril 10 mg nightly as this is helped her some in the past.  She would prefer not to go on gabapentin that might help her fibromyalgia pain and sleep issues.  She has not tried duloxetine for fibromyalgia and we could consider that in the future.  We need to check an MRI of the lumbar spine to determine if she has disc herniation or significant foraminal narrowing or spinal stenosis that might respond to fluoroscopically guided pain management procedures or surgery.Marland Kitchen  She will return to see me in 2 to 3 months but we will call her with the results of the MRI.  We may bring her in sooner or schedule further testing or procedures based on the results.  Thank you for asking me to see Ms. Hollabaugh.  Please let  me know if I can be of further assistance with her or other patients in the future.   Richard A. Felecia Shelling, MD, [facility] 06/98/6148, 30:73 PM Certified in Neurology, Clinical Neurophysiology, Sleep Medicine, Pain Medicine and Neuroimaging  [facility] [address], [place], [place] [address] [phone]

## 2018-06-17 ENCOUNTER — Telehealth: Payer: Self-pay | Admitting: Neurology

## 2018-06-17 NOTE — Telephone Encounter (Signed)
I called and provided the patient with the number to GI, [phone]. DW  °

## 2018-06-17 NOTE — Telephone Encounter (Signed)
Medicare/BCBS fed order sent to GI. No auth they will reach out to the pt to schedule.  °

## 2018-06-20 ENCOUNTER — Ambulatory Visit: Payer: Self-pay | Admitting: Licensed Clinical Social Worker

## 2018-06-26 ENCOUNTER — Ambulatory Visit
Admission: RE | Admit: 2018-06-26 | Discharge: 2018-06-26 | Disposition: A | Discharge status: Home / Self Care | Payer: Medicare Other | Source: Ambulatory Visit | Attending: Neurology | Admitting: Neurology

## 2018-06-26 DIAGNOSIS — H20022 Recurrent acute iridocyclitis, left eye: Secondary | ICD-10-CM | POA: Diagnosis not present

## 2018-06-26 DIAGNOSIS — M545 Low back pain: Secondary | ICD-10-CM | POA: Diagnosis not present

## 2018-06-26 DIAGNOSIS — M5416 Radiculopathy, lumbar region: Secondary | ICD-10-CM

## 2018-06-26 DIAGNOSIS — G8929 Other chronic pain: Secondary | ICD-10-CM | POA: Diagnosis not present

## 2018-07-01 ENCOUNTER — Telehealth: Payer: Self-pay | Admitting: *Deleted

## 2018-07-01 DIAGNOSIS — M5417 Radiculopathy, lumbosacral region: Secondary | ICD-10-CM

## 2018-07-01 NOTE — Telephone Encounter (Signed)
I called patient back and relayed Dr. Garth Bigness recommendations. She is agreeable to try this. She will keep [place] updated as to whether it helps relieve her sx. She knows order placed and she will be contacted by [place] [facility] to be scheduled.

## 2018-07-01 NOTE — Telephone Encounter (Signed)
Spoke with Dr. Felecia Shelling- can order ESI for L4-L5 level for clinical L5 radiculopathy.

## 2018-07-01 NOTE — Addendum Note (Signed)
Addended by: Hope Pigeon on: 07/01/2018 03:40 PM   Modules accepted: Orders

## 2018-07-01 NOTE — Telephone Encounter (Signed)
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  None of the changes appears to compress any of the nerves on the right though the herniated disc is next to one of the nerve roots on the left (left L1) which goes towards the left hip.

## 2018-07-01 NOTE — Telephone Encounter (Signed)
Called pt and relayed results per Dr. Felecia Shelling. She is still in a significant amount of pain. She had to stop activity today and go home. She is currently laying on a heating pad. PCP stopped prescribing tramadol d/t her seeing Dr. Felecia Shelling now. She is wanting to know who should manage this. Advised Dr. Felecia Shelling normally does not do long term pain management. He may want her to come back in for OV to discuss next steps. Advised I will discuss with him and call her back.

## 2018-07-04 ENCOUNTER — Ambulatory Visit (INDEPENDENT_AMBULATORY_CARE_PROVIDER_SITE_OTHER): Payer: Medicare Other | Admitting: Licensed Clinical Social Worker

## 2018-07-04 DIAGNOSIS — F3341 Major depressive disorder, recurrent, in partial remission: Secondary | ICD-10-CM | POA: Diagnosis not present

## 2018-07-18 ENCOUNTER — Ambulatory Visit: Payer: Federal, State, Local not specified - PPO | Admitting: Licensed Clinical Social Worker

## 2018-07-19 DIAGNOSIS — M5124 Other intervertebral disc displacement, thoracic region: Secondary | ICD-10-CM | POA: Diagnosis not present

## 2018-07-24 DIAGNOSIS — H209 Unspecified iridocyclitis: Secondary | ICD-10-CM | POA: Diagnosis not present

## 2018-07-25 DIAGNOSIS — M545 Low back pain: Secondary | ICD-10-CM | POA: Diagnosis not present

## 2018-07-25 DIAGNOSIS — R262 Difficulty in walking, not elsewhere classified: Secondary | ICD-10-CM | POA: Diagnosis not present

## 2018-07-25 DIAGNOSIS — M2560 Stiffness of unspecified joint, not elsewhere classified: Secondary | ICD-10-CM | POA: Diagnosis not present

## 2018-07-25 DIAGNOSIS — M62830 Muscle spasm of back: Secondary | ICD-10-CM | POA: Diagnosis not present

## 2018-07-29 DIAGNOSIS — R262 Difficulty in walking, not elsewhere classified: Secondary | ICD-10-CM | POA: Diagnosis not present

## 2018-07-29 DIAGNOSIS — M545 Low back pain: Secondary | ICD-10-CM | POA: Diagnosis not present

## 2018-07-29 DIAGNOSIS — M62830 Muscle spasm of back: Secondary | ICD-10-CM | POA: Diagnosis not present

## 2018-07-29 DIAGNOSIS — M2560 Stiffness of unspecified joint, not elsewhere classified: Secondary | ICD-10-CM | POA: Diagnosis not present

## 2018-07-31 DIAGNOSIS — M545 Low back pain: Secondary | ICD-10-CM | POA: Diagnosis not present

## 2018-07-31 DIAGNOSIS — L89892 Pressure ulcer of other site, stage 2: Secondary | ICD-10-CM | POA: Diagnosis not present

## 2018-07-31 DIAGNOSIS — E11621 Type 2 diabetes mellitus with foot ulcer: Secondary | ICD-10-CM | POA: Diagnosis not present

## 2018-07-31 DIAGNOSIS — E119 Type 2 diabetes mellitus without complications: Secondary | ICD-10-CM | POA: Diagnosis not present

## 2018-07-31 DIAGNOSIS — R262 Difficulty in walking, not elsewhere classified: Secondary | ICD-10-CM | POA: Diagnosis not present

## 2018-07-31 DIAGNOSIS — M79672 Pain in left foot: Secondary | ICD-10-CM | POA: Diagnosis not present

## 2018-07-31 DIAGNOSIS — M2560 Stiffness of unspecified joint, not elsewhere classified: Secondary | ICD-10-CM | POA: Diagnosis not present

## 2018-07-31 DIAGNOSIS — M62830 Muscle spasm of back: Secondary | ICD-10-CM | POA: Diagnosis not present

## 2018-07-31 DIAGNOSIS — M79671 Pain in right foot: Secondary | ICD-10-CM | POA: Diagnosis not present

## 2018-07-31 DIAGNOSIS — B351 Tinea unguium: Secondary | ICD-10-CM | POA: Diagnosis not present

## 2018-08-01 ENCOUNTER — Ambulatory Visit (INDEPENDENT_AMBULATORY_CARE_PROVIDER_SITE_OTHER): Payer: Medicare Other | Admitting: Licensed Clinical Social Worker

## 2018-08-01 DIAGNOSIS — M62830 Muscle spasm of back: Secondary | ICD-10-CM | POA: Diagnosis not present

## 2018-08-01 DIAGNOSIS — F3341 Major depressive disorder, recurrent, in partial remission: Secondary | ICD-10-CM

## 2018-08-01 DIAGNOSIS — R262 Difficulty in walking, not elsewhere classified: Secondary | ICD-10-CM | POA: Diagnosis not present

## 2018-08-01 DIAGNOSIS — M2560 Stiffness of unspecified joint, not elsewhere classified: Secondary | ICD-10-CM | POA: Diagnosis not present

## 2018-08-01 DIAGNOSIS — M545 Low back pain: Secondary | ICD-10-CM | POA: Diagnosis not present

## 2018-08-02 ENCOUNTER — Ambulatory Visit
Admission: RE | Admit: 2018-08-02 | Discharge: 2018-08-02 | Disposition: A | Discharge status: Home / Self Care | Payer: Medicare Other | Source: Ambulatory Visit | Attending: Neurology | Admitting: Neurology

## 2018-08-02 DIAGNOSIS — M47817 Spondylosis without myelopathy or radiculopathy, lumbosacral region: Secondary | ICD-10-CM | POA: Diagnosis not present

## 2018-08-02 DIAGNOSIS — M5417 Radiculopathy, lumbosacral region: Secondary | ICD-10-CM

## 2018-08-02 MED ORDER — METHYLPREDNISOLONE ACETATE 40 MG/ML INJ SUSP (RADIOLOG
120.0000 mg | Freq: Once | INTRAMUSCULAR | Status: AC
  Administered 2018-08-02: 120 mg via EPIDURAL

## 2018-08-02 MED ORDER — IOPAMIDOL (ISOVUE-M 200) INJECTION 41%
1.0000 mL | Freq: Once | INTRAMUSCULAR | Status: AC
  Administered 2018-08-02: 1 mL via EPIDURAL

## 2018-08-02 NOTE — Discharge Instructions (Signed)

## 2018-08-05 DIAGNOSIS — M2560 Stiffness of unspecified joint, not elsewhere classified: Secondary | ICD-10-CM | POA: Diagnosis not present

## 2018-08-05 DIAGNOSIS — R262 Difficulty in walking, not elsewhere classified: Secondary | ICD-10-CM | POA: Diagnosis not present

## 2018-08-05 DIAGNOSIS — M545 Low back pain: Secondary | ICD-10-CM | POA: Diagnosis not present

## 2018-08-05 DIAGNOSIS — M62830 Muscle spasm of back: Secondary | ICD-10-CM | POA: Diagnosis not present

## 2018-08-07 DIAGNOSIS — M545 Low back pain: Secondary | ICD-10-CM | POA: Diagnosis not present

## 2018-08-07 DIAGNOSIS — M62830 Muscle spasm of back: Secondary | ICD-10-CM | POA: Diagnosis not present

## 2018-08-07 DIAGNOSIS — R262 Difficulty in walking, not elsewhere classified: Secondary | ICD-10-CM | POA: Diagnosis not present

## 2018-08-07 DIAGNOSIS — M2560 Stiffness of unspecified joint, not elsewhere classified: Secondary | ICD-10-CM | POA: Diagnosis not present

## 2018-08-12 DIAGNOSIS — R262 Difficulty in walking, not elsewhere classified: Secondary | ICD-10-CM | POA: Diagnosis not present

## 2018-08-12 DIAGNOSIS — M62830 Muscle spasm of back: Secondary | ICD-10-CM | POA: Diagnosis not present

## 2018-08-12 DIAGNOSIS — M545 Low back pain: Secondary | ICD-10-CM | POA: Diagnosis not present

## 2018-08-12 DIAGNOSIS — M2560 Stiffness of unspecified joint, not elsewhere classified: Secondary | ICD-10-CM | POA: Diagnosis not present

## 2018-08-14 DIAGNOSIS — M545 Low back pain: Secondary | ICD-10-CM | POA: Diagnosis not present

## 2018-08-14 DIAGNOSIS — R262 Difficulty in walking, not elsewhere classified: Secondary | ICD-10-CM | POA: Diagnosis not present

## 2018-08-14 DIAGNOSIS — M62830 Muscle spasm of back: Secondary | ICD-10-CM | POA: Diagnosis not present

## 2018-08-14 DIAGNOSIS — M2560 Stiffness of unspecified joint, not elsewhere classified: Secondary | ICD-10-CM | POA: Diagnosis not present

## 2018-08-21 DIAGNOSIS — M545 Low back pain: Secondary | ICD-10-CM | POA: Diagnosis not present

## 2018-08-21 DIAGNOSIS — R262 Difficulty in walking, not elsewhere classified: Secondary | ICD-10-CM | POA: Diagnosis not present

## 2018-08-21 DIAGNOSIS — M62830 Muscle spasm of back: Secondary | ICD-10-CM | POA: Diagnosis not present

## 2018-08-21 DIAGNOSIS — M2560 Stiffness of unspecified joint, not elsewhere classified: Secondary | ICD-10-CM | POA: Diagnosis not present

## 2018-08-23 DIAGNOSIS — M62830 Muscle spasm of back: Secondary | ICD-10-CM | POA: Diagnosis not present

## 2018-08-23 DIAGNOSIS — R262 Difficulty in walking, not elsewhere classified: Secondary | ICD-10-CM | POA: Diagnosis not present

## 2018-08-23 DIAGNOSIS — M2560 Stiffness of unspecified joint, not elsewhere classified: Secondary | ICD-10-CM | POA: Diagnosis not present

## 2018-08-23 DIAGNOSIS — M545 Low back pain: Secondary | ICD-10-CM | POA: Diagnosis not present

## 2018-08-26 DIAGNOSIS — M2560 Stiffness of unspecified joint, not elsewhere classified: Secondary | ICD-10-CM | POA: Diagnosis not present

## 2018-08-26 DIAGNOSIS — M62830 Muscle spasm of back: Secondary | ICD-10-CM | POA: Diagnosis not present

## 2018-08-26 DIAGNOSIS — M545 Low back pain: Secondary | ICD-10-CM | POA: Diagnosis not present

## 2018-08-26 DIAGNOSIS — R262 Difficulty in walking, not elsewhere classified: Secondary | ICD-10-CM | POA: Diagnosis not present

## 2018-08-28 DIAGNOSIS — M545 Low back pain: Secondary | ICD-10-CM | POA: Diagnosis not present

## 2018-08-28 DIAGNOSIS — M2560 Stiffness of unspecified joint, not elsewhere classified: Secondary | ICD-10-CM | POA: Diagnosis not present

## 2018-08-28 DIAGNOSIS — R262 Difficulty in walking, not elsewhere classified: Secondary | ICD-10-CM | POA: Diagnosis not present

## 2018-08-28 DIAGNOSIS — M62830 Muscle spasm of back: Secondary | ICD-10-CM | POA: Diagnosis not present

## 2018-08-29 ENCOUNTER — Ambulatory Visit (INDEPENDENT_AMBULATORY_CARE_PROVIDER_SITE_OTHER): Payer: Medicare Other | Admitting: Licensed Clinical Social Worker

## 2018-08-29 DIAGNOSIS — F3341 Major depressive disorder, recurrent, in partial remission: Secondary | ICD-10-CM

## 2018-09-02 DIAGNOSIS — M545 Low back pain: Secondary | ICD-10-CM | POA: Diagnosis not present

## 2018-09-02 DIAGNOSIS — M62830 Muscle spasm of back: Secondary | ICD-10-CM | POA: Diagnosis not present

## 2018-09-02 DIAGNOSIS — R262 Difficulty in walking, not elsewhere classified: Secondary | ICD-10-CM | POA: Diagnosis not present

## 2018-09-02 DIAGNOSIS — M2560 Stiffness of unspecified joint, not elsewhere classified: Secondary | ICD-10-CM | POA: Diagnosis not present

## 2018-09-04 DIAGNOSIS — M545 Low back pain: Secondary | ICD-10-CM | POA: Diagnosis not present

## 2018-09-04 DIAGNOSIS — R262 Difficulty in walking, not elsewhere classified: Secondary | ICD-10-CM | POA: Diagnosis not present

## 2018-09-04 DIAGNOSIS — M2560 Stiffness of unspecified joint, not elsewhere classified: Secondary | ICD-10-CM | POA: Diagnosis not present

## 2018-09-04 DIAGNOSIS — M62830 Muscle spasm of back: Secondary | ICD-10-CM | POA: Diagnosis not present

## 2018-09-09 DIAGNOSIS — I10 Essential (primary) hypertension: Secondary | ICD-10-CM | POA: Diagnosis not present

## 2018-09-09 DIAGNOSIS — J4521 Mild intermittent asthma with (acute) exacerbation: Secondary | ICD-10-CM | POA: Diagnosis not present

## 2018-09-09 DIAGNOSIS — G4733 Obstructive sleep apnea (adult) (pediatric): Secondary | ICD-10-CM | POA: Diagnosis not present

## 2018-09-09 DIAGNOSIS — G4730 Sleep apnea, unspecified: Secondary | ICD-10-CM | POA: Diagnosis not present

## 2018-09-09 DIAGNOSIS — I483 Typical atrial flutter: Secondary | ICD-10-CM | POA: Diagnosis not present

## 2018-09-09 DIAGNOSIS — I482 Chronic atrial fibrillation, unspecified: Secondary | ICD-10-CM | POA: Diagnosis not present

## 2018-09-09 DIAGNOSIS — E0800 Diabetes mellitus due to underlying condition with hyperosmolarity without nonketotic hyperglycemic-hyperosmolar coma (NKHHC): Secondary | ICD-10-CM | POA: Diagnosis not present

## 2018-09-10 DIAGNOSIS — I483 Typical atrial flutter: Secondary | ICD-10-CM | POA: Diagnosis not present

## 2018-09-10 DIAGNOSIS — I482 Chronic atrial fibrillation, unspecified: Secondary | ICD-10-CM | POA: Diagnosis not present

## 2018-09-10 DIAGNOSIS — R002 Palpitations: Secondary | ICD-10-CM | POA: Diagnosis not present

## 2018-09-19 ENCOUNTER — Ambulatory Visit: Payer: Medicare Other | Admitting: Neurology

## 2018-09-23 DIAGNOSIS — I483 Typical atrial flutter: Secondary | ICD-10-CM | POA: Diagnosis not present

## 2018-09-23 DIAGNOSIS — I482 Chronic atrial fibrillation, unspecified: Secondary | ICD-10-CM | POA: Diagnosis not present

## 2018-09-25 DIAGNOSIS — G4733 Obstructive sleep apnea (adult) (pediatric): Secondary | ICD-10-CM | POA: Diagnosis not present

## 2018-09-25 DIAGNOSIS — I4891 Unspecified atrial fibrillation: Secondary | ICD-10-CM | POA: Diagnosis not present

## 2018-09-25 DIAGNOSIS — E7800 Pure hypercholesterolemia, unspecified: Secondary | ICD-10-CM | POA: Diagnosis not present

## 2018-09-25 DIAGNOSIS — E119 Type 2 diabetes mellitus without complications: Secondary | ICD-10-CM | POA: Diagnosis not present

## 2018-09-25 DIAGNOSIS — E559 Vitamin D deficiency, unspecified: Secondary | ICD-10-CM | POA: Diagnosis not present

## 2018-09-25 DIAGNOSIS — E1140 Type 2 diabetes mellitus with diabetic neuropathy, unspecified: Secondary | ICD-10-CM | POA: Diagnosis not present

## 2018-09-25 DIAGNOSIS — E612 Magnesium deficiency: Secondary | ICD-10-CM | POA: Diagnosis not present

## 2018-09-25 DIAGNOSIS — K219 Gastro-esophageal reflux disease without esophagitis: Secondary | ICD-10-CM | POA: Diagnosis not present

## 2018-09-25 DIAGNOSIS — M545 Low back pain: Secondary | ICD-10-CM | POA: Diagnosis not present

## 2018-09-26 ENCOUNTER — Ambulatory Visit: Payer: Medicare Other | Admitting: Licensed Clinical Social Worker

## 2018-09-30 DIAGNOSIS — Z23 Encounter for immunization: Secondary | ICD-10-CM | POA: Diagnosis not present

## 2018-09-30 DIAGNOSIS — Z0000 Encounter for general adult medical examination without abnormal findings: Secondary | ICD-10-CM | POA: Diagnosis not present

## 2018-09-30 DIAGNOSIS — E612 Magnesium deficiency: Secondary | ICD-10-CM | POA: Diagnosis not present

## 2018-09-30 DIAGNOSIS — K219 Gastro-esophageal reflux disease without esophagitis: Secondary | ICD-10-CM | POA: Diagnosis not present

## 2018-09-30 DIAGNOSIS — E7800 Pure hypercholesterolemia, unspecified: Secondary | ICD-10-CM | POA: Diagnosis not present

## 2018-09-30 DIAGNOSIS — M545 Low back pain: Secondary | ICD-10-CM | POA: Diagnosis not present

## 2018-09-30 DIAGNOSIS — I4891 Unspecified atrial fibrillation: Secondary | ICD-10-CM | POA: Diagnosis not present

## 2018-09-30 DIAGNOSIS — E119 Type 2 diabetes mellitus without complications: Secondary | ICD-10-CM | POA: Diagnosis not present

## 2018-09-30 DIAGNOSIS — E1140 Type 2 diabetes mellitus with diabetic neuropathy, unspecified: Secondary | ICD-10-CM | POA: Diagnosis not present

## 2018-09-30 DIAGNOSIS — E559 Vitamin D deficiency, unspecified: Secondary | ICD-10-CM | POA: Diagnosis not present

## 2018-09-30 DIAGNOSIS — Z1211 Encounter for screening for malignant neoplasm of colon: Secondary | ICD-10-CM | POA: Diagnosis not present

## 2018-10-08 ENCOUNTER — Other Ambulatory Visit: Payer: Self-pay | Admitting: Neurology

## 2018-10-16 DIAGNOSIS — Z1211 Encounter for screening for malignant neoplasm of colon: Secondary | ICD-10-CM | POA: Diagnosis not present

## 2018-10-31 ENCOUNTER — Ambulatory Visit: Payer: Medicare Other | Admitting: Licensed Clinical Social Worker

## 2018-11-01 DIAGNOSIS — L89892 Pressure ulcer of other site, stage 2: Secondary | ICD-10-CM | POA: Diagnosis not present

## 2018-11-01 DIAGNOSIS — E11621 Type 2 diabetes mellitus with foot ulcer: Secondary | ICD-10-CM | POA: Diagnosis not present

## 2018-11-01 DIAGNOSIS — E119 Type 2 diabetes mellitus without complications: Secondary | ICD-10-CM | POA: Diagnosis not present

## 2018-11-01 DIAGNOSIS — B351 Tinea unguium: Secondary | ICD-10-CM | POA: Diagnosis not present

## 2018-11-01 DIAGNOSIS — M79672 Pain in left foot: Secondary | ICD-10-CM | POA: Diagnosis not present

## 2018-11-01 DIAGNOSIS — M79671 Pain in right foot: Secondary | ICD-10-CM | POA: Diagnosis not present

## 2018-11-20 DIAGNOSIS — Z7901 Long term (current) use of anticoagulants: Secondary | ICD-10-CM | POA: Diagnosis not present

## 2018-11-20 DIAGNOSIS — G4733 Obstructive sleep apnea (adult) (pediatric): Secondary | ICD-10-CM | POA: Diagnosis not present

## 2018-11-20 DIAGNOSIS — Z79899 Other long term (current) drug therapy: Secondary | ICD-10-CM | POA: Diagnosis not present

## 2018-11-20 DIAGNOSIS — I480 Paroxysmal atrial fibrillation: Secondary | ICD-10-CM | POA: Diagnosis not present

## 2018-12-16 DIAGNOSIS — R0902 Hypoxemia: Secondary | ICD-10-CM | POA: Diagnosis not present

## 2018-12-16 DIAGNOSIS — G4733 Obstructive sleep apnea (adult) (pediatric): Secondary | ICD-10-CM | POA: Diagnosis not present

## 2019-01-24 DIAGNOSIS — R110 Nausea: Secondary | ICD-10-CM | POA: Diagnosis not present

## 2019-01-24 DIAGNOSIS — H8101 Meniere's disease, right ear: Secondary | ICD-10-CM | POA: Diagnosis not present

## 2019-01-24 DIAGNOSIS — Z6833 Body mass index (BMI) 33.0-33.9, adult: Secondary | ICD-10-CM | POA: Diagnosis not present

## 2019-01-24 DIAGNOSIS — R42 Dizziness and giddiness: Secondary | ICD-10-CM | POA: Diagnosis not present

## 2019-01-31 DIAGNOSIS — M79671 Pain in right foot: Secondary | ICD-10-CM | POA: Diagnosis not present

## 2019-01-31 DIAGNOSIS — B351 Tinea unguium: Secondary | ICD-10-CM | POA: Diagnosis not present

## 2019-01-31 DIAGNOSIS — M79672 Pain in left foot: Secondary | ICD-10-CM | POA: Diagnosis not present

## 2019-01-31 DIAGNOSIS — E11621 Type 2 diabetes mellitus with foot ulcer: Secondary | ICD-10-CM | POA: Diagnosis not present

## 2019-01-31 DIAGNOSIS — L89892 Pressure ulcer of other site, stage 2: Secondary | ICD-10-CM | POA: Diagnosis not present

## 2019-01-31 DIAGNOSIS — E119 Type 2 diabetes mellitus without complications: Secondary | ICD-10-CM | POA: Diagnosis not present

## 2019-03-26 ENCOUNTER — Encounter: Payer: Medicare Other | Attending: Family Medicine | Admitting: *Deleted

## 2019-03-26 ENCOUNTER — Other Ambulatory Visit: Payer: Self-pay

## 2019-03-26 DIAGNOSIS — E118 Type 2 diabetes mellitus with unspecified complications: Secondary | ICD-10-CM | POA: Insufficient documentation

## 2019-03-26 NOTE — Patient Instructions (Signed)
Plan:  Aim for 2 Carb Choices per meal (30 grams) +/- 1 either way  Aim for 0-1 Carbs per snack if hungry  Include protein in moderation with your meals and snacks Consider reading food labels for Total Carbohydrate of foods Consider  increasing your activity level by doing some chair exercises found on You Tube for 15-30 minutes daily as tolerated Continue checking BG with your Libre at alternate times per day  Continue taking medication as directed by MD

## 2019-03-26 NOTE — Progress Notes (Signed)
Diabetes Self-Management Education  Visit Type: First/Initial  Appt. Start Time: 0930 Appt. End Time: 1100  03/26/2019  Suzanne Greer, identified by name and date of birth, is a 68 y.o. female with a diagnosis of Diabetes: Type 2. Patient states she has been following the Next 56 Days" Diet for the past several months but occasionally falls off and binge eats foods she has been depriving herself of. The Diet appears to be low Glycemic Index with limits on processed foods and higher carbohydrate foods. Patient would like suggestions on eating for diabetes and would like to understand exactly what diabetes is. Also interested in knowing how her medication works.  ASSESSMENT  There were no vitals taken for this visit. There is no height or weight on file to calculate BMI.  Diabetes Self-Management Education - 03/26/19 0948      Visit Information   Visit Type  First/Initial      Initial Visit   Diabetes Type  Type 2    Are you currently following a meal plan?  Yes   "Next 56 Days"   Are you taking your medications as prescribed?  Yes    Date Diagnosed  10 years ago      Health Coping   How would you rate your overall health?  Poor      Psychosocial Assessment   Patient Belief/Attitude about Diabetes  Motivated to manage diabetes    Self-care barriers  None    Other persons present  Patient    Patient Concerns  Nutrition/Meal planning;Weight Control;Glycemic Control    Special Needs  None    Preferred Learning Style  Auditory;Visual;Hands on    Power in progress    What is the last grade level you completed in school?  Bachelor's Degree      Pre-Education Assessment   Patient understands the diabetes disease and treatment process.  Needs Instruction    Patient understands incorporating nutritional management into lifestyle.  Needs Review    Patient undertands incorporating physical activity into lifestyle.  Needs Instruction    Patient understands  using medications safely.  Needs Review    Patient understands monitoring blood glucose, interpreting and using results  Needs Review    Patient understands prevention, detection, and treatment of acute complications.  Needs Instruction    Patient understands prevention, detection, and treatment of chronic complications.  Needs Instruction    Patient understands how to develop strategies to address psychosocial issues.  Needs Instruction    Patient understands how to develop strategies to promote health/change behavior.  Needs Instruction      Complications   Last HgB A1C per patient/outside source  7.6 %    How often do you check your blood sugar?  3-4 times/day   Has Freestyle Libre   Fasting Blood glucose range (mg/dL)  70-129    Postprandial Blood glucose range (mg/dL)  70-129;130-179;180-200    Number of hypoglycemic episodes per month  0    Have you had a dilated eye exam in the past 12 months?  Yes    Have you had a dental exam in the past 12 months?  Yes    Are you checking your feet?  Yes    How many days per week are you checking your feet?  1      Dietary Intake   Breakfast  P Protein shake with almond milk, frozen berries    Lunch  another P Protein shake if PB2 added  if  not preparing food OR 1/2 head of lettuce and tomatoes with lite [employer] dressing with blueberries    Snack (afternoon)  apples or maybe blueberries    Dinner  lean meat usually, quinoa as main starch, no other starches when on current diet, salad or vegetables usually    Snack (evening)  applesauce OR apple OR handful of nuts OR    Beverage(s)  has resumed decaf coffee, water, vitamin water, Hint water,      Exercise   Exercise Type  ADL's   hip pain so very little walking   How many days per week to you exercise?  0      Patient Education   Previous Diabetes Education  Yes (please comment)    Disease state   Definition of diabetes, type 1 and 2, and the diagnosis of diabetes;Factors that  contribute to the development of diabetes    Nutrition management   Role of diet in the treatment of diabetes and the relationship between the three main macronutrients and blood glucose level;Food label reading, portion sizes and measuring food.;Carbohydrate counting;Reviewed blood glucose goals for pre and post meals and how to evaluate the patients' food intake on their blood glucose level.    Physical activity and exercise   Role of exercise on diabetes management, blood pressure control and cardiac health.;Helped patient identify appropriate exercises in relation to his/her diabetes, diabetes complications and other health issue.    Medications  Reviewed patients medication for diabetes, action, purpose, timing of dose and side effects.    Monitoring  Identified appropriate SMBG and/or A1C goals.      Individualized Goals (developed by patient)   Nutrition  Follow meal plan discussed    Physical Activity  Exercise 3-5 times per week    Medications  take my medication as prescribed    Monitoring   test blood glucose pre and post meals as discussed      Post-Education Assessment   Patient understands the diabetes disease and treatment process.  Demonstrates understanding / competency    Patient understands incorporating nutritional management into lifestyle.  Demonstrates understanding / competency    Patient undertands incorporating physical activity into lifestyle.  Demonstrates understanding / competency    Patient understands using medications safely.  Demonstrates understanding / competency    Patient understands monitoring blood glucose, interpreting and using results  Demonstrates understanding / competency    Patient understands prevention, detection, and treatment of acute complications.  Demonstrates understanding / competency    Patient understands prevention, detection, and treatment of chronic complications.  Demonstrates understanding / competency    Patient understands how to  develop strategies to address psychosocial issues.  Demonstrates understanding / competency    Patient understands how to develop strategies to promote health/change behavior.  Demonstrates understanding / competency      Outcomes   Expected Outcomes  Demonstrated interest in learning. Expect positive outcomes    Future DMSE  4-6 wks    Program Status  Not Completed       Individualized Plan for Diabetes Self-Management Training:   Learning Objective:  Patient will have a greater understanding of diabetes self-management. Patient education plan is to attend individual and/or group sessions per assessed needs and concerns.   Plan:   Patient Instructions  Plan:  Aim for 2 Carb Choices per meal (30 grams) +/- 1 either way  Aim for 0-1 Carbs per snack if hungry  Include protein in moderation with your meals and snacks Consider reading  food labels for Total Carbohydrate of foods Consider  increasing your activity level by doing some chair exercises found on You Tube for 15-30 minutes daily as tolerated Continue checking BG with your Libre at alternate times per day  Continue taking medication as directed by MD  Expected Outcomes:  Demonstrated interest in learning. Expect positive outcomes  Education material provided: Food label handouts, A1C conversion sheet, Meal plan card, Snack sheet and Carbohydrate counting sheet  If problems or questions, patient to contact team via:  Phone  Future DSME appointment: 4-6 wks

## 2019-04-18 DIAGNOSIS — Z20828 Contact with and (suspected) exposure to other viral communicable diseases: Secondary | ICD-10-CM | POA: Diagnosis not present

## 2019-05-06 ENCOUNTER — Ambulatory Visit: Payer: Medicare Other | Admitting: *Deleted

## 2019-05-08 DIAGNOSIS — E11621 Type 2 diabetes mellitus with foot ulcer: Secondary | ICD-10-CM | POA: Diagnosis not present

## 2019-05-08 DIAGNOSIS — B351 Tinea unguium: Secondary | ICD-10-CM | POA: Diagnosis not present

## 2019-05-08 DIAGNOSIS — L89892 Pressure ulcer of other site, stage 2: Secondary | ICD-10-CM | POA: Diagnosis not present

## 2019-05-08 DIAGNOSIS — E119 Type 2 diabetes mellitus without complications: Secondary | ICD-10-CM | POA: Diagnosis not present

## 2019-05-08 DIAGNOSIS — M79671 Pain in right foot: Secondary | ICD-10-CM | POA: Diagnosis not present

## 2019-05-08 DIAGNOSIS — M79672 Pain in left foot: Secondary | ICD-10-CM | POA: Diagnosis not present

## 2019-05-28 DIAGNOSIS — G4733 Obstructive sleep apnea (adult) (pediatric): Secondary | ICD-10-CM | POA: Diagnosis not present

## 2019-05-28 DIAGNOSIS — I483 Typical atrial flutter: Secondary | ICD-10-CM | POA: Diagnosis not present

## 2019-05-28 DIAGNOSIS — J4521 Mild intermittent asthma with (acute) exacerbation: Secondary | ICD-10-CM | POA: Diagnosis not present

## 2019-05-28 DIAGNOSIS — I480 Paroxysmal atrial fibrillation: Secondary | ICD-10-CM | POA: Diagnosis not present

## 2019-05-28 DIAGNOSIS — M797 Fibromyalgia: Secondary | ICD-10-CM | POA: Diagnosis not present

## 2019-05-28 DIAGNOSIS — I10 Essential (primary) hypertension: Secondary | ICD-10-CM | POA: Diagnosis not present

## 2019-05-28 DIAGNOSIS — I482 Chronic atrial fibrillation, unspecified: Secondary | ICD-10-CM | POA: Diagnosis not present

## 2019-05-29 DIAGNOSIS — I482 Chronic atrial fibrillation, unspecified: Secondary | ICD-10-CM | POA: Diagnosis not present

## 2019-07-14 NOTE — Progress Notes (Signed)
Cardiology Office Note   Date:  07/21/2019   ID:  Suzanne, Greer 02/21/1951, MRN [id number]  PCP:  Suzanne Shanks, MD  Cardiologist:   Suzanne Brougher Martin[place], MD   Chief Complaint  Patient presents with  . New Patient (Initial Visit)  . Atrial Fibrillation      History of Present Illness: Suzanne Greer is a 69 y.o. female who is seen at the request of Dr Suzanne Greer for evaluation of Atrial fibrillation/flutter. She has a history of HTN, OSA, and DM. Previously followed by Dr Suzanne Greer in [facility]. Has been in Rhythmol and Eliquis for years. She had a remote Echo in 2011 that showed Mild LVH otherwise normal. She reports a Myoview study years ago at [facility] that apparently was normal. She is on CPAP therapy. She reports that in December she was having increased symptoms with Afib and was placed on diltiazem in addition to propafenone and metoprolol. She states she was having episodes at least 2 days a week. When her HR is over 100 she feels palpitations. When HR is up to 150 she feels extremely tired and has chest tightness and pressure like something sitting on her chest. She does have chronic dizziness but attributes this to Meniere's disease. She does not drink caffeine. She is unclear whether she may have had a cardioversion in the past. She is concerned about the number of medications she is taking and the cost of Eliquis which is $95/month for her. She does not have part D coverage.     Past Medical History:  Diagnosis Date  . Atrial fibrillation ([place])   . Back pain   . Diabetes mellitus   . Dysrhythmia    history Atrial Flutter. -Dr, Chiu-cardiology [facility] follows  . Fibromyalgia   . GERD (gastroesophageal reflux disease)   . Hip pain, bilateral   . Hx of seasonal allergies   . Hyperlipidemia   . Hypertension   . Insomnia   . 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 Obesity   . Restless legs   . Sleep apnea    no cpap  use now-unable to tolerate mask, surgery a yr ago  . Vertigo     Past Surgical History:  Procedure Laterality Date  . APPENDECTOMY    . BUNIONECTOMY    . ESOPHAGOGASTRODUODENOSCOPY (EGD) WITH PROPOFOL N/A 08/16/2015   Procedure: ESOPHAGOGASTRODUODENOSCOPY (EGD) WITH PROPOFOL;  Surgeon: Suzanne Fair, MD;  Location: [facility];  Service: Endoscopy;  Laterality: N/A;  . GANGLION CYST   1980   REMOVED   . HEMORROIDECTOMY    . THYROIDECTOMY, PARTIAL  2005   "partial for growth-benign"  . UPPER ENDOSCOPIC ULTRASOUND W/ FNA    . UVULECTOMY     excised about a yr ago [facility]     Current Outpatient Medications  Medication Sig Dispense Refill  . apixaban (ELIQUIS) 5 MG TABS tablet Take 5 mg by mouth 2 (two) times daily.    Marland Kitchen atorvastatin (LIPITOR) 20 MG tablet Take 20 mg by mouth daily.    . cyclobenzaprine (FLEXERIL) 10 MG tablet Take one tablet at bedtime.  Please call [phone] to schedule an appt. 30 tablet 1  . diltiazem (CARDIZEM) 120 MG tablet Take 120 mg by mouth daily.    . fluticasone (FLONASE) 50 MCG/ACT nasal spray Place 2 sprays into both nostrils daily.    . Magnesium 400 MG TABS Take 400 mg by mouth  daily.     . Melatonin 10 MG TABS Take 10 mg by mouth at bedtime.     . metFORMIN (GLUCOPHAGE-XR) 500 MG 24 hr tablet Take 500 mg by mouth 2 (two) times daily.     . metoprolol (TOPROL-XL) 50 MG 24 hr tablet Take 50 mg by mouth every morning.     . montelukast (SINGULAIR) 10 MG tablet Take 10 mg by mouth at bedtime.    . propafenone (RYTHMOL) 300 MG tablet Take 300 mg by mouth every 8 (eight) hours.    . traMADol (ULTRAM) 50 MG tablet Take 50 mg by mouth 3 (three) times daily as needed (Pain).     Marland Kitchen VITAMIN D PO Take 1 tablet by mouth daily.     No current facility-administered medications for this visit.    Allergies:   Yeast-related products, Molds & smuts, Penicillins, and Sulfa drugs cross reactors    Social History:  The patient  reports that she has  never smoked. She has never used smokeless tobacco. She reports that she does not drink alcohol or use drugs.   Family History:  The patient's family history includes Cancer (age of onset: 4) in her mother; Diabetes in her father; Drug abuse in her brother; Heart disease in her mother; Hepatitis C in her brother.    ROS:  Please see the history of present illness.   Otherwise, review of systems are positive for none.   All other systems are reviewed and negative.    PHYSICAL EXAM: VS:  BP 122/66 (BP Location: Right Arm, Patient Position: Sitting, Cuff Size: Large)   Pulse 69   Temp 98.4 F (36.9 C)   Ht 5' 5.5" (1.664 m)   Wt 222 lb (100.7 kg)   BMI 36.38 kg/m  , BMI Body mass index is 36.38 kg/m. GEN: Well nourished, overweight, in no acute distress  HEENT: normal  Neck: no JVD, carotid bruits, or masses Cardiac: RRR; no murmurs, rubs, or gallops,no edema  Respiratory:  clear to auscultation bilaterally, normal work of breathing GI: soft, nontender, nondistended, + BS MS: no deformity or atrophy  Skin: warm and dry, no rash Neuro:  Strength and sensation are intact Psych: euthymic mood, full affect   EKG:  EKG is ordered today. The ekg ordered today demonstrates NSR with rate 69. Normal. I have personally reviewed and interpreted this study.    Recent Labs: No results found for requested labs within last 8760 hours.    Lipid Panel No results found for: CHOL, TRIG, HDL, CHOLHDL, VLDL, LDLCALC, LDLDIRECT   Dated 09/25/18: A1c 7.6%. cholesterol 216, triglycerides 134, HDL 56, LDL 133. CMET and TSH normal.  Wt Readings from Last 3 Encounters:  07/21/19 222 lb (100.7 kg)  06/14/18 217 lb (98.4 kg)  08/16/15 205 lb (93 kg)      Other studies Reviewed: Additional studies/ records that were reviewed today include: none. Review of the above records demonstrates: N/A   ASSESSMENT AND PLAN:  1.  Paroxysmal Atrial fibrillation. Patient on long term AAD therapy with  propafenone. Noted increased symptoms in December that have improved with the addition of diltiazem. We discussed aspect of Afib care including rate control and AAD therapy. Since her symptoms have improved I would favor continuing on her current therapy and monitoring for awhile. If symptoms worsen we could consider switching to an alternative antiarrhythmic drug or even consider ablation. She has a [place] Vasc score of  3-4 so I would recommend long term anticoagulation  for stroke prophylaxis. She is going to investigate if other DOACs would be cheaper. If not then her only cost option would be to switch to Warfarin. We will update Echo. Follow up in 3-4 months 2. DM type 2 3. HLD. Last LDL 133 on 20 mg lipitor. Would recommend goal of at least 100 and preferably 70. She is going to work on her diet and have Dr Suzanne Greer recheck lipids. May need to consider higher dose statin.  4. OSA on CPAP. Stressed importance of keeping weight under control.   Current medicines are reviewed at length with the patient today.  The patient does not have concerns regarding medicines.  The following changes have been made:  no change  Labs/ tests ordered today include:   Orders Placed This Encounter  Procedures  . EKG 12-Lead  . ECHOCARDIOGRAM COMPLETE     Disposition:   FU with me in 3 months  Signed, Sabrine Patchen Martin[place], MD  07/21/2019 1:12 PM    [facility] [address], [place], [place], [address] Phone [phone], Fax [phone]

## 2019-07-21 ENCOUNTER — Ambulatory Visit (INDEPENDENT_AMBULATORY_CARE_PROVIDER_SITE_OTHER): Payer: Medicare Other | Admitting: Cardiology

## 2019-07-21 ENCOUNTER — Encounter: Payer: Self-pay | Admitting: Cardiology

## 2019-07-21 ENCOUNTER — Other Ambulatory Visit: Payer: Self-pay

## 2019-07-21 VITALS — BP 122/66 | HR 69 | Temp 98.4°F | Ht 65.5 in | Wt 222.0 lb

## 2019-07-21 DIAGNOSIS — E118 Type 2 diabetes mellitus with unspecified complications: Secondary | ICD-10-CM

## 2019-07-21 DIAGNOSIS — I480 Paroxysmal atrial fibrillation: Secondary | ICD-10-CM | POA: Diagnosis not present

## 2019-07-21 DIAGNOSIS — I4891 Unspecified atrial fibrillation: Secondary | ICD-10-CM | POA: Diagnosis not present

## 2019-07-21 DIAGNOSIS — G4733 Obstructive sleep apnea (adult) (pediatric): Secondary | ICD-10-CM | POA: Diagnosis not present

## 2019-07-21 DIAGNOSIS — E7800 Pure hypercholesterolemia, unspecified: Secondary | ICD-10-CM | POA: Diagnosis not present

## 2019-07-21 NOTE — Patient Instructions (Addendum)
Other anticoagulants to consider are  Xarelto 20 mg daily Pradaxa 150 mg twice a day  Or Warfarin (coumadin)  Schedule Echo   Follow up in 3 months    Monday 10/27/19 at 10:00 am

## 2019-07-28 ENCOUNTER — Ambulatory Visit: Payer: Self-pay

## 2019-07-30 ENCOUNTER — Other Ambulatory Visit: Payer: Self-pay | Admitting: Neurology

## 2019-07-30 ENCOUNTER — Ambulatory Visit (HOSPITAL_COMMUNITY): Payer: Medicare Other | Attending: Cardiology

## 2019-07-30 ENCOUNTER — Other Ambulatory Visit: Payer: Self-pay

## 2019-07-30 DIAGNOSIS — I4891 Unspecified atrial fibrillation: Secondary | ICD-10-CM | POA: Diagnosis not present

## 2019-08-01 DIAGNOSIS — I4891 Unspecified atrial fibrillation: Secondary | ICD-10-CM | POA: Diagnosis not present

## 2019-08-01 DIAGNOSIS — E1140 Type 2 diabetes mellitus with diabetic neuropathy, unspecified: Secondary | ICD-10-CM | POA: Diagnosis not present

## 2019-08-01 DIAGNOSIS — K219 Gastro-esophageal reflux disease without esophagitis: Secondary | ICD-10-CM | POA: Diagnosis not present

## 2019-08-01 DIAGNOSIS — E612 Magnesium deficiency: Secondary | ICD-10-CM | POA: Diagnosis not present

## 2019-08-01 DIAGNOSIS — M545 Low back pain: Secondary | ICD-10-CM | POA: Diagnosis not present

## 2019-08-01 DIAGNOSIS — E7800 Pure hypercholesterolemia, unspecified: Secondary | ICD-10-CM | POA: Diagnosis not present

## 2019-08-01 DIAGNOSIS — E559 Vitamin D deficiency, unspecified: Secondary | ICD-10-CM | POA: Diagnosis not present

## 2019-08-12 DIAGNOSIS — E119 Type 2 diabetes mellitus without complications: Secondary | ICD-10-CM | POA: Diagnosis not present

## 2019-08-12 DIAGNOSIS — M79671 Pain in right foot: Secondary | ICD-10-CM | POA: Diagnosis not present

## 2019-08-12 DIAGNOSIS — M79672 Pain in left foot: Secondary | ICD-10-CM | POA: Diagnosis not present

## 2019-08-12 DIAGNOSIS — B351 Tinea unguium: Secondary | ICD-10-CM | POA: Diagnosis not present

## 2019-08-12 DIAGNOSIS — L89892 Pressure ulcer of other site, stage 2: Secondary | ICD-10-CM | POA: Diagnosis not present

## 2019-08-12 DIAGNOSIS — E11621 Type 2 diabetes mellitus with foot ulcer: Secondary | ICD-10-CM | POA: Diagnosis not present

## 2019-08-15 ENCOUNTER — Ambulatory Visit: Payer: Medicare Other

## 2019-08-15 DIAGNOSIS — Z1231 Encounter for screening mammogram for malignant neoplasm of breast: Secondary | ICD-10-CM | POA: Diagnosis not present

## 2019-08-19 DIAGNOSIS — H8101 Meniere's disease, right ear: Secondary | ICD-10-CM | POA: Diagnosis not present

## 2019-08-19 DIAGNOSIS — H608X3 Other otitis externa, bilateral: Secondary | ICD-10-CM | POA: Diagnosis not present

## 2019-09-05 ENCOUNTER — Telehealth: Payer: Self-pay | Admitting: Cardiology

## 2019-09-05 NOTE — Telephone Encounter (Signed)
New Message:  Pt says she needs a written prescription for her Eliquis please. She says she have a discount card for Eliquis, but it must have a written prescription.

## 2019-09-08 NOTE — Telephone Encounter (Signed)
Returned call to patient left message on personal voice mail Dr.Jordan will be in office tomorrow.I will call you back when Eliquis prescription is ready.

## 2019-09-09 ENCOUNTER — Other Ambulatory Visit: Payer: Self-pay

## 2019-09-09 MED ORDER — APIXABAN 5 MG PO TABS: 5.0000 mg | ORAL_TABLET | Freq: Two times a day (BID) | ORAL | 3 refills | Status: DC

## 2019-09-09 NOTE — Telephone Encounter (Signed)
Spoke to patient Eliquis written prescription mailed to your home.

## 2019-09-18 DIAGNOSIS — H8101 Meniere's disease, right ear: Secondary | ICD-10-CM | POA: Diagnosis not present

## 2019-10-21 DIAGNOSIS — I4891 Unspecified atrial fibrillation: Secondary | ICD-10-CM | POA: Diagnosis not present

## 2019-10-21 DIAGNOSIS — E559 Vitamin D deficiency, unspecified: Secondary | ICD-10-CM | POA: Diagnosis not present

## 2019-10-21 DIAGNOSIS — K219 Gastro-esophageal reflux disease without esophagitis: Secondary | ICD-10-CM | POA: Diagnosis not present

## 2019-10-21 DIAGNOSIS — E1140 Type 2 diabetes mellitus with diabetic neuropathy, unspecified: Secondary | ICD-10-CM | POA: Diagnosis not present

## 2019-10-21 DIAGNOSIS — E612 Magnesium deficiency: Secondary | ICD-10-CM | POA: Diagnosis not present

## 2019-10-21 DIAGNOSIS — M545 Low back pain: Secondary | ICD-10-CM | POA: Diagnosis not present

## 2019-10-21 DIAGNOSIS — E7800 Pure hypercholesterolemia, unspecified: Secondary | ICD-10-CM | POA: Diagnosis not present

## 2019-10-21 NOTE — Progress Notes (Signed)
Cardiology Office Note   Date:  10/27/2019   ID:  Suzanne Greer, Suzanne Greer 01/01/1951, MRN [id number]  PCP:  Vernie Shanks, MD  Cardiologist:   Jennfier Abdulla Martinique, MD   Chief Complaint  Patient presents with  . Follow-up    3 months.  . Atrial Fibrillation      History of Present Illness: Suzanne Greer is a 69 y.o. female who is seen for follow up Atrial fibrillation/flutter. She has a history of HTN, OSA, and DM.  Has been in Rhythmol and Eliquis for years. She had a remote Echo in 2011 that showed Mild LVH otherwise normal. She reports a Myoview study years ago at [facility] that apparently was normal. She is on CPAP therapy. She reports that in December she was having increased symptoms with Afib and was placed on diltiazem in addition to propafenone and metoprolol. She states she was having episodes at least 2 days a week. When her HR is over 100 she feels palpitations. When HR is up to 150 she feels extremely tired and has chest tightness and pressure like something sitting on her chest. She does have chronic dizziness but attributes this to Meniere's disease. She does not drink caffeine.  Echo in February was normal.  Since she was last seen she is doing much better. She is watching diet and A1c has decreased to 7.2%. lipids are better. She did get some assistance with Eliquis and it is now costing $10/month. She has no Afib problems since last visit.      Past Medical History:  Diagnosis Date  . Atrial fibrillation ([place])   . Back pain   . Diabetes mellitus   . Dysrhythmia    history Atrial Flutter. -Dr, Chiu-cardiology -[facility] follows  . Fibromyalgia   . GERD (gastroesophageal reflux disease)   . Hip pain, bilateral   . Hx of seasonal allergies   . Hyperlipidemia   . Hypertension   . Insomnia   . 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 Obesity   . Restless legs   . Sleep apnea    no cpap use now-unable to tolerate mask,  surgery a yr ago  . Vertigo     Past Surgical History:  Procedure Laterality Date  . APPENDECTOMY    . BUNIONECTOMY    . ESOPHAGOGASTRODUODENOSCOPY (EGD) WITH PROPOFOL N/A 08/16/2015   Procedure: ESOPHAGOGASTRODUODENOSCOPY (EGD) WITH PROPOFOL;  Surgeon: Garlan Fair, MD;  Location: [facility];  Service: Endoscopy;  Laterality: N/A;  . GANGLION CYST   1980   REMOVED   . HEMORROIDECTOMY    . THYROIDECTOMY, PARTIAL  2005   "partial for growth-benign"  . UPPER ENDOSCOPIC ULTRASOUND W/ FNA    . UVULECTOMY     excised about a yr ago [facility]     Current Outpatient Medications  Medication Sig Dispense Refill  . apixaban (ELIQUIS) 5 MG TABS tablet Take 1 tablet (5 mg total) by mouth 2 (two) times daily. 180 tablet 3  . atorvastatin (LIPITOR) 20 MG tablet Take 20 mg by mouth daily.    . cyclobenzaprine (FLEXERIL) 10 MG tablet Take one tablet at bedtime.  Please call [phone] to schedule an appt. 30 tablet 1  . diltiazem (CARDIZEM) 120 MG tablet Take 120 mg by mouth daily.    . fluticasone (FLONASE) 50 MCG/ACT nasal spray Place 2 sprays into both nostrils daily.    . Magnesium 400 MG TABS Take 400  mg by mouth daily.     . Melatonin 10 MG TABS Take 10 mg by mouth at bedtime.     . metFORMIN (GLUCOPHAGE-XR) 500 MG 24 hr tablet Take 500 mg by mouth in the morning, at noon, and at bedtime.     . metoprolol (TOPROL-XL) 50 MG 24 hr tablet Take 50 mg by mouth every morning.     . montelukast (SINGULAIR) 10 MG tablet Take 10 mg by mouth at bedtime.    . propafenone (RYTHMOL) 300 MG tablet Take 300 mg by mouth every 8 (eight) hours.    . traMADol (ULTRAM) 50 MG tablet Take 50 mg by mouth 3 (three) times daily as needed (Pain).     Marland Kitchen VITAMIN D PO Take 1 tablet by mouth daily.     No current facility-administered medications for this visit.    Allergies:   Yeast-related products, Molds & smuts, Penicillins, and Sulfa drugs cross reactors    Social History:  The patient  reports  that she has never smoked. She has never used smokeless tobacco. She reports that she does not drink alcohol or use drugs.   Family History:  The patient's family history includes Cancer (age of onset: 95) in her mother; Diabetes in her father; Drug abuse in her brother; Heart disease in her mother; Hepatitis C in her brother.    ROS:  Please see the history of present illness.   Otherwise, review of systems are positive for none.   All other systems are reviewed and negative.    PHYSICAL EXAM: VS:  BP 122/64 (BP Location: Left Arm, Patient Position: Sitting, Cuff Size: Normal)   Pulse 80   Temp (!) 97.1 F (36.2 C)   Ht 5\' 5"  (1.651 m)   Wt 224 lb (101.6 kg)   BMI 37.28 kg/m  , BMI Body mass index is 37.28 kg/m. GEN: Well nourished, overweight, in no acute distress  HEENT: normal  Neck: no JVD, carotid bruits, or masses Cardiac: RRR; no murmurs, rubs, or gallops,no edema  Respiratory:  clear to auscultation bilaterally, normal work of breathing GI: soft, nontender, nondistended, + BS MS: no deformity or atrophy  Skin: warm and dry, no rash Neuro:  Strength and sensation are intact Psych: euthymic mood, full affect   EKG:  EKG is ordered today. The ekg ordered today demonstrates NSR with rate 69. Normal. I have personally reviewed and interpreted this study.    Recent Labs: No results found for requested labs within last 8760 hours.    Lipid Panel No results found for: CHOL, TRIG, HDL, CHOLHDL, VLDL, LDLCALC, LDLDIRECT   Dated 09/25/18: A1c 7.6%. cholesterol 216, triglycerides 134, HDL 56, LDL 133. CMET and TSH normal. Dated 10/21/19: A1c 7.2%. cholesterol 157, triglycerides 185, HDL 54, LDL 72. Glucose 160 otherwise CMET normal.   Wt Readings from Last 3 Encounters:  10/27/19 224 lb (101.6 kg)  07/21/19 222 lb (100.7 kg)  06/14/18 217 lb (98.4 kg)      Other studies Reviewed: Additional studies/ records that were reviewed today include:   Echo 07/30/19: IMPRESSIONS     1. Left ventricular ejection fraction by 3D volume is 57 %. The left  ventricle has normal function. The left ventrical has no regional wall  motion abnormalities. There is mildly increased concentric left  ventricular hypertrophy. Left ventricular  diastolic parameters are consistent with Grade I diastolic dysfunction  (impaired relaxation). The average left ventricular global longitudinal  strain is -18.5 %.  2. Right ventricular  systolic function is mildly reduced. The right  ventricular size is normal. Tricuspid regurgitation signal is inadequate  for assessing PA pressure.  3. The mitral valve is normal in structure and function. trivial mitral  valve regurgitation. No evidence of mitral stenosis.  4. The aortic valve is normal in structure and function. Aortic valve  regurgitation is not visualized. No aortic stenosis is present.   ASSESSMENT AND PLAN:  1.  Paroxysmal Atrial fibrillation. Patient on long term AAD therapy with propafenone. Noted increased symptoms in December that have improved with the addition of diltiazem.  She has a [place] Vasc score of  3-4 so I would recommend long term anticoagulation for stroke prophylaxis. Currently on Eliquis. Continue current therapy and follow up in 6 months. 2. DM type 2. Improved. Last A1c 7.2%.  3. HLD. Last LDL improved to 72. Continue current lipitor dose.  4. OSA on CPAP. Stressed importance of keeping weight under control.   Current medicines are reviewed at length with the patient today.  The patient does not have concerns regarding medicines.  The following changes have been made:  no change  Labs/ tests ordered today include:   No orders of the defined types were placed in this encounter.    Disposition:   FU with me in 6 months  Signed, Suzanne Mazzuca Martin[place], MD  10/27/2019 10:27 AM    [facility] [address], [place], [place], [address] Phone [phone], Fax [phone]

## 2019-10-27 ENCOUNTER — Encounter: Payer: Self-pay | Admitting: Cardiology

## 2019-10-27 ENCOUNTER — Ambulatory Visit (INDEPENDENT_AMBULATORY_CARE_PROVIDER_SITE_OTHER): Payer: Medicare Other | Admitting: Cardiology

## 2019-10-27 ENCOUNTER — Other Ambulatory Visit: Payer: Self-pay

## 2019-10-27 VITALS — BP 122/64 | HR 80 | Temp 97.1°F | Ht 65.0 in | Wt 224.0 lb

## 2019-10-27 DIAGNOSIS — E7800 Pure hypercholesterolemia, unspecified: Secondary | ICD-10-CM

## 2019-10-27 DIAGNOSIS — I480 Paroxysmal atrial fibrillation: Secondary | ICD-10-CM

## 2019-10-27 DIAGNOSIS — E118 Type 2 diabetes mellitus with unspecified complications: Secondary | ICD-10-CM | POA: Diagnosis not present

## 2019-10-27 DIAGNOSIS — G4733 Obstructive sleep apnea (adult) (pediatric): Secondary | ICD-10-CM | POA: Diagnosis not present

## 2019-10-30 DIAGNOSIS — E1159 Type 2 diabetes mellitus with other circulatory complications: Secondary | ICD-10-CM | POA: Diagnosis not present

## 2019-10-30 DIAGNOSIS — H259 Unspecified age-related cataract: Secondary | ICD-10-CM | POA: Diagnosis not present

## 2019-10-30 DIAGNOSIS — E1140 Type 2 diabetes mellitus with diabetic neuropathy, unspecified: Secondary | ICD-10-CM | POA: Diagnosis not present

## 2019-10-30 DIAGNOSIS — I4891 Unspecified atrial fibrillation: Secondary | ICD-10-CM | POA: Diagnosis not present

## 2019-10-30 DIAGNOSIS — E7800 Pure hypercholesterolemia, unspecified: Secondary | ICD-10-CM | POA: Diagnosis not present

## 2019-10-30 DIAGNOSIS — J45909 Unspecified asthma, uncomplicated: Secondary | ICD-10-CM | POA: Diagnosis not present

## 2019-10-30 DIAGNOSIS — E119 Type 2 diabetes mellitus without complications: Secondary | ICD-10-CM | POA: Diagnosis not present

## 2019-10-30 DIAGNOSIS — E1149 Type 2 diabetes mellitus with other diabetic neurological complication: Secondary | ICD-10-CM | POA: Diagnosis not present

## 2019-11-07 ENCOUNTER — Other Ambulatory Visit: Payer: Self-pay | Admitting: Neurology

## 2019-11-10 ENCOUNTER — Telehealth: Payer: Self-pay | Admitting: Neurology

## 2019-11-10 MED ORDER — CYCLOBENZAPRINE HCL 10 MG PO TABS: ORAL_TABLET | ORAL | 2 refills | Status: DC

## 2019-11-10 NOTE — Telephone Encounter (Signed)
We can fill #30 with 2 additional refills and she needs to f/u   One po qHS

## 2019-11-10 NOTE — Telephone Encounter (Signed)
Patient last seen 06/14/2018. Rx cyclobenzaprine on file prescribed for 10mg  po qhs last 10/08/2018 #30, 1 refill. Pt scheduled f/u for 01/27/20 with AL,NP. I will speak with MD to see if ok to refill

## 2019-11-10 NOTE — Telephone Encounter (Signed)
E-scribed rx. Pt has f/u 01/27/20

## 2019-11-10 NOTE — Telephone Encounter (Signed)
Phone rep checked office voicemail's, at 11:54 pt left message asking for a refill on her cyclobenzaprine (FLEXERIL) 10 MG tablet to [facility] #[id number] .  Pt was called and she agreed to schedule her annual f/u and is on wait list.  Pt was told her request for a refill would be sent to RN and if there were questions or concerns she would be called.

## 2019-11-10 NOTE — Telephone Encounter (Signed)
Dr. Felecia Shelling- what would you like to do?

## 2019-11-10 NOTE — Addendum Note (Signed)
Addended by: Wyvonnia Lora on: 11/10/2019 03:32 PM   Modules accepted: Orders

## 2020-01-14 DIAGNOSIS — M25551 Pain in right hip: Secondary | ICD-10-CM | POA: Diagnosis not present

## 2020-01-14 DIAGNOSIS — M545 Low back pain: Secondary | ICD-10-CM | POA: Diagnosis not present

## 2020-01-23 DIAGNOSIS — E1149 Type 2 diabetes mellitus with other diabetic neurological complication: Secondary | ICD-10-CM | POA: Diagnosis not present

## 2020-01-26 DIAGNOSIS — H259 Unspecified age-related cataract: Secondary | ICD-10-CM | POA: Diagnosis not present

## 2020-01-26 DIAGNOSIS — I4891 Unspecified atrial fibrillation: Secondary | ICD-10-CM | POA: Diagnosis not present

## 2020-01-26 DIAGNOSIS — E119 Type 2 diabetes mellitus without complications: Secondary | ICD-10-CM | POA: Diagnosis not present

## 2020-01-26 DIAGNOSIS — E1140 Type 2 diabetes mellitus with diabetic neuropathy, unspecified: Secondary | ICD-10-CM | POA: Diagnosis not present

## 2020-01-26 DIAGNOSIS — E1159 Type 2 diabetes mellitus with other circulatory complications: Secondary | ICD-10-CM | POA: Diagnosis not present

## 2020-01-26 DIAGNOSIS — J45909 Unspecified asthma, uncomplicated: Secondary | ICD-10-CM | POA: Diagnosis not present

## 2020-01-26 DIAGNOSIS — E1149 Type 2 diabetes mellitus with other diabetic neurological complication: Secondary | ICD-10-CM | POA: Diagnosis not present

## 2020-01-26 DIAGNOSIS — E7800 Pure hypercholesterolemia, unspecified: Secondary | ICD-10-CM | POA: Diagnosis not present

## 2020-01-26 NOTE — Progress Notes (Signed)
PATIENT: Suzanne Greer DOB: Mar 09, 1951  REASON FOR VISIT: follow up HISTORY FROM: patient  Chief Complaint  Patient presents with  . Follow-up    F/U for hip and back pain. Wants to discuss possibly starting PT for her back and neck pain.   . room 6    alone      HISTORY OF PRESENT ILLNESS: Today 01/27/20 Suzanne Greer is a 69 y.o. female here today for follow up for chronic pain. She continues cyclobenzaprine 10mg  as needed. She reports that she has had two "episodes" since May where she was having right sided hip and lateral thigh pain. She was seen by [facility] who felt that pain was related to her back. She had an MRI scheduled for this week but she canceled as she was feeling better. Last MRI in 06/2018 showed "1.  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 2.  At L2-L3, L3-L4, L4-L5 and L5-S1 there is facet hypertrophy +/- ligamenta flava hypertrophy that does not lead to nerve root compression or spinal stenosis". She feels that cyclobenzaprine seems to help most of the time. It did not seem to help as much with most recent episode. She had an ESI in 06/2018 that was effective. She is taking Tramadol 50mg  up to three times daily for back pain prescribed by PCP.   She is followed by [facility] for OSA on CPAP. She reports that she is using CPAP nightly.    HISTORY: (copied from Dr Garth Bigness note on 06/14/2018)  I had the pleasure of seeing your patient, Suzanne Greer, at [facility] for neurologic consultation regarding her back pain, fibromyalgia and sleep issues.  She is a 69 year old woman who has right lower back pain radiating to the right hip.    Pain increases with standing or walking a while and her gait is worse when the pain intensifies.   Sitting sown and using a heating pad helps.    However, if she sits a long time she has  trouble getting up out of the chair.   Pain also increases with lifting.  Pain is worse walking uphill.     There is no pain radiating to the leg.   No weakness or numbness.   She has a long history of stress incontinence since her daughter was born.  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  No UTI x years.    I saw her at [facility] 9-10 years ago initially for OSA and then for LBP.   Flexeril and NSAIDs only helped her a little bit so an ESI was performed and she felt much better for a long time afterwards.   Last lumbar MRI was 2003.    She also has neck pain when she looks up but this is not too troublesome.   She had her last cervical MRI in 2001.   She also has fibromyalgia with myalgias and joint pain.  I had prescribed gabapentin in the past.    It may have helped a bit but she is concerned about side effects.    She has difficulty with sleep many nights.   She sometimes feels flu-like.      We had done a PSG in the pastShe needed higher pressures but could not tolerate BiPAP so she had a UPPP performed (by Dr. Metta Clines  [facility]).   She then was placed on CPAP at lower pressures since a HST showed an AHI = 14 (by her report).  She also has Meniere's disease.   If she gets a bad attack, she takes diazepam (just once a year on average).   She also has AFib and is on Eliquis and Rhythmol.    She has reasonably well controlled Type 2 NIDDM with HgbA1c usually in mid 7's.    REVIEW OF SYSTEMS: Out of a complete 14 system review of symptoms, the patient complains only of the following symptoms, chronic back, hip pain and all other reviewed systems are negative.   ALLERGIES: Allergies  Allergen Reactions  . Yeast-Related Products   . Molds & Smuts Cough  . Penicillins Itching    Has patient had a PCN reaction causing immediate rash, facial/tongue/throat swelling, SOB or lightheadedness with hypotension: No Has patient had a PCN reaction causing severe rash  involving mucus membranes or skin necrosis: No Has patient had a PCN reaction that required hospitalization No Has patient had a PCN reaction occurring within the last 10 years: No If all of the above answers are "NO", then may proceed with Cephalosporin use.  [facility] Cross Reactors Itching    HOME MEDICATIONS: Outpatient Medications Prior to Visit  Medication Sig Dispense Refill  . apixaban (ELIQUIS) 5 MG TABS tablet Take 1 tablet (5 mg total) by mouth 2 (two) times daily. 180 tablet 3  . atorvastatin (LIPITOR) 20 MG tablet Take 20 mg by mouth daily.    . cyclobenzaprine (FLEXERIL) 10 MG tablet Take one tablet at bedtime. Must keep f/u 01/27/20 for future refills 30 tablet 2  . diltiazem (CARDIZEM) 120 MG tablet Take 120 mg by mouth daily.    . fluticasone (FLONASE) 50 MCG/ACT nasal spray Place 2 sprays into both nostrils daily.    . Magnesium 400 MG TABS Take 400 mg by mouth daily.     . Melatonin 10 MG TABS Take 10 mg by mouth at bedtime.     . metFORMIN (GLUCOPHAGE-XR) 500 MG 24 hr tablet Take 1,000 mg by mouth 2 (two) times daily.     . metoprolol (TOPROL-XL) 50 MG 24 hr tablet Take 50 mg by mouth every morning.     . montelukast (SINGULAIR) 10 MG tablet Take 10 mg by mouth at bedtime.    . propafenone (RYTHMOL) 300 MG tablet Take 300 mg by mouth every 8 (eight) hours.    . traMADol (ULTRAM) 50 MG tablet Take 50 mg by mouth 3 (three) times daily as needed (Pain).     Marland Kitchen VITAMIN D PO Take 1 tablet by mouth daily.     No facility-administered medications prior to visit.    PAST MEDICAL HISTORY: Past Medical History:  Diagnosis Date  . Atrial fibrillation ([place])   . Back pain   . Diabetes mellitus   . Dysrhythmia    history Atrial Flutter. -Dr, [facility]  . Fibromyalgia   . GERD (gastroesophageal reflux disease)   . Hip pain, bilateral   . Hx of seasonal allergies   . Hyperlipidemia   . Hypertension   . Insomnia   . 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 Obesity   . Restless legs   . Sleep apnea    no cpap use now-unable to tolerate mask, surgery a yr ago  . Vertigo     PAST SURGICAL HISTORY: Past Surgical  History:  Procedure Laterality Date  . APPENDECTOMY    . BUNIONECTOMY    . ESOPHAGOGASTRODUODENOSCOPY (EGD) WITH PROPOFOL N/A 08/16/2015   Procedure: ESOPHAGOGASTRODUODENOSCOPY (EGD) WITH PROPOFOL;  Surgeon: Garlan Fair, MD;  Location: [facility];  Service: Endoscopy;  Laterality: N/A;  . GANGLION CYST   1980   REMOVED   . HEMORROIDECTOMY    . THYROIDECTOMY, PARTIAL  2005   "partial for growth-benign"  . UPPER ENDOSCOPIC ULTRASOUND W/ FNA    . UVULECTOMY     excised about a yr ago -[place]: Family History  Problem Relation Age of Onset  . Cancer Mother 29       PERITONEAL CANCER  . Heart disease Mother   . Diabetes Father        unsure of complete medical hx - not seen since her 59s  . Hepatitis C Brother   . Drug abuse Brother     SOCIAL HISTORY: Social History   Socioeconomic History  . Marital status: Divorced    Spouse name: Not on file  . Number of children: 1  . Years of education: college  . Highest education level: Bachelor's degree (e.g., BA, AB, BS)  Occupational History  . Occupation: Disability  Tobacco Use  . Smoking status: Never Smoker  . Smokeless tobacco: Never Used  Substance and Sexual Activity  . Alcohol use: No  . Drug use: No  . Sexual activity: Not on file  Other Topics Concern  . Not on file  Social History Narrative   Lives at home alone.   Right-handed.   Occasional use of caffeine.   Social Determinants of Health   Financial Resource Strain:   . Difficulty of Paying Living Expenses:   Food Insecurity:   . Worried About Charity fundraiser in the Last Year:   . Arboriculturist in the Last Year:   Transportation Needs:   . Film/video editor (Medical):   Marland Kitchen Lack of Transportation  (Non-Medical):   Physical Activity:   . Days of Exercise per Week:   . Minutes of Exercise per Session:   Stress:   . Feeling of Stress :   Social Connections:   . Frequency of Communication with Friends and Family:   . Frequency of Social Gatherings with Friends and Family:   . Attends Religious Services:   . Active Member of Clubs or Organizations:   . Attends Archivist Meetings:   Marland Kitchen Marital Status:   Intimate Partner Violence:   . Fear of Current or Ex-Partner:   . Emotionally Abused:   Marland Kitchen Physically Abused:   . Sexually Abused:       PHYSICAL EXAM  Vitals:   01/27/20 0830  BP: (!) 155/84  Greer: 66  Weight: 222 lb 6.4 oz (100.9 kg)  Height: 5\' 5"  (1.651 m)   Body mass index is 37.01 kg/m.  Generalized: Well developed, in no acute distress  Cardiology: normal rate and rhythm, no murmur noted Respiratory: clear to auscultation bilaterally  Neurological examination  Mentation: Alert oriented to time, place, history taking. Follows all commands speech and language fluent Cranial nerve II-XII: Pupils were equal round reactive to light. Extraocular movements were full, visual field were full on confrontational test. Facial sensation and strength were normal. Uvula tongue midline. Head turning and shoulder shrug  were normal and symmetric. Motor: The motor testing reveals 5 over 5 strength of all 4 extremities with exception of 4+/5 of  left hip flexion. Good symmetric motor tone is noted throughout.  Sensory: Sensory testing is intact to soft touch on all 4 extremities with exception of right lateral hip and thigh. No evidence of extinction is noted.  Coordination: Cerebellar testing reveals good finger-nose-finger and heel-to-shin bilaterally.  Gait and station: Gait is normal.   Reflexes: Deep tendon reflexes are symmetric and normal bilaterally.   DIAGNOSTIC DATA (LABS, IMAGING, TESTING) - I reviewed patient records, labs, notes, testing and imaging myself  where available.  No flowsheet data found.   Lab Results  Component Value Date   WBC 16.3 (H) 09/02/2014   HGB 13.2 09/02/2014   HCT 38.9 09/02/2014   MCV 83.5 09/02/2014   PLT 408 (H) 09/02/2014      Component Value Date/Time   NA 135 09/02/2014 1330   K 3.3 (L) 09/02/2014 1330   CL 99 09/02/2014 1330   CO2 24 09/02/2014 1330   GLUCOSE 178 (H) 09/02/2014 1330   BUN 8 09/02/2014 1330   CREATININE 0.74 09/02/2014 1330   CALCIUM 9.0 09/02/2014 1330   PROT 7.9 09/02/2014 1330   ALBUMIN 3.5 09/02/2014 1330   AST 19 09/02/2014 1330   ALT 13 09/02/2014 1330   ALKPHOS 90 09/02/2014 1330   BILITOT 0.5 09/02/2014 1330   GFRNONAA 89 (L) 09/02/2014 1330   GFRAA >90 09/02/2014 1330   No results found for: CHOL, HDL, LDLCALC, LDLDIRECT, TRIG, CHOLHDL No results found for: HGBA1C No results found for: VITAMINB12 Lab Results  Component Value Date   TSH HEMOLYZED SPECIMEN, RESULTS MAY BE AFFECTED 09/02/2014     ASSESSMENT AND PLAN 69 y.o. year old female  has a past medical history of Atrial fibrillation ([place]), Back pain, Diabetes mellitus, Dysrhythmia, Fibromyalgia, GERD (gastroesophageal reflux disease), Hip pain, bilateral, seasonal allergies, Hyperlipidemia, Hypertension, Insomnia, Meniere disease, Obesity, Restless legs, Sleep apnea, and Vertigo. here with     ICD-10-CM   1. Chronic right-sided low back pain, unspecified whether sciatica present  M54.5    G89.29     Suzanne Greer continues to have chronic low back pain. Recently, she has had several episodes effecting the right hip and thigh. She is being seen by [facility] who has recommended MRI and PT. I have encouraged her to continue with plan of care. She will call to reschedule MRI and call [facility] to let them know she wishes to start PT. We will continue cyclobenzaprine as needed as this is somewhat effective. I have discussed with her the option of continuing cyclobenzaprine with PCP as they are also witting her Tramadol. She  was encouraged to work on healthy lifestyle habits. She was educated on stretching exercises that may help. Addition information provided in AVS. She will follow up with [place] annually if we continue cyclobenzaprine. She may follow up as needed for worsening symptoms. She verbalizes understanding and agreement with this plan.    No orders of the defined types were placed in this encounter.    No orders of the defined types were placed in this encounter.     I spent 30 minutes with the patient. 50% of this time was spent counseling and educating patient on plan of care and medications.    Debbora Presto, FNP-C 01/27/2020, 9:39 AM [facility] [address] [place], [place] [address] [phone]

## 2020-01-27 ENCOUNTER — Encounter: Payer: Self-pay | Admitting: Family Medicine

## 2020-01-27 ENCOUNTER — Ambulatory Visit (INDEPENDENT_AMBULATORY_CARE_PROVIDER_SITE_OTHER): Payer: Medicare Other | Admitting: Family Medicine

## 2020-01-27 VITALS — BP 155/84 | HR 65 | Ht 65.0 in | Wt 222.4 lb

## 2020-01-27 DIAGNOSIS — G8929 Other chronic pain: Secondary | ICD-10-CM | POA: Diagnosis not present

## 2020-01-27 DIAGNOSIS — M545 Low back pain: Secondary | ICD-10-CM

## 2020-01-27 NOTE — Progress Notes (Signed)
I have read the note, and I agree with the clinical assessment and plan.  Danel Requena A. Shagun Wordell, MD, PhD, FAAN Certified in Neurology, Clinical Neurophysiology, Sleep Medicine, Pain Medicine and Neuroimaging  [facility] [address], [address] [place], [place] [address] [phone]  

## 2020-01-27 NOTE — Patient Instructions (Addendum)
We will continue cyclobenzaprine 10mg  up to 3 times per day as needed. Please follow up with [facility] as planned. I do recommend rescheduling MRI and considering PT.   Stay well hydrated and focus on healthy lifestyle habits.   Follow up with [place] annually or as needed for worsening  Chronic Back Pain When back pain lasts longer than 3 months, it is called chronic back pain. Pain may get worse at certain times (flare-ups). There are things you can do at home to manage your pain. Follow these instructions at home: Activity      Avoid bending and other activities that make pain worse.  When standing: ? Keep your upper back and neck straight. ? Keep your shoulders pulled back. ? Avoid slouching.  When sitting: ? Keep your back straight. ? Relax your shoulders. Do not round your shoulders or pull them backward.  Do not sit or stand in one place for long periods of time.  Take short rest breaks during the day. Lying down or standing is usually better than sitting. Resting can help relieve pain.  When sitting or lying down for a long time, do some mild activity or stretching. This will help to prevent stiffness and pain.  Get regular exercise. Ask your doctor what activities are safe for you.  Do not lift anything that is heavier than 10 lb (4.5 kg). To prevent injury when you lift things: ? Bend your knees. ? Keep the weight close to your body. ? Avoid twisting. Managing pain  If told, put ice on the painful area. Your doctor may tell you to use ice for 24-48 hours after a flare-up starts. ? Put ice in a plastic bag. ? Place a towel between your skin and the bag. ? Leave the ice on for 20 minutes, 2-3 times a day.  If told, put heat on the painful area as often as told by your doctor. Use the heat source that your doctor recommends, such as a moist heat pack or a heating pad. ? Place a towel between your skin and the heat source. ? Leave the heat on for 20-30  minutes. ? Remove the heat if your skin turns bright red. This is especially important if you are unable to feel pain, heat, or cold. You may have a greater risk of getting burned.  Soak in a warm bath. This can help relieve pain.  Take over-the-counter and prescription medicines only as told by your doctor. General instructions  Sleep on a firm mattress. Try lying on your side with your knees slightly bent. If you lie on your back, put a pillow under your knees.  Keep all follow-up visits as told by your doctor. This is important. Contact a doctor if:  You have pain that does not get better with rest or medicine. Get help right away if:  One or both of your arms or legs feel weak.  One or both of your arms or legs lose feeling (numbness).  You have trouble controlling when you poop (bowel movement) or pee (urinate).  You feel sick to your stomach (nauseous).  You throw up (vomit).  You have belly (abdominal) pain.  You have shortness of breath.  You pass out (faint). Summary  When back pain lasts longer than 3 months, it is called chronic back pain.  Pain may get worse at certain times (flare-ups).  Use ice and heat as told by your doctor. Your doctor may tell you to use ice after flare-ups.  This information is not intended to replace advice given to you by your health care provider. Make sure you discuss any questions you have with your health care provider. Document Revised: 09/26/2018 Document Reviewed: 01/18/2017 Elsevier Patient Education  2020 [employer].

## 2020-01-28 DIAGNOSIS — M5416 Radiculopathy, lumbar region: Secondary | ICD-10-CM | POA: Diagnosis not present

## 2020-01-28 DIAGNOSIS — M545 Low back pain: Secondary | ICD-10-CM | POA: Diagnosis not present

## 2020-02-03 DIAGNOSIS — M545 Low back pain: Secondary | ICD-10-CM | POA: Diagnosis not present

## 2020-02-04 DIAGNOSIS — R197 Diarrhea, unspecified: Secondary | ICD-10-CM | POA: Diagnosis not present

## 2020-02-07 ENCOUNTER — Other Ambulatory Visit: Payer: Self-pay | Admitting: Neurology

## 2020-02-11 ENCOUNTER — Other Ambulatory Visit: Payer: Self-pay | Admitting: Neurology

## 2020-03-03 DIAGNOSIS — E11621 Type 2 diabetes mellitus with foot ulcer: Secondary | ICD-10-CM | POA: Diagnosis not present

## 2020-03-03 DIAGNOSIS — B351 Tinea unguium: Secondary | ICD-10-CM | POA: Diagnosis not present

## 2020-03-03 DIAGNOSIS — L89892 Pressure ulcer of other site, stage 2: Secondary | ICD-10-CM | POA: Diagnosis not present

## 2020-03-03 DIAGNOSIS — E119 Type 2 diabetes mellitus without complications: Secondary | ICD-10-CM | POA: Diagnosis not present

## 2020-03-03 DIAGNOSIS — M79671 Pain in right foot: Secondary | ICD-10-CM | POA: Diagnosis not present

## 2020-03-03 DIAGNOSIS — M79672 Pain in left foot: Secondary | ICD-10-CM | POA: Diagnosis not present

## 2020-03-08 DIAGNOSIS — Z86010 Personal history of colonic polyps: Secondary | ICD-10-CM | POA: Diagnosis not present

## 2020-03-08 DIAGNOSIS — R6881 Early satiety: Secondary | ICD-10-CM | POA: Diagnosis not present

## 2020-03-08 DIAGNOSIS — R195 Other fecal abnormalities: Secondary | ICD-10-CM | POA: Diagnosis not present

## 2020-03-08 DIAGNOSIS — R109 Unspecified abdominal pain: Secondary | ICD-10-CM | POA: Diagnosis not present

## 2020-03-08 DIAGNOSIS — R1319 Other dysphagia: Secondary | ICD-10-CM | POA: Diagnosis not present

## 2020-03-09 ENCOUNTER — Telehealth: Payer: Self-pay

## 2020-03-09 DIAGNOSIS — M545 Low back pain: Secondary | ICD-10-CM | POA: Diagnosis not present

## 2020-03-09 NOTE — Telephone Encounter (Signed)
Patient with diagnosis of afib on Eliquis for anticoagulation.    Procedure: endoscopy Date of procedure: 05/07/20  CHADS2-VASc score of 4 (age, sex, HTN, DM)  CrCl >112mL/min Platelet count 382K  Per office protocol, patient can hold Eliquis for 1-2 days prior to procedure.

## 2020-03-09 NOTE — Telephone Encounter (Signed)
   Primary Cardiologist: No primary care provider on file.  Chart reviewed and patient contacted today as part of pre-operative protocol coverage. Given past medical history and time since last visit, based on ACC/AHA guidelines, [name] would be at acceptable risk for the planned procedure without further cardiovascular testing.   OK to hold Eliquis 1-2 days pre op if needed.  The patient was advised that if she develops new symptoms prior to surgery to contact our office to arrange for a follow-up visit, and she verbalized understanding.  I will route this recommendation to the requesting party via Epic fax function and remove from pre-op pool.  Please call with questions.  Kerin Ransom, PA-C 03/09/2020, 1:37 PM

## 2020-03-09 NOTE — Telephone Encounter (Signed)
Please comment on anticoagulation and then I will contact the patient for clearance.   Kerin Ransom PA-C 03/09/2020 10:33 AM

## 2020-03-09 NOTE — Telephone Encounter (Signed)
   [facility] Pre-operative Risk Assessment    HEARTCARE STAFF: - Please ensure there is not already an duplicate clearance open for this procedure. - Under Visit Info/Reason for Call, type in Other and utilize the format Clearance MM/DD/YY or Clearance TBD. Do not use dashes or single digits. - If request is for dental extraction, please clarify the # of teeth to be extracted.  Request for surgical clearance:  1. What type of surgery is being performed? Endoscopy with Dilation  2. When is this surgery scheduled? 05/07/2020  3. What type of clearance is required (medical clearance vs. Pharmacy clearance to hold med vs. Both)? Pharmacy  4. Are there any medications that need to be held prior to surgery and how long? Eliquis, How many days prior to procedure to hold?  5. Practice name and name of physician performing surgery? [facility],  Dr. Alessandra Bevels  6. What is the office phone number? [phone]   7.   What is the office fax number? [phone]  8.   Anesthesia type (None, local, MAC, general) ? Propofol   Jacqulynn Cadet 03/09/2020, 9:38 AM  _________________________________________________________________   (provider comments below)

## 2020-03-17 DIAGNOSIS — Z23 Encounter for immunization: Secondary | ICD-10-CM | POA: Diagnosis not present

## 2020-04-02 ENCOUNTER — Encounter: Payer: Medicare Other | Attending: Family Medicine | Admitting: Registered"

## 2020-04-02 ENCOUNTER — Other Ambulatory Visit: Payer: Self-pay

## 2020-04-02 ENCOUNTER — Encounter: Payer: Self-pay | Admitting: Registered"

## 2020-04-02 DIAGNOSIS — E118 Type 2 diabetes mellitus with unspecified complications: Secondary | ICD-10-CM | POA: Insufficient documentation

## 2020-04-02 NOTE — Progress Notes (Signed)
Diabetes Self-Management Education  Visit Type: First/Initial  Appt. Start Time: 0800 Appt. End Time: 6606  04/02/2020  [name], identified by name and date of birth, is a 69 y.o. female with a diagnosis of Diabetes: Type 2.   ASSESSMENT  There were no vitals taken for this visit. There is no height or weight on file to calculate BMI.   Patient has retained some of the information she has learned in previous visits to [facility]. Pt states she wants to understand diabetes on a deeper level and asked for a book recommendation.  Pt has had experience with weight watchers and remembers that the egg McMuffin is one of the better choices at [facility]. Pt states she does not get fries when eating fast food. 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  Pt states she likes some of the things she cooks such as soups, but doesn't like how her eggs turn out. Pt states she cooks steel cut oats because it was recommended in the Next 56 days weight loss program.  Pt states she spends a lot of time in bed because of back & hip issues, went to orthopedic MRI dis issue on left side pain on right side. Pt states she requested referral for physical therapy which she states helped prior to COVID. Pt states she is interested in the [employer] exercise project.   Pt states she has been having vomiting and diarrhea issues as is being followed by GI doctor, endoscopy scheduled soon.  Pt states the counselor she was seeing pre-COVID has since retired. Pt states she will go through therapist list and find another even though it is hard starting with someone new. Pt states she has 2 groups of friends that she invites to her house 1-2x week to play cards.  Patient appeared to be having a lot of stress and some confusion around what she should be eating and  guilty feelings when not eating the way that she thinks she should for weight loss and blood sugar control. Patient is motivated to make healthy choices and RD plans to start first by simplifying her meals with a 7-day realistic meal plan and we will start to work on her relationship with food and meal decision making in future visits. Patient agrees with this plan.   Diabetes Self-Management Education - 04/02/20 0813      Visit Information   Visit Type First/Initial      Initial Visit   Diabetes Type Type 2    Are you currently following a meal plan? No    Are you taking your medications as prescribed? Yes   metformin 1000 bid   Date Diagnosed several years ago      Health Coping   How would you rate your overall health? Poor      Psychosocial Assessment   Patient Belief/Attitude about Diabetes Motivated to manage diabetes    How often do you need to have someone help you when you read instructions, pamphlets, or other written materials from your doctor or pharmacy? 1 - Never    What is the last grade level you completed in school? college      Complications   Last HgB A1C per patient/outside source 7.3 %    How often do you check your blood sugar? 3-4 times/day  Fasting Blood glucose range (mg/dL) 180-200;130-179;70-129   120-140 occassionally >180   Have you had a dilated eye exam in the past 12 months? Yes    Have you had a dental exam in the past 12 months? Yes    Are you checking your feet? No      Dietary Intake   Breakfast egg mc muffin OR frappe    Snack (morning) pretzels, apples,    Lunch pasta, sauce, cheese OR fish sandwich [employer]    Snack (afternoon) shake from [employer] frozen dinner roasted [place], mashed potatoes, broccoli carrots    Snack (evening) apple and cheese    Beverage(s) water, almond milk, frappe      Exercise   Exercise Type ADL's    How many days per week to you exercise? 0    How many minutes per day do you exercise? 0    Total  minutes per week of exercise 0      Patient Education   Previous Diabetes Education Yes (please comment)   [facility] 2018,2019   Nutrition management  Meal options for control of blood glucose level and chronic complications.;Meal timing in regards to the patients' current diabetes medication.    Physical activity and exercise  Other (comment)   Interested in exercise project   Medications Reviewed patients medication for diabetes, action, purpose, timing of dose and side effects.      Individualized Goals (developed by patient)   Nutrition Follow meal plan discussed    Physical Activity --   continue with plan to do PT water exercise   Monitoring  test my blood glucose as discussed      Outcomes   Expected Outcomes Demonstrated interest in learning. Expect positive outcomes    Future DMSE 4-6 wks    Program Status Not Completed           Individualized Plan for Diabetes Self-Management Training:   Learning Objective:  Patient will have a greater understanding of diabetes self-management. Patient education plan is to attend individual and/or group sessions per assessed needs and concerns.   Patient Instructions  Use the 7-day Diabetes meal plan which includes snacks based on your blood sugar reading. Watch for a text and or e-mail to participate in the exercise research program. To understand the physiology of diabetes consider looking into [employer].   Expected Outcomes:  Demonstrated interest in learning. Expect positive outcomes  Education material provided: 7-day Diabetes meal plan  If problems or questions, patient to contact team via:  Phone and MyChart  Future DSME appointment: 4-6 wks

## 2020-04-02 NOTE — Patient Instructions (Signed)
Use the 7-day Diabetes meal plan which includes snacks based on your blood sugar reading. Watch for a text and or e-mail to participate in the exercise research program. To understand the physiology of diabetes consider looking into [employer].

## 2020-04-12 DIAGNOSIS — D485 Neoplasm of uncertain behavior of skin: Secondary | ICD-10-CM | POA: Diagnosis not present

## 2020-04-12 DIAGNOSIS — L821 Other seborrheic keratosis: Secondary | ICD-10-CM | POA: Diagnosis not present

## 2020-04-16 DIAGNOSIS — Z1389 Encounter for screening for other disorder: Secondary | ICD-10-CM | POA: Diagnosis not present

## 2020-04-16 DIAGNOSIS — Z0000 Encounter for general adult medical examination without abnormal findings: Secondary | ICD-10-CM | POA: Diagnosis not present

## 2020-04-19 DIAGNOSIS — L080 Pyoderma: Secondary | ICD-10-CM | POA: Diagnosis not present

## 2020-04-23 DIAGNOSIS — I4891 Unspecified atrial fibrillation: Secondary | ICD-10-CM | POA: Diagnosis not present

## 2020-04-23 DIAGNOSIS — Z7984 Long term (current) use of oral hypoglycemic drugs: Secondary | ICD-10-CM | POA: Diagnosis not present

## 2020-04-23 DIAGNOSIS — E119 Type 2 diabetes mellitus without complications: Secondary | ICD-10-CM | POA: Diagnosis not present

## 2020-04-23 DIAGNOSIS — E7800 Pure hypercholesterolemia, unspecified: Secondary | ICD-10-CM | POA: Diagnosis not present

## 2020-04-23 DIAGNOSIS — J45909 Unspecified asthma, uncomplicated: Secondary | ICD-10-CM | POA: Diagnosis not present

## 2020-04-23 DIAGNOSIS — E1159 Type 2 diabetes mellitus with other circulatory complications: Secondary | ICD-10-CM | POA: Diagnosis not present

## 2020-04-23 DIAGNOSIS — E1140 Type 2 diabetes mellitus with diabetic neuropathy, unspecified: Secondary | ICD-10-CM | POA: Diagnosis not present

## 2020-04-23 DIAGNOSIS — E1149 Type 2 diabetes mellitus with other diabetic neurological complication: Secondary | ICD-10-CM | POA: Diagnosis not present

## 2020-04-23 DIAGNOSIS — H259 Unspecified age-related cataract: Secondary | ICD-10-CM | POA: Diagnosis not present

## 2020-04-23 DIAGNOSIS — F331 Major depressive disorder, recurrent, moderate: Secondary | ICD-10-CM | POA: Diagnosis not present

## 2020-04-30 ENCOUNTER — Other Ambulatory Visit: Payer: Self-pay

## 2020-04-30 ENCOUNTER — Encounter: Payer: Medicare Other | Attending: Family Medicine | Admitting: Registered"

## 2020-04-30 ENCOUNTER — Encounter: Payer: Self-pay | Admitting: Registered"

## 2020-04-30 DIAGNOSIS — E1149 Type 2 diabetes mellitus with other diabetic neurological complication: Secondary | ICD-10-CM | POA: Diagnosis not present

## 2020-04-30 DIAGNOSIS — G8929 Other chronic pain: Secondary | ICD-10-CM | POA: Diagnosis not present

## 2020-04-30 DIAGNOSIS — E7800 Pure hypercholesterolemia, unspecified: Secondary | ICD-10-CM | POA: Diagnosis not present

## 2020-04-30 DIAGNOSIS — F331 Major depressive disorder, recurrent, moderate: Secondary | ICD-10-CM | POA: Diagnosis not present

## 2020-04-30 DIAGNOSIS — J45909 Unspecified asthma, uncomplicated: Secondary | ICD-10-CM | POA: Diagnosis not present

## 2020-04-30 DIAGNOSIS — E118 Type 2 diabetes mellitus with unspecified complications: Secondary | ICD-10-CM | POA: Insufficient documentation

## 2020-04-30 DIAGNOSIS — E119 Type 2 diabetes mellitus without complications: Secondary | ICD-10-CM | POA: Diagnosis not present

## 2020-04-30 DIAGNOSIS — H259 Unspecified age-related cataract: Secondary | ICD-10-CM | POA: Diagnosis not present

## 2020-04-30 DIAGNOSIS — G4700 Insomnia, unspecified: Secondary | ICD-10-CM | POA: Diagnosis not present

## 2020-04-30 DIAGNOSIS — K219 Gastro-esophageal reflux disease without esophagitis: Secondary | ICD-10-CM | POA: Diagnosis not present

## 2020-04-30 DIAGNOSIS — I4891 Unspecified atrial fibrillation: Secondary | ICD-10-CM | POA: Diagnosis not present

## 2020-04-30 DIAGNOSIS — E1140 Type 2 diabetes mellitus with diabetic neuropathy, unspecified: Secondary | ICD-10-CM | POA: Diagnosis not present

## 2020-04-30 DIAGNOSIS — E1159 Type 2 diabetes mellitus with other circulatory complications: Secondary | ICD-10-CM | POA: Diagnosis not present

## 2020-04-30 NOTE — Progress Notes (Signed)
Diabetes Self-Management Education  Visit Type: Follow-up  Appt. Start Time: 0930 Appt. End Time: 8250  04/30/2020  [name], identified by name and date of birth, is a 69 y.o. female with a diagnosis of Diabetes: Type 2.   ASSESSMENT  There were no vitals taken for this visit. There is no height or weight on file to calculate BMI.  Pt states she bought the food to follow meal plan from last visit, but misplaced the handout so did not eat the specific structured meals.  Patient has [place] on her phone and RD used the graphs to explain how to use the data to make decisions about how many carbs to include in meals. RD also taught how to use the arrows to help decide if she needs to take action on a specific reading.  Patient wanted more specific information on foods that contain carbohydrates. RD provided the Nov[employer] nordisk book with detailed lists of carbohydrates in foods.  Pt states she has watched some [employer] on diabetes and found them helpful.   Diabetes Self-Management Education - 04/30/20 1022      Visit Information   Visit Type Follow-up      Initial Visit   Diabetes Type Type 2    Are you currently following a meal plan? No   lost meal plan from last visit     Complications   Last HgB A1C per patient/outside source 7 %   patient reported   How often do you check your blood sugar? 3-4 times/day      Exercise   Exercise Type Light (walking / raking leaves)   has started with exercise research     Patient Education   Nutrition management  Carbohydrate counting   order of macronutrients to address impaired 1st phase insulin response   Physical activity and exercise  Other (comment)   evening exercise to address FBS\     Outcomes   Expected Outcomes Demonstrated interest in learning. Expect positive outcomes    Future DMSE 4-6 wks    Program Status Not Completed      Subsequent Visit   Since your last visit have you continued or begun to  take your medications as prescribed? Yes    Since your last visit, are you checking your blood glucose at least once a day? Yes           Individualized Plan for Diabetes Self-Management Training:   Learning Objective:  Patient will have a greater understanding of diabetes self-management. Patient education plan is to attend individual and/or group sessions per assessed needs and concerns.  Patient Instructions  To address blood sugar spikes: Before eating oatmeal eat something with protein and/or fat.  You don't need to worry about low blood sugar. Goals: fasting & pre-mail 80-130 mg/dL  If pre-meal it is high consider having fewer carbs 2 hours after less than 180.  Accept invitation to share & Breck Coons data to the [place] site  Expected Outcomes:  Demonstrated interest in learning. Expect positive outcomes  Education material provided: Meal planning and carb counting (novo nordisk)  If problems or questions, patient to contact team via:  Phone and MyChart  Future DSME appointment: 4-6 wks

## 2020-04-30 NOTE — Patient Instructions (Addendum)
To address blood sugar spikes: Before eating oatmeal eat something with protein and/or fat.  You don't need to worry about low blood sugar. Goals: fasting & pre-mail 80-130 mg/dL  If pre-meal it is high consider having fewer carbs 2 hours after less than 180.  Accept invitation to share & Breck Coons data to the [place] site

## 2020-05-03 DIAGNOSIS — Z1159 Encounter for screening for other viral diseases: Secondary | ICD-10-CM | POA: Diagnosis not present

## 2020-05-07 DIAGNOSIS — K2930 Chronic superficial gastritis without bleeding: Secondary | ICD-10-CM | POA: Diagnosis not present

## 2020-05-07 DIAGNOSIS — K219 Gastro-esophageal reflux disease without esophagitis: Secondary | ICD-10-CM | POA: Diagnosis not present

## 2020-05-07 DIAGNOSIS — R1310 Dysphagia, unspecified: Secondary | ICD-10-CM | POA: Diagnosis not present

## 2020-05-07 DIAGNOSIS — K317 Polyp of stomach and duodenum: Secondary | ICD-10-CM | POA: Diagnosis not present

## 2020-05-07 DIAGNOSIS — K2100 Gastro-esophageal reflux disease with esophagitis, without bleeding: Secondary | ICD-10-CM | POA: Diagnosis not present

## 2020-05-07 DIAGNOSIS — K222 Esophageal obstruction: Secondary | ICD-10-CM | POA: Diagnosis not present

## 2020-05-10 ENCOUNTER — Ambulatory Visit: Payer: Medicare Other | Admitting: Psychologist

## 2020-05-12 DIAGNOSIS — K219 Gastro-esophageal reflux disease without esophagitis: Secondary | ICD-10-CM | POA: Diagnosis not present

## 2020-05-12 DIAGNOSIS — K317 Polyp of stomach and duodenum: Secondary | ICD-10-CM | POA: Diagnosis not present

## 2020-05-12 DIAGNOSIS — K2930 Chronic superficial gastritis without bleeding: Secondary | ICD-10-CM | POA: Diagnosis not present

## 2020-05-17 DIAGNOSIS — K2100 Gastro-esophageal reflux disease with esophagitis, without bleeding: Secondary | ICD-10-CM | POA: Diagnosis not present

## 2020-05-17 DIAGNOSIS — R1319 Other dysphagia: Secondary | ICD-10-CM | POA: Diagnosis not present

## 2020-05-26 ENCOUNTER — Ambulatory Visit (INDEPENDENT_AMBULATORY_CARE_PROVIDER_SITE_OTHER): Payer: Medicare Other | Admitting: Psychologist

## 2020-05-26 DIAGNOSIS — F330 Major depressive disorder, recurrent, mild: Secondary | ICD-10-CM | POA: Diagnosis not present

## 2020-05-28 ENCOUNTER — Encounter: Payer: Medicare Other | Attending: Family Medicine | Admitting: Registered"

## 2020-05-28 ENCOUNTER — Other Ambulatory Visit: Payer: Self-pay

## 2020-05-28 DIAGNOSIS — E118 Type 2 diabetes mellitus with unspecified complications: Secondary | ICD-10-CM | POA: Insufficient documentation

## 2020-05-28 NOTE — Progress Notes (Signed)
Diabetes Self-Management Education  Visit Type: Follow-upfollow-up  Appt. Start Time: 0930 Appt. End Time: 1000  05/28/2020  [name], identified by name and date of birth, is a 69 y.o. female with a diagnosis of Diabetes: Type 2. Type 2  ASSESSMENT  There were no vitals taken for this visit. There is no height or weight on file to calculate BMI.  Patient returns for continued DSME. Pt states she has been working on finding a balanced breakfast that keeps her satisfied and not needing snacks before lunch which includes cottage cheese with fruit. Pt states she also is eating oatmeal.  Patient showed phone app for [place]. Blood sugar is elevated often with last couple of FBS 176, 188. Pt states although she sleeps ~7 hrs it is not restful sleep and believes she needs a new mattress. Pt reports using CPAP.  Diabetes Self-Management Education - 05/28/20 1200      Visit Information   Visit Type Follow-up      Initial Visit   Diabetes Type Type 2      Exercise   Exercise Type ADL's      Patient Education   Physical activity and exercise  Helped patient identify appropriate exercises in relation to his/her diabetes, diabetes complications and other health issue.    Monitoring Other (comment)   CGM lag time     Individualized Goals (developed by patient)   Physical Activity Exercise 3-5 times per week    Monitoring  Other (comment)   test before exercise and 30 min after exercise     Outcomes   Expected Outcomes Demonstrated interest in learning. Expect positive outcomes    Future DMSE 4-6 wks    Program Status Not Completed      Subsequent Visit   Since your last visit have you continued or begun to take your medications as prescribed? Yes    Since your last visit, are you checking your blood glucose at least once a day? Yes           Exercise: none. Pt states she started the exercise study but stopped due to stress created by the structure and the 400-500  step discrepancy of step count between the pedometer & I-touch air.  Pt states she decided to have access therapy to help with her stress and is interested in the Type 2 support group.  Individualized Plan for Diabetes Self-Management Training:   Learning Objective:  Patient will have a greater understanding of diabetes self-management. Patient education plan is to attend individual and/or group sessions per assessed needs and concerns.  Patient Instructions  Wants to walk daily, but starting 3 times per week walking around the block.  Check blood sugar before exercise and about 30 min after exercise.  Look for your pedometer and itouch air (to track steps)  Expected Outcomes:  Demonstrated interest in learning. Expect positive outcomes  Education material provided: none  If problems or questions, patient to contact team via:  Phone and MyChart  Future DSME appointment: 4-6 wks4-6 weeks

## 2020-05-28 NOTE — Patient Instructions (Signed)
Wants to walk daily, but starting 3 times per week walking around the block.  Check blood sugar before exercise and about 30 min after exercise.  Look for your pedometer and itouch air (to track steps)

## 2020-06-02 DIAGNOSIS — M79671 Pain in right foot: Secondary | ICD-10-CM | POA: Diagnosis not present

## 2020-06-02 DIAGNOSIS — B351 Tinea unguium: Secondary | ICD-10-CM | POA: Diagnosis not present

## 2020-06-02 DIAGNOSIS — E11621 Type 2 diabetes mellitus with foot ulcer: Secondary | ICD-10-CM | POA: Diagnosis not present

## 2020-06-02 DIAGNOSIS — M79672 Pain in left foot: Secondary | ICD-10-CM | POA: Diagnosis not present

## 2020-06-02 DIAGNOSIS — E119 Type 2 diabetes mellitus without complications: Secondary | ICD-10-CM | POA: Diagnosis not present

## 2020-06-02 DIAGNOSIS — L89892 Pressure ulcer of other site, stage 2: Secondary | ICD-10-CM | POA: Diagnosis not present

## 2020-06-15 ENCOUNTER — Other Ambulatory Visit: Payer: Self-pay | Admitting: Cardiology

## 2020-06-15 MED ORDER — PROPAFENONE HCL 300 MG PO TABS
300.0000 mg | ORAL_TABLET | Freq: Three times a day (TID) | ORAL | 3 refills | Status: DC
Start: 2020-06-15 — End: 2020-09-09

## 2020-06-15 NOTE — Telephone Encounter (Signed)
*  STAT* If patient is at the pharmacy, call can be transferred to refill team.   1. Which medications need to be refilled? (please list name of each medication and dose if known)  propafenone (RYTHMOL) 300 MG tablet  2. Which pharmacy/location (including street and city if local pharmacy) is medication to be sent to? [id number] - [place], [place] - [address]  3. Do they need a 30 day or 90 day supply? 90 with refills  Patient is out of medication

## 2020-06-16 DIAGNOSIS — Z20828 Contact with and (suspected) exposure to other viral communicable diseases: Secondary | ICD-10-CM | POA: Diagnosis not present

## 2020-06-16 DIAGNOSIS — J019 Acute sinusitis, unspecified: Secondary | ICD-10-CM | POA: Diagnosis not present

## 2020-06-21 DIAGNOSIS — E1159 Type 2 diabetes mellitus with other circulatory complications: Secondary | ICD-10-CM | POA: Diagnosis not present

## 2020-06-21 DIAGNOSIS — G8929 Other chronic pain: Secondary | ICD-10-CM | POA: Diagnosis not present

## 2020-06-21 DIAGNOSIS — E1140 Type 2 diabetes mellitus with diabetic neuropathy, unspecified: Secondary | ICD-10-CM | POA: Diagnosis not present

## 2020-06-21 DIAGNOSIS — F331 Major depressive disorder, recurrent, moderate: Secondary | ICD-10-CM | POA: Diagnosis not present

## 2020-06-21 DIAGNOSIS — J45909 Unspecified asthma, uncomplicated: Secondary | ICD-10-CM | POA: Diagnosis not present

## 2020-06-21 DIAGNOSIS — I4891 Unspecified atrial fibrillation: Secondary | ICD-10-CM | POA: Diagnosis not present

## 2020-06-21 DIAGNOSIS — H259 Unspecified age-related cataract: Secondary | ICD-10-CM | POA: Diagnosis not present

## 2020-06-21 DIAGNOSIS — Z7984 Long term (current) use of oral hypoglycemic drugs: Secondary | ICD-10-CM | POA: Diagnosis not present

## 2020-06-21 DIAGNOSIS — G4700 Insomnia, unspecified: Secondary | ICD-10-CM | POA: Diagnosis not present

## 2020-06-21 DIAGNOSIS — E7800 Pure hypercholesterolemia, unspecified: Secondary | ICD-10-CM | POA: Diagnosis not present

## 2020-06-21 DIAGNOSIS — K219 Gastro-esophageal reflux disease without esophagitis: Secondary | ICD-10-CM | POA: Diagnosis not present

## 2020-06-21 DIAGNOSIS — R030 Elevated blood-pressure reading, without diagnosis of hypertension: Secondary | ICD-10-CM | POA: Diagnosis not present

## 2020-06-22 NOTE — Progress Notes (Signed)
Cardiology Office Note   Date:  06/25/2020   ID:  Suzanne, Greer 10-02-1950, MRN [id number]  PCP:  Vernie Shanks, MD  Cardiologist:   Kaylean Tupou [place], MD   Chief Complaint  Patient presents with  . Atrial Fibrillation      History of Present Illness: Suzanne Greer is a 70 y.o. female who is seen for follow up Atrial fibrillation/flutter. She has a history of HTN, OSA, and DM.  Has been in Rhythmol and Eliquis for years. She had a remote Echo in 2011 that showed Mild LVH otherwise normal. She reports a Myoview study years ago at [place] [facility] that apparently was normal. She is on CPAP therapy. She reports that in December she was having increased symptoms with Afib and was placed on diltiazem in addition to propafenone and metoprolol. She states she was having episodes at least 2 days a week. When her HR is over 100 she feels palpitations. When HR is up to 150 she feels extremely tired and has chest tightness and pressure like something sitting on her chest. She does have chronic dizziness but attributes this to Meniere's disease. She does not drink caffeine.  Echo in February was normal.  She reports she is doing very well. She did run out of her propafenone for a day and a half and had Afib with HR up to 170-180. As long as she is on the medication her rhythm is very well controlled. She did get some assistance with Eliquis and it is now costing $10/month. We also noted an increase in LDL- this correlates with a period of time when she had a lot of GERD and wasn't able to hold down the atorvastatin. This is better now and she is back on medication.    Past Medical History:  Diagnosis Date  . Atrial fibrillation ([place])   . Back pain   . Diabetes mellitus   . Dysrhythmia    history Atrial Flutter. -Dr, Chiu-cardiology -[place] follows  . Fibromyalgia   . GERD (gastroesophageal reflux disease)   . Hip pain, bilateral   . Hx of seasonal allergies   . Hyperlipidemia   .  Hypertension   . Insomnia   . 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 Obesity   . Restless legs   . Sleep apnea    no cpap use now-unable to tolerate mask, surgery a yr ago  . Vertigo     Past Surgical History:  Procedure Laterality Date  . APPENDECTOMY    . BUNIONECTOMY    . ESOPHAGOGASTRODUODENOSCOPY (EGD) WITH PROPOFOL N/A 08/16/2015   Procedure: ESOPHAGOGASTRODUODENOSCOPY (EGD) WITH PROPOFOL;  Surgeon: Garlan Fair, MD;  Location: [facility] ENDOSCOPY;  Service: Endoscopy;  Laterality: N/A;  . GANGLION CYST   1980   REMOVED   . HEMORROIDECTOMY    . THYROIDECTOMY, PARTIAL  2005   "partial for growth-benign"  . UPPER ENDOSCOPIC ULTRASOUND W/ FNA    . UVULECTOMY     excised about a yr ago [facility]     Current Outpatient Medications  Medication Sig Dispense Refill  . acetaminophen (TYLENOL 8 HOUR) 650 MG CR tablet Take 1 tablet (650 mg total) by mouth every 8 (eight) hours as needed for pain. 15 tablet 0  . apixaban (ELIQUIS) 5 MG TABS tablet Take 1 tablet (5 mg total) by mouth 2 (two) times daily. 180 tablet 3  . atorvastatin (LIPITOR) 20 MG tablet Take 20  mg by mouth daily.    . cyclobenzaprine (FLEXERIL) 10 MG tablet Take one tablet at bedtime. 30 tablet 5  . diltiazem (CARDIZEM) 120 MG tablet Take 120 mg by mouth daily.    . fluticasone (FLONASE) 50 MCG/ACT nasal spray Place 2 sprays into both nostrils daily.    . Magnesium 400 MG TABS Take 400 mg by mouth daily.     . Melatonin 10 MG TABS Take 10 mg by mouth at bedtime.     . metFORMIN (GLUCOPHAGE-XR) 500 MG 24 hr tablet Take 1,000 mg by mouth 2 (two) times daily.     . methocarbamol (ROBAXIN) 500 MG tablet Take 1 tablet (500 mg total) by mouth 2 (two) times daily. 20 tablet 0  . methylPREDNISolone (MEDROL DOSEPAK) 4 MG TBPK tablet Medrol (Pak) 4 mg tablets in a dose pack  Take 1 dose pk by oral route.    . metoprolol (TOPROL-XL) 50 MG 24 hr tablet Take 50 mg by mouth every  morning.    . montelukast (SINGULAIR) 10 MG tablet Take 10 mg by mouth at bedtime.    . propafenone (RYTHMOL) 300 MG tablet Take 1 tablet (300 mg total) by mouth every 8 (eight) hours. 270 tablet 3  . traMADol (ULTRAM) 50 MG tablet Take 50 mg by mouth 3 (three) times daily as needed (Pain).     Marland Kitchen VITAMIN D PO Take 1 tablet by mouth daily.     No current facility-administered medications for this visit.    Allergies:   Yeast-related products, Molds & smuts, Penicillins, and Sulfa drugs cross reactors    Social History:  The patient  reports that she has never smoked. She has never used smokeless tobacco. She reports that she does not drink alcohol and does not use drugs.   Family History:  The patient's family history includes Cancer (age of onset: 33) in her mother; Diabetes in her father; Drug abuse in her brother; Heart disease in her mother; Hepatitis C in her brother.    ROS:  Please see the history of present illness.   Otherwise, review of systems are positive for none.   All other systems are reviewed and negative.    PHYSICAL EXAM: VS:  BP 121/64   Pulse 78   Ht 5\' 5"  (1.651 m)   Wt 223 lb (101.2 kg)   SpO2 98%   BMI 37.11 kg/m  , BMI Body mass index is 37.11 kg/m. GEN: Well nourished, overweight, in no acute distress  HEENT: normal  Neck: no JVD, carotid bruits, or masses Cardiac: RRR; no murmurs, rubs, or gallops,no edema  Respiratory:  clear to auscultation bilaterally, normal work of breathing GI: soft, nontender, nondistended, + BS MS: no deformity or atrophy  Skin: warm and dry, no rash Neuro:  Strength and sensation are intact Psych: euthymic mood, full affect   EKG:  EKG is ordered today. The ekg ordered today demonstrates NSR with rate 78. Nonspecific TWA. I have personally reviewed and interpreted this study.    Recent Labs: No results found for requested labs within last 8760 hours.    Lipid Panel No results found for: CHOL, TRIG, HDL, CHOLHDL,  VLDL, LDLCALC, LDLDIRECT   Dated 09/25/18: A1c 7.6%. cholesterol 216, triglycerides 134, HDL 56, LDL 133. CMET and TSH normal. Dated 10/21/19: A1c 7.2%. cholesterol 157, triglycerides 185, HDL 54, LDL 72. Glucose 160 otherwise CMET normal.  Dated 04/23/20: A1c 7%. Cholesterol 200, triglycerides 161, HDL 55, LDL 117. Glucose 154 otherwise CMET and  TSH normal.   Wt Readings from Last 3 Encounters:  06/25/20 223 lb (101.2 kg)  01/27/20 222 lb 6.4 oz (100.9 kg)  10/27/19 224 lb (101.6 kg)      Other studies Reviewed: Additional studies/ records that were reviewed today include:   Echo 07/30/19: IMPRESSIONS    1. Left ventricular ejection fraction by 3D volume is 57 %. The left  ventricle has normal function. The left ventrical has no regional wall  motion abnormalities. There is mildly increased concentric left  ventricular hypertrophy. Left ventricular  diastolic parameters are consistent with Grade I diastolic dysfunction  (impaired relaxation). The average left ventricular global longitudinal  strain is -18.5 %.  2. Right ventricular systolic function is mildly reduced. The right  ventricular size is normal. Tricuspid regurgitation signal is inadequate  for assessing PA pressure.  3. The mitral valve is normal in structure and function. trivial mitral  valve regurgitation. No evidence of mitral stenosis.  4. The aortic valve is normal in structure and function. Aortic valve  regurgitation is not visualized. No aortic stenosis is present.   ASSESSMENT AND PLAN:  1.  Paroxysmal Atrial fibrillation. Patient on long term AAD therapy with propafenone. Excellent control when on medication. Propafenone refilled.   She has a [place] Vasc score of  3-4 so is on Eliquis. Continue current therapy and follow up in 6 months. 2. DM type 2. Improved. Last A1c 7.0%.  3. HLD. Last LDL increased to 112. Now back on  lipitor dose.  4. OSA on CPAP. Stressed importance of keeping weight under  control.   Current medicines are reviewed at length with the patient today.  The patient does not have concerns regarding medicines.  The following changes have been made:  no change  Labs/ tests ordered today include:   No orders of the defined types were placed in this encounter.    Disposition:   FU with me in 6 months  Signed, Thereasa Iannello [place], MD  06/25/2020 9:59 AM    [facility] [address] [place], [place], [address] Phone [phone], Fax [phone]

## 2020-06-23 ENCOUNTER — Emergency Department (HOSPITAL_COMMUNITY)
Admission: EM | Admit: 2020-06-23 | Discharge: 2020-06-23 | Disposition: A | Discharge status: Home / Self Care | Payer: Medicare Other | Attending: Emergency Medicine | Admitting: Emergency Medicine

## 2020-06-23 ENCOUNTER — Telehealth: Payer: Self-pay | Admitting: *Deleted

## 2020-06-23 ENCOUNTER — Encounter (HOSPITAL_COMMUNITY): Payer: Self-pay

## 2020-06-23 DIAGNOSIS — Z79899 Other long term (current) drug therapy: Secondary | ICD-10-CM | POA: Diagnosis not present

## 2020-06-23 DIAGNOSIS — R52 Pain, unspecified: Secondary | ICD-10-CM | POA: Diagnosis not present

## 2020-06-23 DIAGNOSIS — Z7984 Long term (current) use of oral hypoglycemic drugs: Secondary | ICD-10-CM | POA: Insufficient documentation

## 2020-06-23 DIAGNOSIS — G8929 Other chronic pain: Secondary | ICD-10-CM | POA: Diagnosis not present

## 2020-06-23 DIAGNOSIS — Z7901 Long term (current) use of anticoagulants: Secondary | ICD-10-CM | POA: Diagnosis not present

## 2020-06-23 DIAGNOSIS — E119 Type 2 diabetes mellitus without complications: Secondary | ICD-10-CM | POA: Insufficient documentation

## 2020-06-23 DIAGNOSIS — M549 Dorsalgia, unspecified: Secondary | ICD-10-CM | POA: Diagnosis not present

## 2020-06-23 DIAGNOSIS — M5441 Lumbago with sciatica, right side: Secondary | ICD-10-CM | POA: Insufficient documentation

## 2020-06-23 DIAGNOSIS — I10 Essential (primary) hypertension: Secondary | ICD-10-CM | POA: Diagnosis not present

## 2020-06-23 MED ORDER — METHOCARBAMOL 500 MG PO TABS
500.0000 mg | ORAL_TABLET | Freq: Once | ORAL | Status: AC
  Administered 2020-06-23: 500 mg via ORAL
  Filled 2020-06-23: qty 1

## 2020-06-23 MED ORDER — ACETAMINOPHEN ER 650 MG PO TBCR: 650.0000 mg | EXTENDED_RELEASE_TABLET | Freq: Three times a day (TID) | ORAL | 0 refills | Status: DC | PRN

## 2020-06-23 MED ORDER — OXYCODONE-ACETAMINOPHEN 5-325 MG PO TABS
1.0000 | ORAL_TABLET | Freq: Once | ORAL | Status: AC
  Administered 2020-06-23: 1 via ORAL
  Filled 2020-06-23: qty 1

## 2020-06-23 MED ORDER — METHOCARBAMOL 500 MG PO TABS: 500.0000 mg | ORAL_TABLET | Freq: Two times a day (BID) | ORAL | 0 refills | Status: DC

## 2020-06-23 MED ORDER — LIDOCAINE 5 % EX PTCH
1.0000 | MEDICATED_PATCH | CUTANEOUS | Status: DC
  Administered 2020-06-23: 1 via TRANSDERMAL
  Filled 2020-06-23: qty 1

## 2020-06-23 NOTE — Discharge Instructions (Addendum)
Continue taking your tramadol as needed for your back pain. I am sending you home with tylenol and a muscle relaxer. Muscle relaxer can cause drowsiness, so do not drive or operate machinery while on the medication. I have included the number of the orthopedic surgeon. Call to schedule an appointment if symptoms do not improve over the next week. Return to the ER for new or worsening symptoms.  You may also purchase over-the-counter Lidoderm patches and Voltaren gel for added pain relief.

## 2020-06-23 NOTE — Telephone Encounter (Signed)
Dr. Lucia Gaskins received on call message: "She cannot walk and has numbness in her feet". Pt last saw AL,NP 01/27/20 and RS,MD 06/14/18.

## 2020-06-23 NOTE — Telephone Encounter (Signed)
I called pt and she is in ED awaiting to be seen.  I told her that was the best place due to her not being able to walk.  She appreciated call.

## 2020-06-23 NOTE — ED Notes (Signed)
Pt d/c by MD and is provided w/ d/c instructions and follow up care, Pt is ambulatory out of the ED

## 2020-06-23 NOTE — ED Triage Notes (Signed)
Pt from home with PTAR for chronic lower back pain. Taking tramadol at home without relief. Pt a.o

## 2020-06-23 NOTE — ED Provider Notes (Signed)
[facility] EMERGENCY DEPARTMENT Provider Note   CSN: [id number] Arrival date & time: 06/23/20  1016     History Chief Complaint  Patient presents with  . Back Pain    ELLEY KLEVER is a 70 y.o. female with a past medical history significant for A. fib on chronic Eliquis, chronic back pain, diabetes, fibromyalgia, GERD, hyperlipidemia, hypertension who presents to the ED due to acute on chronic low back pain.  Patient currently takes tramadol 3 times a day for her chronic pain and fibromyalgia.  Patient states last night she heard a "pop" when she was turning in bed and had excruciating low back pain since.  She has a history of previous herniated dics per the patient. No previous surgery. She has not taken anything for pain today. Last took tramadol last night. Pain is worse with movement, especially ambulation. Admits to radiation of pain down posterior aspect of right leg.  She states she had a steroid injection 1 year ago with improvement in symptoms.  Patient denies saddle paresthesias, bowel/bladder incontinence, lower extremity weakness, lower extremity numbness/tingling, IV drug use, fever/chills, trauma, and history of cancer.  Denies associated abdominal pain, urinary symptoms, vaginal symptoms.  Denies chest pain and shortness of breath  History obtained from patient and past medical records. No interpreter used during encounter.      Past Medical History:  Diagnosis Date  . Atrial fibrillation (HCC)   . Back pain   . Diabetes mellitus   . Dysrhythmia    history Atrial Flutter. -Dr, Ch[facility]-cardiology [place] follows  . Fibromyalgia   . GERD (gastroesophageal reflux disease)   . Hip pain, bilateral   . Hx of seasonal allergies   . Hyperlipidemia   . Hypertension   . Insomnia   . 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 Obesity   . Restless legs   . Sleep apnea    no cpap use now-unable to tolerate mask,  surgery a yr ago  . Vertigo     Patient Active Problem List   Diagnosis Date Noted  . OSA (obstructive sleep apnea) 06/14/2018  . Atrial fibrillation (HCC)   . Obesity   . Type 2 diabetes mellitus with complication (HCC)   . Fibromyalgia   . Hypertension   . GERD (gastroesophageal reflux disease)   . Insomnia   . Meniere disease   . Vertigo     Past Surgical History:  Procedure Laterality Date  . APPENDECTOMY    . BUNIONECTOMY    . ESOPHAGOGASTRODUODENOSCOPY (EGD) WITH PROPOFOL N/A 08/16/2015   Procedure: ESOPHAGOGASTRODUODENOSCOPY (EGD) WITH PROPOFOL;  Surgeon: Charolett Bumpers, MD;  Location: [facility] ENDOSCOPY;  Service: Endoscopy;  Laterality: N/A;  . GANGLION CYST   1980   REMOVED   . HEMORROIDECTOMY    . THYROIDECTOMY, PARTIAL  2005   "partial for growth-benign"  . UPPER ENDOSCOPIC ULTRASOUND W/ FNA    . UVULECTOMY     excised about a yr ago [facility]     OB History   No obstetric history on file.     Family History  Problem Relation Age of Onset  . Cancer Mother 18       PERITONEAL CANCER  . Heart disease Mother   . Diabetes Father        unsure of complete medical hx - not seen since her 45s  . Hepatitis C Brother   . Drug abuse Brother  Social History   Tobacco Use  . Smoking status: Never Smoker  . Smokeless tobacco: Never Used  Substance Use Topics  . Alcohol use: No  . Drug use: No    Home Medications Prior to Admission medications   Medication Sig Start Date End Date Taking? Authorizing Provider  acetaminophen (TYLENOL 8 HOUR) 650 MG CR tablet Take 1 tablet (650 mg total) by mouth every 8 (eight) hours as needed for pain. 06/23/20  Yes Ramona Ruark, Druscilla Brownie, PA-C  methocarbamol (ROBAXIN) 500 MG tablet Take 1 tablet (500 mg total) by mouth 2 (two) times daily. 06/23/20  Yes Malorie Bigford, Druscilla Brownie, PA-C  apixaban (ELIQUIS) 5 MG TABS tablet Take 1 tablet (5 mg total) by mouth 2 (two) times daily. 09/09/19   [place], Peter M, MD  atorvastatin  (LIPITOR) 20 MG tablet Take 20 mg by mouth daily. 08/03/15   [provider]  cyclobenzaprine (FLEXERIL) 10 MG tablet Take one tablet at bedtime. 02/11/20   Sater, Nanine Means, MD  diltiazem (CARDIZEM) 120 MG tablet Take 120 mg by mouth daily.    [provider]  fluticasone (FLONASE) 50 MCG/ACT nasal spray Place 2 sprays into both nostrils daily.    [provider]  Magnesium 400 MG TABS Take 400 mg by mouth daily.     [provider]  Melatonin 10 MG TABS Take 10 mg by mouth at bedtime.     [provider]  metFORMIN (GLUCOPHAGE-XR) 500 MG 24 hr tablet Take 1,000 mg by mouth 2 (two) times daily.     [provider]  metoprolol (TOPROL-XL) 50 MG 24 hr tablet Take 50 mg by mouth every morning.     [provider]  montelukast (SINGULAIR) 10 MG tablet Take 10 mg by mouth at bedtime.    [provider]  propafenone (RYTHMOL) 300 MG tablet Take 1 tablet (300 mg total) by mouth every 8 (eight) hours. 06/15/20   [place], Peter M, MD  traMADol (ULTRAM) 50 MG tablet Take 50 mg by mouth 3 (three) times daily as needed (Pain).     [provider]  VITAMIN D PO Take 1 tablet by mouth daily.    [provider]    Allergies    Yeast-related products, Molds & smuts, Penicillins, and Sulfa drugs cross reactors  Review of Systems   Review of Systems  Constitutional: Negative for chills and fever.  Respiratory: Negative for shortness of breath.   Cardiovascular: Negative for chest pain.  Gastrointestinal: Negative for abdominal pain, diarrhea, nausea and vomiting.  Genitourinary: Negative for dysuria and vaginal discharge.  Musculoskeletal: Positive for back pain and gait problem.  Neurological: Negative for numbness.  All other systems reviewed and are negative.   Physical Exam Updated Vital Signs BP (!) 137/45 (BP Location: Right Arm)   Pulse 90   Temp 98.4 F (36.9 C) (Oral)   Resp 17   SpO2 96%   Physical  Exam Vitals and nursing note reviewed.  Constitutional:      General: She is not in acute distress.    Appearance: She is not ill-appearing.  HENT:     Head: Normocephalic.  Eyes:     Pupils: Pupils are equal, round, and reactive to light.  Neck:     Comments: No cervical midline tenderness. Cardiovascular:     Rate and Rhythm: Normal rate and regular rhythm.     Pulses: Normal pulses.     Heart sounds: Normal heart sounds. No murmur heard. No friction  rub. No gallop.   Pulmonary:     Effort: Pulmonary effort is normal.     Breath sounds: Normal breath sounds.  Abdominal:     General: Abdomen is flat. There is no distension.     Palpations: Abdomen is soft.     Tenderness: There is no abdominal tenderness. There is no guarding or rebound.  Musculoskeletal:     Cervical back: Neck supple.     Comments: No thoracic midline tenderness. Mild lumbar midline tenderness, but most significant in bilateral lumbar paraspinal region. 5/5 strength bilateral lower extremities. Lower extremities neurovascularly intact bilaterally.  Skin:    General: Skin is warm and dry.     Findings: No rash.  Neurological:     General: No focal deficit present.     Mental Status: She is alert.  Psychiatric:        Mood and Affect: Mood normal.        Behavior: Behavior normal.     ED Results / Procedures / Treatments   Labs (all labs ordered are listed, but only abnormal results are displayed) Labs Reviewed - No data to display  EKG None  Radiology No results found.  Procedures Procedures (including critical care time)  Medications Ordered in ED Medications  lidocaine (LIDODERM) 5 % 1 patch (1 patch Transdermal Patch Applied 06/23/20 1535)  oxyCODONE-acetaminophen (PERCOCET/ROXICET) 5-325 MG per tablet 1 tablet (1 tablet Oral Given 06/23/20 1535)  methocarbamol (ROBAXIN) tablet 500 mg (500 mg Oral Given 06/23/20 1539)    ED Course  I have reviewed the triage vital signs and the nursing  notes.  Pertinent labs & imaging results that were available during my care of the patient were reviewed by me and considered in my medical decision making (see chart for details).    MDM Rules/Calculators/A&P                         70 year old female presents to the ED due to acute on chronic low back pain.  History of chronic low back pain on tramadol 3 times daily.  Patient denies saddle paresthesias, bowel/bladder habits, lower extremity weakness, lower extremity numbness/tingling, IV drug use, fever/chills, trauma, and history of cancer.  Upon arrival, stable vitals.  Patient in no acute distress and non-ill-appearing.  Physical exam significant for mild lumbar midline tenderness most significant in bilateral lumbar paraspinal tenderness.  No cervical or thoracic midline tenderness.  Lower extremities neurovascularly intact. Given radiation of pain down right leg, suspect radicular pain. Low suspicion for cauda equina or central cord compression.  Percocet, robaxin, and lidoderm patch given here in the ED with symptomatic relief. MRI performed 1 year ago which demonstrates: IMPRESSION: This MRI of the lumbar spine without contrast shows the following: 1.  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 2.  At L2-L3, L3-L4, L4-L5 and L5-S1 there is facet hypertrophy +/- ligamenta flava hypertrophy that does not lead to nerve root compression or spinal stenosis.  4:01 PM reassessed patient at bedside who notes improvement in symptoms after pain medication.  Patient able to ambulate in the ED without difficulty.  Patient states she has a walker at home.  Patient discharged with Tylenol and Robaxin. Steroids held due to history of diabetes.Patient agreeable to plan. Orthopedics number given to patient at discharge. Instructed patient to call tomorrow to schedule an appointment for further evaluation.  Strict  ED precautions discussed with patient. Patient states understanding and agrees to plan. Patient discharged home in no acute distress and stable vitals.  Discussed case with Dr. Adela Lank who agrees with assessment and plan.  Final Clinical Impression(s) / ED Diagnoses Final diagnoses:  Chronic bilateral low back pain with right-sided sciatica    Rx / DC Orders ED Discharge Orders         Ordered    acetaminophen (TYLENOL 8 HOUR) 650 MG CR tablet  Every 8 hours PRN        06/23/20 1549    methocarbamol (ROBAXIN) 500 MG tablet  2 times daily        06/23/20 1549           Mannie Stabile, PA-C 06/23/20 1613    Melene Plan, DO 06/23/20 1800

## 2020-06-23 NOTE — Telephone Encounter (Signed)
I called pt and LMVM for her that calling back due to on call message received about her not being able to walk and feet numb.  I LMVM for her that she may need to go to ED for evaluation, but to call back.

## 2020-06-24 DIAGNOSIS — M5126 Other intervertebral disc displacement, lumbar region: Secondary | ICD-10-CM | POA: Diagnosis not present

## 2020-06-24 DIAGNOSIS — M5459 Other low back pain: Secondary | ICD-10-CM | POA: Diagnosis not present

## 2020-06-24 NOTE — Telephone Encounter (Signed)
I called pt and she did go to ED, she saw PA , she was in recliner, assisted with walking in hallway to BR, think more back sprain.  Given percocet, muscle relaxant and lidocaine patch.  She has appt with Dr. Shon Baton [facility] this afternoon.  She had seen Dr. Shon Baton previously stated had MRI - referred to Dr. Antony Odea for R hip (never went to PT due to pandemic).  She had ESI back in [place] and again 08-02-2018.  I relayed we last saw her doing ok, refills we pts yearly.  She said that she would really like ESI which seemed to help and depending on what they say she may be calling us back.

## 2020-06-24 NOTE — Telephone Encounter (Signed)
LVM: went ED as suggested and they said to get in touch with an orthopedic or neurosurgeon. I have seen Dr. Epimenio Foot before. Could you have Sandy call me.

## 2020-06-25 ENCOUNTER — Encounter: Payer: Self-pay | Admitting: Registered"

## 2020-06-25 ENCOUNTER — Ambulatory Visit: Payer: Federal, State, Local not specified - PPO | Admitting: Psychologist

## 2020-06-25 ENCOUNTER — Encounter: Payer: Self-pay | Admitting: Cardiology

## 2020-06-25 ENCOUNTER — Other Ambulatory Visit: Payer: Self-pay

## 2020-06-25 ENCOUNTER — Encounter: Payer: Medicare Other | Attending: Family Medicine | Admitting: Registered"

## 2020-06-25 ENCOUNTER — Ambulatory Visit (INDEPENDENT_AMBULATORY_CARE_PROVIDER_SITE_OTHER): Payer: Medicare Other | Admitting: Cardiology

## 2020-06-25 VITALS — BP 121/64 | HR 78 | Ht 65.0 in | Wt 223.0 lb

## 2020-06-25 DIAGNOSIS — E118 Type 2 diabetes mellitus with unspecified complications: Secondary | ICD-10-CM | POA: Insufficient documentation

## 2020-06-25 DIAGNOSIS — I480 Paroxysmal atrial fibrillation: Secondary | ICD-10-CM

## 2020-06-25 DIAGNOSIS — E7800 Pure hypercholesterolemia, unspecified: Secondary | ICD-10-CM | POA: Diagnosis not present

## 2020-06-25 NOTE — Progress Notes (Signed)
Diabetes Self-Management Education  Visit Type: Follow-up  Appt. Start Time: 0830 Appt. End Time: 0900  06/25/2020  [name], identified by name and date of birth, is a 70 y.o. female with a diagnosis of Diabetes: Type 2.   ASSESSMENT  There were no vitals taken for this visit. There is no height or weight on file to calculate BMI.   Pt states she visited the emergency room due to back pain and also saw an orthopedic specialist who gave her oral steroid 6-day treatment. Pt states she was asked about her A1c first which she believed was around 7%. On Jan 6 per [place] (below), her blood sugar went over 350 mg/dL. Pt states she plans on contacting her PCP today. RD reassured patient that if her MD suggests insulin that it would most likely be temporary and after she is off steroids would likely be able to bring blood sugar back down with decreasing carb intake, increasing exercise and continued metformin.  Pt states before that she had a sinus infection, was tested for COVID and it was negative. RD encouraged patient to stay hydrated as well.   [place] with steroid treatment:   [place] prior to steroids:    Patient states she plans on attending the diabetes virtual support group again.   Diabetes Self-Management Education - 06/25/20 0900      Visit Information   Visit Type Follow-up      Initial Visit   Diabetes Type Type 2      Complications   How often do you check your blood sugar? 3-4 times/day    Fasting Blood glucose range (mg/dL) 180-200;130-179    Postprandial Blood glucose range (mg/dL) >200      Dietary Intake   Breakfast oatmeal, cinnamon, berries    Lunch ham sandwich, chips    Dinner chicken kabobs, rice, tandori      Exercise   Exercise Type Light (walking / raking leaves)      Patient Education   Nutrition management  Reviewed blood glucose goals for pre and post meals and how to evaluate the patients' food intake on their blood glucose  level.    Medications Other (comment)   need for medication while on steroids     Individualized Goals (developed by patient)   Nutrition Other (comment)   reduce simple carbs   Medications Other (comment)   contact PCP for mgmt with steroids   Monitoring  Other (comment)   scan every 4 hours to prevent gaps in [place]   Expected Outcomes Demonstrated interest in learning. Expect positive outcomes    Future DMSE 4-6 wks    Program Status Not Completed      Subsequent Visit   Since your last visit have you continued or begun to take your medications as prescribed? Yes   metformin   Since your last visit, are you checking your blood glucose at least once a day? Yes           Individualized Plan for Diabetes Self-Management Training:   Learning Objective:  Patient will have a greater understanding of diabetes self-management. Patient education plan is to attend individual and/or group sessions per assessed needs and concerns.    Patient Instructions  Get in touch with your PCP due to your high blood sugar with steroid therapy. Continue with physical therapy Consider having 1/2 bread for sandwiches and instead of chips consider vegetables. Switching up breakfast, sometimes have savory Continue  uploading your [place] data. Continue having Glucerna shakes for a quick option   Expected Outcomes:  Demonstrated interest in learning. Expect positive outcomes  Education material provided: none  If problems or questions, patient to contact team via:  Phone and MyChart  Future DSME appointment: 4-6 wks

## 2020-06-25 NOTE — Patient Instructions (Addendum)
Get in touch with your PCP due to your high blood sugar with steroid therapy. Continue with physical therapy Consider having 1/2 bread for sandwiches and instead of chips consider vegetables. Switching up breakfast, sometimes have savory Continue uploading your [place] data. Continue having Glucerna shakes for a quick option

## 2020-06-28 ENCOUNTER — Telehealth: Payer: Self-pay | Admitting: Registered"

## 2020-06-28 DIAGNOSIS — M6281 Muscle weakness (generalized): Secondary | ICD-10-CM | POA: Diagnosis not present

## 2020-06-28 DIAGNOSIS — M2569 Stiffness of other specified joint, not elsewhere classified: Secondary | ICD-10-CM | POA: Diagnosis not present

## 2020-06-28 DIAGNOSIS — R293 Abnormal posture: Secondary | ICD-10-CM | POA: Diagnosis not present

## 2020-06-28 DIAGNOSIS — M545 Low back pain, unspecified: Secondary | ICD-10-CM | POA: Diagnosis not present

## 2020-06-28 DIAGNOSIS — R262 Difficulty in walking, not elsewhere classified: Secondary | ICD-10-CM | POA: Diagnosis not present

## 2020-06-28 NOTE — Telephone Encounter (Signed)
Pt called to update RD on what happened after her appointment on Friday. She states she contacted her PCP and her orthopedic doctor and was told if her blood sugar got up to 400 to go to ER. Pt states she didn't want to worry about that so she stopped the steroid treatment. Pt reports her FBS was 136 this morning and feeling better about that. Pt reports back is still hurting, but able to take of herself and has an appointment for physical therapy evaluation today. Pt plans to be on the Support Group webex meeting tonight.

## 2020-06-29 DIAGNOSIS — R262 Difficulty in walking, not elsewhere classified: Secondary | ICD-10-CM | POA: Diagnosis not present

## 2020-06-29 DIAGNOSIS — M2569 Stiffness of other specified joint, not elsewhere classified: Secondary | ICD-10-CM | POA: Diagnosis not present

## 2020-06-29 DIAGNOSIS — R293 Abnormal posture: Secondary | ICD-10-CM | POA: Diagnosis not present

## 2020-06-29 DIAGNOSIS — M6281 Muscle weakness (generalized): Secondary | ICD-10-CM | POA: Diagnosis not present

## 2020-06-29 DIAGNOSIS — M545 Low back pain, unspecified: Secondary | ICD-10-CM | POA: Diagnosis not present

## 2020-07-01 DIAGNOSIS — M6281 Muscle weakness (generalized): Secondary | ICD-10-CM | POA: Diagnosis not present

## 2020-07-01 DIAGNOSIS — M2569 Stiffness of other specified joint, not elsewhere classified: Secondary | ICD-10-CM | POA: Diagnosis not present

## 2020-07-01 DIAGNOSIS — R262 Difficulty in walking, not elsewhere classified: Secondary | ICD-10-CM | POA: Diagnosis not present

## 2020-07-01 DIAGNOSIS — M545 Low back pain, unspecified: Secondary | ICD-10-CM | POA: Diagnosis not present

## 2020-07-01 DIAGNOSIS — R293 Abnormal posture: Secondary | ICD-10-CM | POA: Diagnosis not present

## 2020-07-05 DIAGNOSIS — G4700 Insomnia, unspecified: Secondary | ICD-10-CM | POA: Diagnosis not present

## 2020-07-05 DIAGNOSIS — E7800 Pure hypercholesterolemia, unspecified: Secondary | ICD-10-CM | POA: Diagnosis not present

## 2020-07-05 DIAGNOSIS — E1140 Type 2 diabetes mellitus with diabetic neuropathy, unspecified: Secondary | ICD-10-CM | POA: Diagnosis not present

## 2020-07-05 DIAGNOSIS — E1159 Type 2 diabetes mellitus with other circulatory complications: Secondary | ICD-10-CM | POA: Diagnosis not present

## 2020-07-05 DIAGNOSIS — E119 Type 2 diabetes mellitus without complications: Secondary | ICD-10-CM | POA: Diagnosis not present

## 2020-07-05 DIAGNOSIS — J45909 Unspecified asthma, uncomplicated: Secondary | ICD-10-CM | POA: Diagnosis not present

## 2020-07-05 DIAGNOSIS — E1149 Type 2 diabetes mellitus with other diabetic neurological complication: Secondary | ICD-10-CM | POA: Diagnosis not present

## 2020-07-05 DIAGNOSIS — K219 Gastro-esophageal reflux disease without esophagitis: Secondary | ICD-10-CM | POA: Diagnosis not present

## 2020-07-05 DIAGNOSIS — I4891 Unspecified atrial fibrillation: Secondary | ICD-10-CM | POA: Diagnosis not present

## 2020-07-05 DIAGNOSIS — F331 Major depressive disorder, recurrent, moderate: Secondary | ICD-10-CM | POA: Diagnosis not present

## 2020-07-07 DIAGNOSIS — M6281 Muscle weakness (generalized): Secondary | ICD-10-CM | POA: Diagnosis not present

## 2020-07-07 DIAGNOSIS — R262 Difficulty in walking, not elsewhere classified: Secondary | ICD-10-CM | POA: Diagnosis not present

## 2020-07-07 DIAGNOSIS — M545 Low back pain, unspecified: Secondary | ICD-10-CM | POA: Diagnosis not present

## 2020-07-07 DIAGNOSIS — M2569 Stiffness of other specified joint, not elsewhere classified: Secondary | ICD-10-CM | POA: Diagnosis not present

## 2020-07-07 DIAGNOSIS — R293 Abnormal posture: Secondary | ICD-10-CM | POA: Diagnosis not present

## 2020-07-09 DIAGNOSIS — M545 Low back pain, unspecified: Secondary | ICD-10-CM | POA: Diagnosis not present

## 2020-07-09 DIAGNOSIS — M2569 Stiffness of other specified joint, not elsewhere classified: Secondary | ICD-10-CM | POA: Diagnosis not present

## 2020-07-09 DIAGNOSIS — R262 Difficulty in walking, not elsewhere classified: Secondary | ICD-10-CM | POA: Diagnosis not present

## 2020-07-09 DIAGNOSIS — R293 Abnormal posture: Secondary | ICD-10-CM | POA: Diagnosis not present

## 2020-07-09 DIAGNOSIS — M6281 Muscle weakness (generalized): Secondary | ICD-10-CM | POA: Diagnosis not present

## 2020-07-12 ENCOUNTER — Ambulatory Visit: Payer: Federal, State, Local not specified - PPO | Admitting: Psychologist

## 2020-07-14 DIAGNOSIS — M545 Low back pain, unspecified: Secondary | ICD-10-CM | POA: Diagnosis not present

## 2020-07-14 DIAGNOSIS — M2569 Stiffness of other specified joint, not elsewhere classified: Secondary | ICD-10-CM | POA: Diagnosis not present

## 2020-07-14 DIAGNOSIS — R293 Abnormal posture: Secondary | ICD-10-CM | POA: Diagnosis not present

## 2020-07-14 DIAGNOSIS — R262 Difficulty in walking, not elsewhere classified: Secondary | ICD-10-CM | POA: Diagnosis not present

## 2020-07-14 DIAGNOSIS — M6281 Muscle weakness (generalized): Secondary | ICD-10-CM | POA: Diagnosis not present

## 2020-07-16 DIAGNOSIS — R197 Diarrhea, unspecified: Secondary | ICD-10-CM | POA: Diagnosis not present

## 2020-07-16 DIAGNOSIS — R5383 Other fatigue: Secondary | ICD-10-CM | POA: Diagnosis not present

## 2020-07-19 DIAGNOSIS — R5383 Other fatigue: Secondary | ICD-10-CM | POA: Diagnosis not present

## 2020-07-19 DIAGNOSIS — R293 Abnormal posture: Secondary | ICD-10-CM | POA: Diagnosis not present

## 2020-07-19 DIAGNOSIS — R197 Diarrhea, unspecified: Secondary | ICD-10-CM | POA: Diagnosis not present

## 2020-07-19 DIAGNOSIS — M545 Low back pain, unspecified: Secondary | ICD-10-CM | POA: Diagnosis not present

## 2020-07-19 DIAGNOSIS — R262 Difficulty in walking, not elsewhere classified: Secondary | ICD-10-CM | POA: Diagnosis not present

## 2020-07-19 DIAGNOSIS — M2569 Stiffness of other specified joint, not elsewhere classified: Secondary | ICD-10-CM | POA: Diagnosis not present

## 2020-07-19 DIAGNOSIS — M6281 Muscle weakness (generalized): Secondary | ICD-10-CM | POA: Diagnosis not present

## 2020-07-21 DIAGNOSIS — M545 Low back pain, unspecified: Secondary | ICD-10-CM | POA: Diagnosis not present

## 2020-07-21 DIAGNOSIS — R262 Difficulty in walking, not elsewhere classified: Secondary | ICD-10-CM | POA: Diagnosis not present

## 2020-07-21 DIAGNOSIS — M6281 Muscle weakness (generalized): Secondary | ICD-10-CM | POA: Diagnosis not present

## 2020-07-21 DIAGNOSIS — R293 Abnormal posture: Secondary | ICD-10-CM | POA: Diagnosis not present

## 2020-07-21 DIAGNOSIS — M2569 Stiffness of other specified joint, not elsewhere classified: Secondary | ICD-10-CM | POA: Diagnosis not present

## 2020-07-23 ENCOUNTER — Encounter: Payer: Self-pay | Admitting: Registered"

## 2020-07-23 ENCOUNTER — Other Ambulatory Visit: Payer: Self-pay

## 2020-07-23 ENCOUNTER — Encounter: Payer: Medicare Other | Attending: Family Medicine | Admitting: Registered"

## 2020-07-23 DIAGNOSIS — R293 Abnormal posture: Secondary | ICD-10-CM | POA: Diagnosis not present

## 2020-07-23 DIAGNOSIS — M2569 Stiffness of other specified joint, not elsewhere classified: Secondary | ICD-10-CM | POA: Diagnosis not present

## 2020-07-23 DIAGNOSIS — R262 Difficulty in walking, not elsewhere classified: Secondary | ICD-10-CM | POA: Diagnosis not present

## 2020-07-23 DIAGNOSIS — M545 Low back pain, unspecified: Secondary | ICD-10-CM | POA: Diagnosis not present

## 2020-07-23 DIAGNOSIS — E118 Type 2 diabetes mellitus with unspecified complications: Secondary | ICD-10-CM | POA: Diagnosis not present

## 2020-07-23 DIAGNOSIS — M6281 Muscle weakness (generalized): Secondary | ICD-10-CM | POA: Diagnosis not present

## 2020-07-23 NOTE — Patient Instructions (Addendum)
Goals: Ride your bike while watching TV: Tues, Thurs, Sat. Approximately 30 min as tolerated Getting a new mattress to help sleep. Consider turning off the TV at least 1 hr before planning to sleep Look for Body Scan meditation for sleep Continue with food prepping, and including fruits and vegetables in diet daily

## 2020-07-23 NOTE — Progress Notes (Signed)
Diabetes Self-Management Education  Visit Type: Follow-up  Appt. Start Time: 0930 Appt. End Time: 1000  07/23/2020  [name], identified by name and date of birth, is a 70 y.o. female with a diagnosis of Diabetes: Type 2.   ASSESSMENT  There were no vitals taken for this visit. There is no height or weight on file to calculate BMI.   Pt reports since last visit she has been sick and no energy with sinus infection and 2 courses of antibiotics, second round had to stop at day 5 due to side effects. Pt states according to her CPAP information she was only getting 4-5 hrs of sleep during that month and blood sugar all over the place. Pt states she thinks some of her sinus infections comes from using CPAP and is looking into surgical implant device, but would be far out into the future. Pt reports for the last 3 days feels back to normal.   Pt states visits helps her stay on course and encourage her to keep working on managing blood sugar.  Patient has been uploading CGM data but felt she understood her readings and we did not discuss specific numbers today.     Diabetes Self-Management Education - 07/23/20 0900      Visit Information   Visit Type Follow-up      Initial Visit   Diabetes Type Type 2      Complications   How often do you check your blood sugar? --   CGM     Dietary Intake   Breakfast bacon, egg, cheese biscuit    Lunch 1/2 c cappachino baked apple chips, popcorn, crackers with bologna and cheese   playing cards - snacked   Snack (afternoon) jelly beans    Dinner burger, ketchup, rellish, frozen green beans, mushroom, risito    Snack (evening) none    Beverage(s) water with lemon, hint,      Exercise   Exercise Type Light (walking / raking leaves)   physical therapy   How many days per week to you exercise? 3    How many minutes per day do you exercise? 60    Total minutes per week of exercise 180      Patient Education   Nutrition management  Other  (comment)   role of fruits & veg in reducing inflammation   Personal strategies to promote health Other (comment)   work on better sleep     Individualized Goals (developed by patient)   Nutrition Other (comment)   meal prepping   Physical Activity Exercise 5-7 days per week    Problem Solving --   working on better sleep     Patient Self-Evaluation of Goals - Patient rates self as meeting previously set goals (% of time)   Medications >75%    Monitoring 50 - 75 %      Outcomes   Expected Outcomes Demonstrated interest in learning. Expect positive outcomes    Future DMSE 4-6 wks    Program Status Completed      Subsequent Visit   Since your last visit have you continued or begun to take your medications as prescribed? Yes           Individualized Plan for Diabetes Self-Management Training:   Learning Objective:  Patient will have a greater understanding of diabetes self-management. Patient education plan is to attend individual and/or group sessions per assessed needs and concerns.   Patient Instructions  Goals: Ride your bike while watching TV:  Tues, Thurs, Sat. Approximately 30 min as tolerated Getting a new mattress to help sleep. Consider turning off the TV at least 1 hr before planning to sleep Look for Body Scan meditation for sleep Continue with food prepping, and including fruits and vegetables in diet daily   Expected Outcomes:  Demonstrated interest in learning. Expect positive outcomes  Education material provided: none  If problems or questions, patient to contact team via:  Phone and MyChart  Future DSME appointment: 4-6 wks

## 2020-07-26 DIAGNOSIS — J384 Edema of larynx: Secondary | ICD-10-CM | POA: Diagnosis not present

## 2020-07-26 DIAGNOSIS — K219 Gastro-esophageal reflux disease without esophagitis: Secondary | ICD-10-CM | POA: Diagnosis not present

## 2020-07-26 DIAGNOSIS — H8109 Meniere's disease, unspecified ear: Secondary | ICD-10-CM | POA: Diagnosis not present

## 2020-07-26 DIAGNOSIS — J301 Allergic rhinitis due to pollen: Secondary | ICD-10-CM | POA: Diagnosis not present

## 2020-07-26 DIAGNOSIS — K148 Other diseases of tongue: Secondary | ICD-10-CM | POA: Diagnosis not present

## 2020-07-26 DIAGNOSIS — R067 Sneezing: Secondary | ICD-10-CM | POA: Diagnosis not present

## 2020-07-26 DIAGNOSIS — J4540 Moderate persistent asthma, uncomplicated: Secondary | ICD-10-CM | POA: Diagnosis not present

## 2020-07-27 DIAGNOSIS — M6281 Muscle weakness (generalized): Secondary | ICD-10-CM | POA: Diagnosis not present

## 2020-07-27 DIAGNOSIS — D6869 Other thrombophilia: Secondary | ICD-10-CM | POA: Diagnosis not present

## 2020-07-27 DIAGNOSIS — R293 Abnormal posture: Secondary | ICD-10-CM | POA: Diagnosis not present

## 2020-07-27 DIAGNOSIS — I4891 Unspecified atrial fibrillation: Secondary | ICD-10-CM | POA: Diagnosis not present

## 2020-07-27 DIAGNOSIS — M545 Low back pain, unspecified: Secondary | ICD-10-CM | POA: Diagnosis not present

## 2020-07-27 DIAGNOSIS — M2569 Stiffness of other specified joint, not elsewhere classified: Secondary | ICD-10-CM | POA: Diagnosis not present

## 2020-07-27 DIAGNOSIS — R262 Difficulty in walking, not elsewhere classified: Secondary | ICD-10-CM | POA: Diagnosis not present

## 2020-07-27 DIAGNOSIS — M5416 Radiculopathy, lumbar region: Secondary | ICD-10-CM | POA: Diagnosis not present

## 2020-08-11 DIAGNOSIS — F331 Major depressive disorder, recurrent, moderate: Secondary | ICD-10-CM | POA: Diagnosis not present

## 2020-08-11 DIAGNOSIS — E1149 Type 2 diabetes mellitus with other diabetic neurological complication: Secondary | ICD-10-CM | POA: Diagnosis not present

## 2020-08-11 DIAGNOSIS — E119 Type 2 diabetes mellitus without complications: Secondary | ICD-10-CM | POA: Diagnosis not present

## 2020-08-11 DIAGNOSIS — E1140 Type 2 diabetes mellitus with diabetic neuropathy, unspecified: Secondary | ICD-10-CM | POA: Diagnosis not present

## 2020-08-11 DIAGNOSIS — G8929 Other chronic pain: Secondary | ICD-10-CM | POA: Diagnosis not present

## 2020-08-11 DIAGNOSIS — J45909 Unspecified asthma, uncomplicated: Secondary | ICD-10-CM | POA: Diagnosis not present

## 2020-08-11 DIAGNOSIS — H259 Unspecified age-related cataract: Secondary | ICD-10-CM | POA: Diagnosis not present

## 2020-08-11 DIAGNOSIS — I4891 Unspecified atrial fibrillation: Secondary | ICD-10-CM | POA: Diagnosis not present

## 2020-08-11 DIAGNOSIS — E7800 Pure hypercholesterolemia, unspecified: Secondary | ICD-10-CM | POA: Diagnosis not present

## 2020-08-11 DIAGNOSIS — K219 Gastro-esophageal reflux disease without esophagitis: Secondary | ICD-10-CM | POA: Diagnosis not present

## 2020-08-11 DIAGNOSIS — E1159 Type 2 diabetes mellitus with other circulatory complications: Secondary | ICD-10-CM | POA: Diagnosis not present

## 2020-08-11 DIAGNOSIS — G4700 Insomnia, unspecified: Secondary | ICD-10-CM | POA: Diagnosis not present

## 2020-08-12 DIAGNOSIS — M5416 Radiculopathy, lumbar region: Secondary | ICD-10-CM | POA: Diagnosis not present

## 2020-08-20 DIAGNOSIS — M5459 Other low back pain: Secondary | ICD-10-CM | POA: Diagnosis not present

## 2020-08-20 DIAGNOSIS — M5416 Radiculopathy, lumbar region: Secondary | ICD-10-CM | POA: Diagnosis not present

## 2020-08-30 DIAGNOSIS — J209 Acute bronchitis, unspecified: Secondary | ICD-10-CM | POA: Diagnosis not present

## 2020-08-31 DIAGNOSIS — E11621 Type 2 diabetes mellitus with foot ulcer: Secondary | ICD-10-CM | POA: Diagnosis not present

## 2020-08-31 DIAGNOSIS — E119 Type 2 diabetes mellitus without complications: Secondary | ICD-10-CM | POA: Diagnosis not present

## 2020-08-31 DIAGNOSIS — L89892 Pressure ulcer of other site, stage 2: Secondary | ICD-10-CM | POA: Diagnosis not present

## 2020-08-31 DIAGNOSIS — B351 Tinea unguium: Secondary | ICD-10-CM | POA: Diagnosis not present

## 2020-08-31 DIAGNOSIS — M79671 Pain in right foot: Secondary | ICD-10-CM | POA: Diagnosis not present

## 2020-08-31 DIAGNOSIS — M79672 Pain in left foot: Secondary | ICD-10-CM | POA: Diagnosis not present

## 2020-09-03 ENCOUNTER — Encounter: Payer: Federal, State, Local not specified - PPO | Admitting: Registered"

## 2020-09-08 NOTE — Progress Notes (Unsigned)
Cardiology Office Note   Date:  09/09/2020   ID:  Suzanne Greer, Suzanne Greer 1950-10-25, MRN [id number]  PCP:  Vernie Shanks, MD  Cardiologist:   Peter Martinique, MD   Chief Complaint  Patient presents with  . Atrial Fibrillation      History of Present Illness: Suzanne Greer is a 70 y.o. female who is seen for follow up Atrial fibrillation/flutter. She has a history of HTN, OSA, and DM.  Has been in Rhythmol and Eliquis for years. She had a remote Echo in 2011 that showed Mild LVH otherwise normal. She reports a Myoview study years ago at [facility] that apparently was normal. She is on CPAP therapy. She reports that in December she was having increased symptoms with Afib and was placed on diltiazem in addition to propafenone and metoprolol. She states she was having episodes at least 2 days a week. When her HR is over 100 she feels palpitations. When HR is up to 150 she feels extremely tired and has chest tightness and pressure like something sitting on her chest. She does have chronic dizziness but attributes this to Meniere's disease. She does not drink caffeine.  Echo in February 2021 was normal.  On follow up today she reports that since December she has had back problems and now has some sciatica. She also has been battling bronchitis and is on her second course of antibiotics. With her bronchitis she has noted more Afib but rate typically <120. Prior to infection she had Afib only infrequently. Ecg in Jan she was in NSR.    Past Medical History:  Diagnosis Date  . Atrial fibrillation ([place])   . Back pain   . Diabetes mellitus   . Dysrhythmia    history Atrial Flutter. -Dr, Chiu-cardiology -[place] [facility] follows  . Fibromyalgia   . GERD (gastroesophageal reflux disease)   . Hip pain, bilateral   . Hx of seasonal allergies   . Hyperlipidemia   . Hypertension   . Insomnia   . 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 Obesity   . Restless  legs   . Sleep apnea    no cpap use now-unable to tolerate mask, surgery a yr ago  . Vertigo     Past Surgical History:  Procedure Laterality Date  . APPENDECTOMY    . BUNIONECTOMY    . ESOPHAGOGASTRODUODENOSCOPY (EGD) WITH PROPOFOL N/A 08/16/2015   Procedure: ESOPHAGOGASTRODUODENOSCOPY (EGD) WITH PROPOFOL;  Surgeon: Garlan Fair, MD;  Location: [facility];  Service: Endoscopy;  Laterality: N/A;  . GANGLION CYST   1980   REMOVED   . HEMORROIDECTOMY    . THYROIDECTOMY, PARTIAL  2005   "partial for growth-benign"  . UPPER ENDOSCOPIC ULTRASOUND W/ FNA    . UVULECTOMY     excised about a yr ago [facility]     Current Outpatient Medications  Medication Sig Dispense Refill  . atorvastatin (LIPITOR) 20 MG tablet Take 20 mg by mouth daily.    . cefdinir (OMNICEF) 300 MG capsule Take 300 mg by mouth 2 (two) times daily.    . cyclobenzaprine (FLEXERIL) 10 MG tablet Take one tablet at bedtime. 30 tablet 5  . fluticasone (FLONASE) 50 MCG/ACT nasal spray Place 2 sprays into both nostrils daily.    . Magnesium 400 MG TABS Take 400 mg by mouth daily.     . Melatonin 10 MG TABS Take 10 mg by mouth at bedtime.     Marland Kitchen  metFORMIN (GLUCOPHAGE-XR) 500 MG 24 hr tablet Take 1,000 mg by mouth 2 (two) times daily.     . montelukast (SINGULAIR) 10 MG tablet Take 10 mg by mouth at bedtime.    Marland Kitchen omeprazole (PRILOSEC) 40 MG capsule omeprazole 40 mg capsule,delayed release    . traMADol (ULTRAM) 50 MG tablet Take 50 mg by mouth 3 (three) times daily as needed (Pain).     Marland Kitchen VITAMIN D PO Take 1 tablet by mouth daily.    Marland Kitchen apixaban (ELIQUIS) 5 MG TABS tablet Take 1 tablet (5 mg total) by mouth 2 (two) times daily. 180 tablet 3  . diltiazem (CARDIZEM) 120 MG tablet Take 1 tablet (120 mg total) by mouth daily. 90 tablet 3  . metoprolol succinate (TOPROL-XL) 50 MG 24 hr tablet Take 1 tablet (50 mg total) by mouth daily. 90 tablet 3  . propafenone (RYTHMOL) 300 MG tablet Take 1 tablet (300 mg total) by  mouth every 8 (eight) hours. 270 tablet 3   No current facility-administered medications for this visit.    Allergies:   Doxycycline, Yeast-related products, Molds & smuts, Penicillins, and Sulfa drugs cross reactors    Social History:  The patient  reports that she has never smoked. She has never used smokeless tobacco. She reports that she does not drink alcohol and does not use drugs.   Family History:  The patient's family history includes Cancer (age of onset: 71) in her mother; Diabetes in her father; Drug abuse in her brother; Heart disease in her mother; Hepatitis C in her brother.    ROS:  Please see the history of present illness.   Otherwise, review of systems are positive for none.   All other systems are reviewed and negative.    PHYSICAL EXAM: VS:  BP 130/82   Pulse 86   Ht 5\' 5"  (1.651 m)   Wt 223 lb (101.2 kg)   SpO2 96%   BMI 37.11 kg/m  , BMI Body mass index is 37.11 kg/m. GEN: Well nourished, overweight, in no acute distress  HEENT: normal  Neck: no JVD, carotid bruits, or masses Cardiac: IRRR; no murmurs, rubs, or gallops,no edema  Respiratory:  clear to auscultation bilaterally, normal work of breathing GI: soft, nontender, nondistended, + BS MS: no deformity or atrophy  Skin: warm and dry, no rash Neuro:  Strength and sensation are intact Psych: euthymic mood, full affect   EKG:  EKG is not ordered today.    Recent Labs: No results found for requested labs within last 8760 hours.    Lipid Panel No results found for: CHOL, TRIG, HDL, CHOLHDL, VLDL, LDLCALC, LDLDIRECT   Dated 09/25/18: A1c 7.6%. cholesterol 216, triglycerides 134, HDL 56, LDL 133. CMET and TSH normal. Dated 10/21/19: A1c 7.2%. cholesterol 157, triglycerides 185, HDL 54, LDL 72. Glucose 160 otherwise CMET normal.  Dated 04/23/20: A1c 7%. Cholesterol 200, triglycerides 161, HDL 55, LDL 117. Glucose 154 otherwise CMET and TSH normal.   Wt Readings from Last 3 Encounters:  09/09/20 223  lb (101.2 kg)  06/25/20 223 lb (101.2 kg)  01/27/20 222 lb 6.4 oz (100.9 kg)      Other studies Reviewed: Additional studies/ records that were reviewed today include:   Echo 07/30/19: IMPRESSIONS    1. Left ventricular ejection fraction by 3D volume is 57 %. The left  ventricle has normal function. The left ventrical has no regional wall  motion abnormalities. There is mildly increased concentric left  ventricular hypertrophy. Left ventricular  diastolic parameters are consistent with Grade I diastolic dysfunction  (impaired relaxation). The average left ventricular global longitudinal  strain is -18.5 %.  2. Right ventricular systolic function is mildly reduced. The right  ventricular size is normal. Tricuspid regurgitation signal is inadequate  for assessing PA pressure.  3. The mitral valve is normal in structure and function. trivial mitral  valve regurgitation. No evidence of mitral stenosis.  4. The aortic valve is normal in structure and function. Aortic valve  regurgitation is not visualized. No aortic stenosis is present.   ASSESSMENT AND PLAN:  1.  Paroxysmal Atrial fibrillation. Patient on long term AAD therapy with propafenone. On Diltiazem and Toprol for rate control.   She has a [place] Vasc score of  3-4 so is on Eliquis. Understandably she has more Afib associated with bronchitis. This is improving. If she does have more AFib uncontrolled with current therapy then I would recommend EP evaluation for possible ablation. 2. DM type 2.  Last A1c 7.0%. reports recent increase in BS.  3. HLD. On lipitor 4. OSA on CPAP. Stressed importance of keeping weight under control.   Current medicines are reviewed at length with the patient today.  The patient does not have concerns regarding medicines.  The following changes have been made:  no change  Labs/ tests ordered today include:   No orders of the defined types were placed in this encounter.    Disposition:   FU  with me in one year. Medications refilled today.  Signed, Peter Martinique, MD  09/09/2020 9:15 AM    [facility] [address], [place], [place], [address] Phone [phone], Fax [phone]

## 2020-09-09 ENCOUNTER — Encounter: Payer: Self-pay | Admitting: Cardiology

## 2020-09-09 ENCOUNTER — Other Ambulatory Visit: Payer: Self-pay

## 2020-09-09 ENCOUNTER — Ambulatory Visit (INDEPENDENT_AMBULATORY_CARE_PROVIDER_SITE_OTHER): Payer: Medicare Other | Admitting: Cardiology

## 2020-09-09 VITALS — BP 130/82 | HR 86 | Ht 65.0 in | Wt 223.0 lb

## 2020-09-09 DIAGNOSIS — G4733 Obstructive sleep apnea (adult) (pediatric): Secondary | ICD-10-CM | POA: Diagnosis not present

## 2020-09-09 DIAGNOSIS — E7800 Pure hypercholesterolemia, unspecified: Secondary | ICD-10-CM

## 2020-09-09 DIAGNOSIS — I480 Paroxysmal atrial fibrillation: Secondary | ICD-10-CM | POA: Diagnosis not present

## 2020-09-09 DIAGNOSIS — E118 Type 2 diabetes mellitus with unspecified complications: Secondary | ICD-10-CM

## 2020-09-09 DIAGNOSIS — M5459 Other low back pain: Secondary | ICD-10-CM | POA: Diagnosis not present

## 2020-09-09 DIAGNOSIS — M5416 Radiculopathy, lumbar region: Secondary | ICD-10-CM | POA: Diagnosis not present

## 2020-09-09 MED ORDER — DILTIAZEM HCL 120 MG PO TABS: 120.0000 mg | ORAL_TABLET | Freq: Every day | ORAL | 3 refills | Status: DC

## 2020-09-09 MED ORDER — PROPAFENONE HCL 300 MG PO TABS: 300.0000 mg | ORAL_TABLET | Freq: Three times a day (TID) | ORAL | 3 refills | Status: DC

## 2020-09-09 MED ORDER — APIXABAN 5 MG PO TABS: 5.0000 mg | ORAL_TABLET | Freq: Two times a day (BID) | ORAL | 3 refills | Status: DC

## 2020-09-09 MED ORDER — METOPROLOL SUCCINATE ER 50 MG PO TB24
50.0000 mg | ORAL_TABLET | Freq: Every day | ORAL | 3 refills | Status: AC
Start: 2020-09-09 — End: ?

## 2020-09-14 DIAGNOSIS — E7800 Pure hypercholesterolemia, unspecified: Secondary | ICD-10-CM | POA: Diagnosis not present

## 2020-09-14 DIAGNOSIS — E119 Type 2 diabetes mellitus without complications: Secondary | ICD-10-CM | POA: Diagnosis not present

## 2020-09-14 DIAGNOSIS — E1149 Type 2 diabetes mellitus with other diabetic neurological complication: Secondary | ICD-10-CM | POA: Diagnosis not present

## 2020-09-14 DIAGNOSIS — G8929 Other chronic pain: Secondary | ICD-10-CM | POA: Diagnosis not present

## 2020-09-14 DIAGNOSIS — E1159 Type 2 diabetes mellitus with other circulatory complications: Secondary | ICD-10-CM | POA: Diagnosis not present

## 2020-09-14 DIAGNOSIS — F331 Major depressive disorder, recurrent, moderate: Secondary | ICD-10-CM | POA: Diagnosis not present

## 2020-09-14 DIAGNOSIS — I4891 Unspecified atrial fibrillation: Secondary | ICD-10-CM | POA: Diagnosis not present

## 2020-09-14 DIAGNOSIS — G4700 Insomnia, unspecified: Secondary | ICD-10-CM | POA: Diagnosis not present

## 2020-09-14 DIAGNOSIS — J45909 Unspecified asthma, uncomplicated: Secondary | ICD-10-CM | POA: Diagnosis not present

## 2020-09-14 DIAGNOSIS — E1140 Type 2 diabetes mellitus with diabetic neuropathy, unspecified: Secondary | ICD-10-CM | POA: Diagnosis not present

## 2020-09-14 DIAGNOSIS — K219 Gastro-esophageal reflux disease without esophagitis: Secondary | ICD-10-CM | POA: Diagnosis not present

## 2020-09-14 DIAGNOSIS — Z124 Encounter for screening for malignant neoplasm of cervix: Secondary | ICD-10-CM | POA: Diagnosis not present

## 2020-09-14 DIAGNOSIS — Z01419 Encounter for gynecological examination (general) (routine) without abnormal findings: Secondary | ICD-10-CM | POA: Diagnosis not present

## 2020-09-17 ENCOUNTER — Other Ambulatory Visit: Payer: Self-pay

## 2020-09-17 DIAGNOSIS — M5416 Radiculopathy, lumbar region: Secondary | ICD-10-CM | POA: Diagnosis not present

## 2020-09-17 MED ORDER — DILTIAZEM HCL ER COATED BEADS 120 MG PO CP24: 120.0000 mg | ORAL_CAPSULE | Freq: Every day | ORAL | 3 refills | Status: DC

## 2020-09-21 DIAGNOSIS — M47816 Spondylosis without myelopathy or radiculopathy, lumbar region: Secondary | ICD-10-CM | POA: Diagnosis not present

## 2020-09-22 ENCOUNTER — Telehealth: Payer: Self-pay | Admitting: Cardiology

## 2020-09-22 ENCOUNTER — Other Ambulatory Visit: Payer: Self-pay

## 2020-09-22 MED ORDER — ELIQUIS 5 MG PO TABS: 5.0000 mg | ORAL_TABLET | Freq: Two times a day (BID) | ORAL | 3 refills | Status: DC

## 2020-09-22 NOTE — Telephone Encounter (Signed)
Pt c/o medication issue:  1. Name of Medication: apixaban (ELIQUIS) 5 MG TABS tablet  2. How are you currently taking this medication (dosage and times per day)?  As written  3. Are you having a reaction (difficulty breathing--STAT)?  No  4. What is your medication issue? Patient has no more medication left or refills. Please send a new prescription to pharmacy

## 2020-09-22 NOTE — Telephone Encounter (Signed)
Sent RX to pharmacy 

## 2020-09-22 NOTE — Telephone Encounter (Signed)
Mychart message was sent over and addressed.  We submitted the medication back over to the pharmacy, advising she had been seen.  Patient advised to let us know if she has continued issues.

## 2020-09-22 NOTE — Telephone Encounter (Signed)
New Message:     Pt said the pharmacist said that her Eliquis was denied,because she need an appt. Pt said she was just seen on 09-09-20. Pt said to please call her before calling in another refill.

## 2020-09-22 NOTE — Telephone Encounter (Signed)
Called patient to make sure she had received most recent refill- she has not call the pharmacy yet but did advise her to let us know if she had issues.

## 2020-09-23 DIAGNOSIS — J45909 Unspecified asthma, uncomplicated: Secondary | ICD-10-CM | POA: Diagnosis not present

## 2020-09-23 DIAGNOSIS — G4700 Insomnia, unspecified: Secondary | ICD-10-CM | POA: Diagnosis not present

## 2020-09-23 DIAGNOSIS — E1140 Type 2 diabetes mellitus with diabetic neuropathy, unspecified: Secondary | ICD-10-CM | POA: Diagnosis not present

## 2020-09-23 DIAGNOSIS — E1159 Type 2 diabetes mellitus with other circulatory complications: Secondary | ICD-10-CM | POA: Diagnosis not present

## 2020-09-23 DIAGNOSIS — R030 Elevated blood-pressure reading, without diagnosis of hypertension: Secondary | ICD-10-CM | POA: Diagnosis not present

## 2020-09-23 DIAGNOSIS — H259 Unspecified age-related cataract: Secondary | ICD-10-CM | POA: Diagnosis not present

## 2020-09-23 DIAGNOSIS — G8929 Other chronic pain: Secondary | ICD-10-CM | POA: Diagnosis not present

## 2020-09-23 DIAGNOSIS — F331 Major depressive disorder, recurrent, moderate: Secondary | ICD-10-CM | POA: Diagnosis not present

## 2020-09-23 DIAGNOSIS — I4891 Unspecified atrial fibrillation: Secondary | ICD-10-CM | POA: Diagnosis not present

## 2020-09-23 DIAGNOSIS — K219 Gastro-esophageal reflux disease without esophagitis: Secondary | ICD-10-CM | POA: Diagnosis not present

## 2020-09-23 DIAGNOSIS — E7800 Pure hypercholesterolemia, unspecified: Secondary | ICD-10-CM | POA: Diagnosis not present

## 2020-09-25 ENCOUNTER — Emergency Department (HOSPITAL_BASED_OUTPATIENT_CLINIC_OR_DEPARTMENT_OTHER)
Admission: EM | Admit: 2020-09-25 | Discharge: 2020-09-25 | Disposition: A | Discharge status: Home / Self Care | Payer: Medicare Other | Attending: Emergency Medicine | Admitting: Emergency Medicine

## 2020-09-25 ENCOUNTER — Emergency Department (HOSPITAL_BASED_OUTPATIENT_CLINIC_OR_DEPARTMENT_OTHER): Payer: Medicare Other

## 2020-09-25 ENCOUNTER — Other Ambulatory Visit: Payer: Self-pay

## 2020-09-25 ENCOUNTER — Encounter (HOSPITAL_BASED_OUTPATIENT_CLINIC_OR_DEPARTMENT_OTHER): Payer: Self-pay | Admitting: *Deleted

## 2020-09-25 DIAGNOSIS — I4891 Unspecified atrial fibrillation: Secondary | ICD-10-CM | POA: Insufficient documentation

## 2020-09-25 DIAGNOSIS — I10 Essential (primary) hypertension: Secondary | ICD-10-CM | POA: Diagnosis not present

## 2020-09-25 DIAGNOSIS — Z79899 Other long term (current) drug therapy: Secondary | ICD-10-CM | POA: Diagnosis not present

## 2020-09-25 DIAGNOSIS — W0110XA Fall on same level from slipping, tripping and stumbling with subsequent striking against unspecified object, initial encounter: Secondary | ICD-10-CM | POA: Diagnosis not present

## 2020-09-25 DIAGNOSIS — Z7901 Long term (current) use of anticoagulants: Secondary | ICD-10-CM | POA: Insufficient documentation

## 2020-09-25 DIAGNOSIS — E119 Type 2 diabetes mellitus without complications: Secondary | ICD-10-CM | POA: Diagnosis not present

## 2020-09-25 DIAGNOSIS — Z7984 Long term (current) use of oral hypoglycemic drugs: Secondary | ICD-10-CM | POA: Insufficient documentation

## 2020-09-25 DIAGNOSIS — R519 Headache, unspecified: Secondary | ICD-10-CM | POA: Diagnosis not present

## 2020-09-25 DIAGNOSIS — S0003XA Contusion of scalp, initial encounter: Secondary | ICD-10-CM | POA: Insufficient documentation

## 2020-09-25 DIAGNOSIS — Y92007 Garden or yard of unspecified non-institutional (private) residence as the place of occurrence of the external cause: Secondary | ICD-10-CM | POA: Insufficient documentation

## 2020-09-25 DIAGNOSIS — W19XXXA Unspecified fall, initial encounter: Secondary | ICD-10-CM

## 2020-09-25 DIAGNOSIS — S0990XA Unspecified injury of head, initial encounter: Secondary | ICD-10-CM | POA: Diagnosis present

## 2020-09-25 NOTE — ED Triage Notes (Signed)
Pt reports she was working in the garden and fell backwards and hit her head on the ground. Denies LOC. C/o headache. Pt is on eliquis

## 2020-09-25 NOTE — ED Provider Notes (Signed)
[facility] EMERGENCY DEPARTMENT Provider Note   CSN: [id number] Arrival date & time: 09/25/20  1908     History Chief Complaint  Patient presents with  . Fall    Suzanne Greer is a 70 y.o. female.  The history is provided by the patient and medical records.   Suzanne Greer is a 70 y.o. female who presents to the Emergency Department complaining of fall. About 6 PM she was working in her garden when she fell backwards, striking her head on the ground. She did land on her bottom prior to striking her head. She does take eliquis for history of a fib. She complains of headache, neck pain. She denies any nausea, vomiting, chest pain, back pain, new weakness. She did require assistance to get up off the ground but has been able to ambulate at her baseline since the fall. She does live alone. Symptoms are moderate in nature. She was ill about one month ago with bronchitis and treated with a course of Cefdinir, states that she feels improved since that treatment.    Past Medical History:  Diagnosis Date  . Atrial fibrillation ([place])   . Back pain   . Diabetes mellitus   . Dysrhythmia    history Atrial Flutter. -Dr, Chiu-cardiology -[place] [facility] follows  . Fibromyalgia   . GERD (gastroesophageal reflux disease)   . Hip pain, bilateral   . Hx of seasonal allergies   . Hyperlipidemia   . Hypertension   . Insomnia   . 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 Obesity   . Restless legs   . Sleep apnea    no cpap use now-unable to tolerate mask, surgery a yr ago  . Vertigo     Patient Active Problem List   Diagnosis Date Noted  . OSA (obstructive sleep apnea) 06/14/2018  . Atrial fibrillation ([place])   . Obesity   . Type 2 diabetes mellitus with complication ([place])   . Fibromyalgia   . Hypertension   . GERD (gastroesophageal reflux disease)   . Insomnia   . Meniere disease   . Vertigo     Past Surgical History:  Procedure  Laterality Date  . APPENDECTOMY    . BUNIONECTOMY    . ESOPHAGOGASTRODUODENOSCOPY (EGD) WITH PROPOFOL N/A 08/16/2015   Procedure: ESOPHAGOGASTRODUODENOSCOPY (EGD) WITH PROPOFOL;  Surgeon: Garlan Fair, MD;  Location: [facility];  Service: Endoscopy;  Laterality: N/A;  . GANGLION CYST   1980   REMOVED   . HEMORROIDECTOMY    . THYROIDECTOMY, PARTIAL  2005   "partial for growth-benign"  . UPPER ENDOSCOPIC ULTRASOUND W/ FNA    . UVULECTOMY     excised about a yr ago [facility]     OB History   No obstetric history on file.     Family History  Problem Relation Age of Onset  . Cancer Mother 45       PERITONEAL CANCER  . Heart disease Mother   . Diabetes Father        unsure of complete medical hx - not seen since her 30s  . Hepatitis C Brother   . Drug abuse Brother     Social History   Tobacco Use  . Smoking status: Never Smoker  . Smokeless tobacco: Never Used  Substance Use Topics  . Alcohol use: No  . Drug use: No    Home Medications Prior to Admission medications   Medication Sig  Start Date End Date Taking? Authorizing Provider  apixaban (ELIQUIS) 5 MG TABS tablet Take 1 tablet (5 mg total) by mouth 2 (two) times daily. 09/22/20   Martinique, Peter M, MD  atorvastatin (LIPITOR) 20 MG tablet Take 20 mg by mouth daily. 08/03/15   [provider]  cefdinir (OMNICEF) 300 MG capsule Take 300 mg by mouth 2 (two) times daily. 08/30/20   [provider]  cyclobenzaprine (FLEXERIL) 10 MG tablet Take one tablet at bedtime. 02/11/20   Sater, Nanine Means, MD  diltiazem (CARDIZEM CD) 120 MG 24 hr capsule Take 1 capsule (120 mg total) by mouth daily. 09/17/20 12/16/20  Martinique, Peter M, MD  fluticasone (FLONASE) 50 MCG/ACT nasal spray Place 2 sprays into both nostrils daily.    [provider]  Magnesium 400 MG TABS Take 400 mg by mouth daily.     [provider]  Melatonin 10 MG TABS Take 10 mg by mouth at bedtime.     [provider]   metFORMIN (GLUCOPHAGE-XR) 500 MG 24 hr tablet Take 1,000 mg by mouth 2 (two) times daily.     [provider]  metoprolol succinate (TOPROL-XL) 50 MG 24 hr tablet Take 1 tablet (50 mg total) by mouth daily. 09/09/20   Martinique, Peter M, MD  montelukast (SINGULAIR) 10 MG tablet Take 10 mg by mouth at bedtime.    [provider]  omeprazole (PRILOSEC) 40 MG capsule omeprazole 40 mg capsule,delayed release    [provider]  propafenone (RYTHMOL) 300 MG tablet Take 1 tablet (300 mg total) by mouth every 8 (eight) hours. 09/09/20   Martinique, Peter M, MD  traMADol (ULTRAM) 50 MG tablet Take 50 mg by mouth 3 (three) times daily as needed (Pain).     [provider]  VITAMIN D PO Take 1 tablet by mouth daily.    [provider]    Allergies    Doxycycline, Yeast-related products, Molds & smuts, Penicillins, and Sulfa drugs cross reactors  Review of Systems   Review of Systems  All other systems reviewed and are negative.   Physical Exam Updated Vital Signs BP (!) 143/63 (BP Location: Left Arm)   Pulse 80   Temp 98.4 F (36.9 C) (Oral)   Resp 18   Ht 5\' 5"  (1.651 m)   Wt 98.4 kg   SpO2 99%   BMI 36.11 kg/m   Physical Exam Vitals and nursing note reviewed.  Constitutional:      Appearance: She is well-developed.  HENT:     Head: Normocephalic and atraumatic.  Cardiovascular:     Rate and Rhythm: Normal rate and regular rhythm.     Heart sounds: No murmur heard.   Pulmonary:     Effort: Pulmonary effort is normal. No respiratory distress.     Breath sounds: Normal breath sounds.  Abdominal:     Palpations: Abdomen is soft.     Tenderness: There is no abdominal tenderness. There is no guarding or rebound.  Musculoskeletal:        General: No tenderness.     Cervical back: Neck supple. No tenderness.  Skin:    General: Skin is warm and dry.  Neurological:     Mental Status: She is alert and oriented to person, place, and time.      Comments: 5/5 strength in all four extremities.   Psychiatric:        Behavior: Behavior normal.     ED Results / Procedures / Treatments  Labs (all labs ordered are listed, but only abnormal results are displayed) Labs Reviewed - No data to display  EKG None  Radiology CT Head Wo Contrast  Result Date: 09/25/2020 CLINICAL DATA:  Fall.  Headache. EXAM: CT HEAD WITHOUT CONTRAST CT CERVICAL SPINE WITHOUT CONTRAST TECHNIQUE: Multidetector CT imaging of the head and cervical spine was performed following the standard protocol without intravenous contrast. Multiplanar CT image reconstructions of the cervical spine were also generated. COMPARISON:  None. FINDINGS: CT HEAD FINDINGS Brain: There is no mass, hemorrhage or extra-axial collection. The size and configuration of the ventricles and extra-axial CSF spaces are normal. The brain parenchyma is normal, without evidence of acute or chronic infarction. Vascular: No abnormal hyperdensity of the major intracranial arteries or dural venous sinuses. No intracranial atherosclerosis. Skull: The visualized skull base, calvarium and extracranial soft tissues are normal. Sinuses/Orbits: No fluid levels or advanced mucosal thickening of the visualized paranasal sinuses. No mastoid or middle ear effusion. The orbits are normal. CT CERVICAL SPINE FINDINGS Alignment: No static subluxation. Facets are aligned. Occipital condyles are normally positioned. Skull base and vertebrae: No acute fracture. Soft tissues and spinal canal: No prevertebral fluid or swelling. No visible canal hematoma. Disc levels: No advanced spinal canal or neural foraminal stenosis. Upper chest: No pneumothorax, pulmonary nodule or pleural effusion. Other: Normal visualized paraspinal cervical soft tissues. IMPRESSION: 1. No acute intracranial abnormality. 2. No acute fracture or static subluxation of the cervical spine. Electronically Signed   By: Ulyses Jarred M.D.   On: 09/25/2020 21:27    CT Cervical Spine Wo Contrast  Result Date: 09/25/2020 CLINICAL DATA:  Fall.  Headache. EXAM: CT HEAD WITHOUT CONTRAST CT CERVICAL SPINE WITHOUT CONTRAST TECHNIQUE: Multidetector CT imaging of the head and cervical spine was performed following the standard protocol without intravenous contrast. Multiplanar CT image reconstructions of the cervical spine were also generated. COMPARISON:  None. FINDINGS: CT HEAD FINDINGS Brain: There is no mass, hemorrhage or extra-axial collection. The size and configuration of the ventricles and extra-axial CSF spaces are normal. The brain parenchyma is normal, without evidence of acute or chronic infarction. Vascular: No abnormal hyperdensity of the major intracranial arteries or dural venous sinuses. No intracranial atherosclerosis. Skull: The visualized skull base, calvarium and extracranial soft tissues are normal. Sinuses/Orbits: No fluid levels or advanced mucosal thickening of the visualized paranasal sinuses. No mastoid or middle ear effusion. The orbits are normal. CT CERVICAL SPINE FINDINGS Alignment: No static subluxation. Facets are aligned. Occipital condyles are normally positioned. Skull base and vertebrae: No acute fracture. Soft tissues and spinal canal: No prevertebral fluid or swelling. No visible canal hematoma. Disc levels: No advanced spinal canal or neural foraminal stenosis. Upper chest: No pneumothorax, pulmonary nodule or pleural effusion. Other: Normal visualized paraspinal cervical soft tissues. IMPRESSION: 1. No acute intracranial abnormality. 2. No acute fracture or static subluxation of the cervical spine. Electronically Signed   By: Ulyses Jarred M.D.   On: 09/25/2020 21:27    Procedures Procedures   Medications Ordered in ED Medications - No data to display  ED Course  I have reviewed the triage vital signs and the nursing notes.  Pertinent labs & imaging results that were available during my care of the patient were reviewed by me  and considered in my medical decision making (see chart for details).    MDM Rules/Calculators/A&P  patient here for evaluation of injuries following a mechanical fall. She is anticoagulated for history of a fib. She does have pain to the back of her head with no significant soft tissue swelling. Given her anticoagulation CT scan was obtained. CT is negative for acute intracranial abnormality. Given fallen report of neck pain a CT neck was obtained as well, which is negative for acute abnormality. Discussed with patient home care following fall. Discussed Tylenol prn, rest. Plan to discharge home with outpatient follow-up and return precautions.  Final Clinical Impression(s) / ED Diagnoses Final diagnoses:  Fall, initial encounter  Contusion of scalp, initial encounter    Rx / DC Orders ED Discharge Orders    None       Quintella Reichert, MD 09/25/20 2155

## 2020-09-27 DIAGNOSIS — K219 Gastro-esophageal reflux disease without esophagitis: Secondary | ICD-10-CM | POA: Diagnosis not present

## 2020-09-27 DIAGNOSIS — G4700 Insomnia, unspecified: Secondary | ICD-10-CM | POA: Diagnosis not present

## 2020-09-27 DIAGNOSIS — E1140 Type 2 diabetes mellitus with diabetic neuropathy, unspecified: Secondary | ICD-10-CM | POA: Diagnosis not present

## 2020-09-27 DIAGNOSIS — I4891 Unspecified atrial fibrillation: Secondary | ICD-10-CM | POA: Diagnosis not present

## 2020-09-27 DIAGNOSIS — F331 Major depressive disorder, recurrent, moderate: Secondary | ICD-10-CM | POA: Diagnosis not present

## 2020-09-27 DIAGNOSIS — J45909 Unspecified asthma, uncomplicated: Secondary | ICD-10-CM | POA: Diagnosis not present

## 2020-09-27 DIAGNOSIS — E1159 Type 2 diabetes mellitus with other circulatory complications: Secondary | ICD-10-CM | POA: Diagnosis not present

## 2020-09-27 DIAGNOSIS — E7800 Pure hypercholesterolemia, unspecified: Secondary | ICD-10-CM | POA: Diagnosis not present

## 2020-09-27 DIAGNOSIS — G8929 Other chronic pain: Secondary | ICD-10-CM | POA: Diagnosis not present

## 2020-09-28 DIAGNOSIS — E119 Type 2 diabetes mellitus without complications: Secondary | ICD-10-CM | POA: Diagnosis not present

## 2020-09-28 DIAGNOSIS — Z961 Presence of intraocular lens: Secondary | ICD-10-CM | POA: Diagnosis not present

## 2020-09-28 DIAGNOSIS — H353131 Nonexudative age-related macular degeneration, bilateral, early dry stage: Secondary | ICD-10-CM | POA: Diagnosis not present

## 2020-09-28 DIAGNOSIS — H43811 Vitreous degeneration, right eye: Secondary | ICD-10-CM | POA: Diagnosis not present

## 2020-10-04 ENCOUNTER — Other Ambulatory Visit: Payer: Self-pay

## 2020-10-04 ENCOUNTER — Encounter: Payer: Medicare Other | Attending: Family Medicine | Admitting: Registered"

## 2020-10-04 ENCOUNTER — Encounter: Payer: Self-pay | Admitting: Registered"

## 2020-10-04 DIAGNOSIS — E118 Type 2 diabetes mellitus with unspecified complications: Secondary | ICD-10-CM | POA: Insufficient documentation

## 2020-10-04 NOTE — Patient Instructions (Signed)
Take medication at regular times Use your calendar reminder option to give you notifications, put your Ozempic in your reminders Scan your Libre at least 3 times per day (waking, bedtime, before meals, 2 hrs hours after meals) Daily Food log As soon as you are able increase your activity Consider have a little less fat in your meals so your blood sugar will come down sooner after eating Take your own snacks to your card games

## 2020-10-04 NOTE — Progress Notes (Signed)
Diabetes Self-Management Education  Visit Type: Follow-up  Appt. Start Time: 1600 Appt. End Time: 1630  10/04/2020  [name], identified by name and date of birth, is a 70 y.o. female with a diagnosis of Diabetes: Type 2.   ASSESSMENT  There were no vitals taken for this visit. There is no height or weight on file to calculate BMI.  Pt states she plans to start Ozempic tonight, is still taking Metformin 500 mg daily. Pt states due to continued back pain she recently had MRI, L4 & L5 narrowed patient reports she can't stand very long, next week shot in back, lying on heating pad helps.  Per chart patient had ED visit on 09/25/20, pt reports she fell and MD advised that she go to ER due to blood thinner medication she is taking.  Patient reports due to pain she has not had much exercise gut she plans to start aquatherapy and after she sees some improvement plans to do physical therapy again.    Diabetes Self-Management Education - 10/04/20 1500      Visit Information   Visit Type Follow-up      Initial Visit   Diabetes Type Type 2      Complications   How often do you check your blood sugar? > 4 times/day   CGM     Dietary Intake   Breakfast light yogurt    Snack (morning) pice of a chunky bar    Lunch french toast, 3 slices bacon, 2 eggs    Dinner ham, scalloped potoatoes, asparagus, corn souffle, cheese cake, water   Easter dinner   Beverage(s) body armour low cal, water, lemonade      Exercise   Exercise Type ADL's   limited due to back pain     Patient Education   Monitoring Other (comment)   interpreting CGM data     Individualized Goals (developed by patient)   Medications take my medication as prescribed    Monitoring  test my blood glucose as discussed      Outcomes   Expected Outcomes Demonstrated interest in learning. Expect positive outcomes    Future DMSE 4-6 wks    Program Status Not Completed      Subsequent Visit   Since your last visit  have you continued or begun to take your medications as prescribed? Yes    Since your last visit, are you checking your blood glucose at least once a day? Yes           Individualized Plan for Diabetes Self-Management Training:   Learning Objective:  Patient will have a greater understanding of diabetes self-management. Patient education plan is to attend individual and/or group sessions per assessed needs and concerns.   Patient Instructions  Take medication at regular times Use your calendar reminder option to give you notifications, put your Ozempic in your reminders Scan your Libre at least 3 times per day (waking, bedtime, before meals, 2 hrs hours after meals) Daily Food log As soon as you are able increase your activity Consider have a little less fat in your meals so your blood sugar will come down sooner after eating Take your own snacks to your card games   Expected Outcomes:  Demonstrated interest in learning. Expect positive outcomes  Education material provided: none  If problems or questions, patient to contact team via:  Phone and MyChart  Future DSME appointment: 4-6 wks

## 2020-10-12 DIAGNOSIS — M47816 Spondylosis without myelopathy or radiculopathy, lumbar region: Secondary | ICD-10-CM | POA: Diagnosis not present

## 2020-10-25 DIAGNOSIS — J45909 Unspecified asthma, uncomplicated: Secondary | ICD-10-CM | POA: Diagnosis not present

## 2020-10-25 DIAGNOSIS — E1140 Type 2 diabetes mellitus with diabetic neuropathy, unspecified: Secondary | ICD-10-CM | POA: Diagnosis not present

## 2020-10-25 DIAGNOSIS — E7800 Pure hypercholesterolemia, unspecified: Secondary | ICD-10-CM | POA: Diagnosis not present

## 2020-10-25 DIAGNOSIS — I4891 Unspecified atrial fibrillation: Secondary | ICD-10-CM | POA: Diagnosis not present

## 2020-10-25 DIAGNOSIS — E1149 Type 2 diabetes mellitus with other diabetic neurological complication: Secondary | ICD-10-CM | POA: Diagnosis not present

## 2020-10-25 DIAGNOSIS — G4700 Insomnia, unspecified: Secondary | ICD-10-CM | POA: Diagnosis not present

## 2020-10-25 DIAGNOSIS — E1159 Type 2 diabetes mellitus with other circulatory complications: Secondary | ICD-10-CM | POA: Diagnosis not present

## 2020-10-25 DIAGNOSIS — G8929 Other chronic pain: Secondary | ICD-10-CM | POA: Diagnosis not present

## 2020-10-25 DIAGNOSIS — F331 Major depressive disorder, recurrent, moderate: Secondary | ICD-10-CM | POA: Diagnosis not present

## 2020-10-25 DIAGNOSIS — K219 Gastro-esophageal reflux disease without esophagitis: Secondary | ICD-10-CM | POA: Diagnosis not present

## 2020-10-25 DIAGNOSIS — E119 Type 2 diabetes mellitus without complications: Secondary | ICD-10-CM | POA: Diagnosis not present

## 2020-10-28 DIAGNOSIS — M5451 Vertebrogenic low back pain: Secondary | ICD-10-CM | POA: Diagnosis not present

## 2020-11-11 DIAGNOSIS — M545 Low back pain, unspecified: Secondary | ICD-10-CM | POA: Diagnosis not present

## 2020-11-11 DIAGNOSIS — M5416 Radiculopathy, lumbar region: Secondary | ICD-10-CM | POA: Diagnosis not present

## 2020-11-22 ENCOUNTER — Encounter: Payer: Self-pay | Admitting: Registered"

## 2020-11-22 ENCOUNTER — Other Ambulatory Visit: Payer: Self-pay

## 2020-11-22 ENCOUNTER — Encounter: Payer: Medicare Other | Attending: Family Medicine | Admitting: Registered"

## 2020-11-22 VITALS — Wt 220.4 lb

## 2020-11-22 DIAGNOSIS — E118 Type 2 diabetes mellitus with unspecified complications: Secondary | ICD-10-CM | POA: Diagnosis not present

## 2020-11-22 NOTE — Patient Instructions (Addendum)
Great job on stocking up on foods that are easy to prepare so you can cook more at home instead of eating out Drink at least 1 cup of water when you wake up. Consider eating less cottage cheese due to sodium content Getting more physical activity is also a great plan you have with your aquatherapy.  You can leave your Ozempic out to help you remember to take it.

## 2020-11-22 NOTE — Progress Notes (Signed)
Diabetes Self-Management Education  Visit Type: Follow-up  Appt. Start Time: 0930 Appt. End Time: 1000  11/22/2020  [name], identified by name and date of birth, is a 70 y.o. female with a diagnosis of Diabetes: Type 2.   ASSESSMENT  Weight 220 lb 6.4 oz (100 kg). Body mass index is 36.68 kg/m.  Pt states she used Ozempic for 2 weeks, but did it wrong and didn't titrate amount. Pt states she will start again today but states having to keep in refrigerator makes it difficult to remember to use. Pt states she doesn't want to take it if her blood sugar is going to come down without it. Pt scanned her Suzanne Greer in appointment. 215 mg/dL 1 hour after having 1 c cottage cheese and a banana.  Pt reports she starts aquatherapy today and plans to go to a pool to continue her doing her therapy exercise.  Pt states she has been having disappointing, unsatisfying meals that she gets at restaurants. Pt states she bought some fruits, vegetables, hotdogs, frozen fish, cottage cheese and other things to start preparing more meals at home.           Diabetes Self-Management Education - 11/22/20 0900      Visit Information   Visit Type Follow-up      Initial Visit   Diabetes Type Type 2      Complications   How often do you check your blood sugar? > 4 times/day      Dietary Intake   Breakfast 1 c cottage cheese, fruit    Dinner stew, rice    Snack (evening) 1 c ice cream    Beverage(s) water,      Patient Education   Medications Other (comment)   action of Ozempic     Individualized Goals (developed by patient)   Nutrition Other (comment)   cook more at home   Physical Activity Exercise 3-5 times per week    Medications take my medication as prescribed      Outcomes   Expected Outcomes Demonstrated interest in learning. Expect positive outcomes    Future DMSE 4-6 wks    Program Status Not Completed      Subsequent Visit   Since your last visit have you continued or  begun to take your medications as prescribed? No   will start ozempic this week   Since your last visit, are you checking your blood glucose at least once a day? Yes           Individualized Plan for Diabetes Self-Management Training:   Learning Objective:  Patient will have a greater understanding of diabetes self-management. Patient education plan is to attend individual and/or group sessions per assessed needs and concerns.    Patient Instructions  Great job on stocking up on foods that are easy to prepare so you can cook more at home instead of eating out Drink at least 1 cup of water when you wake up. Consider eating less cottage cheese due to sodium content Getting more physical activity is also a great plan you have with your aquatherapy.  You can leave your Ozempic out to help you remember to take it.   Expected Outcomes:  Demonstrated interest in learning. Expect positive outcomes  Education material provided: diabetes medications  If problems or questions, patient to contact team via:  Phone and MyChart  Future DSME appointment: 4-6 wks

## 2020-11-23 DIAGNOSIS — M545 Low back pain, unspecified: Secondary | ICD-10-CM | POA: Diagnosis not present

## 2020-11-23 DIAGNOSIS — M5416 Radiculopathy, lumbar region: Secondary | ICD-10-CM | POA: Diagnosis not present

## 2020-11-25 DIAGNOSIS — N870 Mild cervical dysplasia: Secondary | ICD-10-CM | POA: Diagnosis not present

## 2020-11-25 DIAGNOSIS — B977 Papillomavirus as the cause of diseases classified elsewhere: Secondary | ICD-10-CM | POA: Diagnosis not present

## 2020-11-26 DIAGNOSIS — M545 Low back pain, unspecified: Secondary | ICD-10-CM | POA: Diagnosis not present

## 2020-11-26 DIAGNOSIS — M5416 Radiculopathy, lumbar region: Secondary | ICD-10-CM | POA: Diagnosis not present

## 2020-11-30 DIAGNOSIS — M545 Low back pain, unspecified: Secondary | ICD-10-CM | POA: Diagnosis not present

## 2020-11-30 DIAGNOSIS — M5416 Radiculopathy, lumbar region: Secondary | ICD-10-CM | POA: Diagnosis not present

## 2020-12-01 DIAGNOSIS — B351 Tinea unguium: Secondary | ICD-10-CM | POA: Diagnosis not present

## 2020-12-01 DIAGNOSIS — E119 Type 2 diabetes mellitus without complications: Secondary | ICD-10-CM | POA: Diagnosis not present

## 2020-12-01 DIAGNOSIS — L89892 Pressure ulcer of other site, stage 2: Secondary | ICD-10-CM | POA: Diagnosis not present

## 2020-12-01 DIAGNOSIS — E11621 Type 2 diabetes mellitus with foot ulcer: Secondary | ICD-10-CM | POA: Diagnosis not present

## 2020-12-01 DIAGNOSIS — M79671 Pain in right foot: Secondary | ICD-10-CM | POA: Diagnosis not present

## 2020-12-01 DIAGNOSIS — M79672 Pain in left foot: Secondary | ICD-10-CM | POA: Diagnosis not present

## 2020-12-07 DIAGNOSIS — M5416 Radiculopathy, lumbar region: Secondary | ICD-10-CM | POA: Diagnosis not present

## 2020-12-07 DIAGNOSIS — M545 Low back pain, unspecified: Secondary | ICD-10-CM | POA: Diagnosis not present

## 2020-12-09 DIAGNOSIS — M5451 Vertebrogenic low back pain: Secondary | ICD-10-CM | POA: Diagnosis not present

## 2020-12-09 DIAGNOSIS — M545 Low back pain, unspecified: Secondary | ICD-10-CM | POA: Diagnosis not present

## 2020-12-09 DIAGNOSIS — M5416 Radiculopathy, lumbar region: Secondary | ICD-10-CM | POA: Diagnosis not present

## 2020-12-13 DIAGNOSIS — M5416 Radiculopathy, lumbar region: Secondary | ICD-10-CM | POA: Diagnosis not present

## 2020-12-13 DIAGNOSIS — M545 Low back pain, unspecified: Secondary | ICD-10-CM | POA: Diagnosis not present

## 2020-12-15 DIAGNOSIS — M5416 Radiculopathy, lumbar region: Secondary | ICD-10-CM | POA: Diagnosis not present

## 2020-12-15 DIAGNOSIS — M545 Low back pain, unspecified: Secondary | ICD-10-CM | POA: Diagnosis not present

## 2020-12-23 DIAGNOSIS — E7800 Pure hypercholesterolemia, unspecified: Secondary | ICD-10-CM | POA: Diagnosis not present

## 2020-12-23 DIAGNOSIS — G8929 Other chronic pain: Secondary | ICD-10-CM | POA: Diagnosis not present

## 2020-12-23 DIAGNOSIS — G4700 Insomnia, unspecified: Secondary | ICD-10-CM | POA: Diagnosis not present

## 2020-12-23 DIAGNOSIS — E119 Type 2 diabetes mellitus without complications: Secondary | ICD-10-CM | POA: Diagnosis not present

## 2020-12-23 DIAGNOSIS — E1159 Type 2 diabetes mellitus with other circulatory complications: Secondary | ICD-10-CM | POA: Diagnosis not present

## 2020-12-23 DIAGNOSIS — E1149 Type 2 diabetes mellitus with other diabetic neurological complication: Secondary | ICD-10-CM | POA: Diagnosis not present

## 2020-12-23 DIAGNOSIS — H259 Unspecified age-related cataract: Secondary | ICD-10-CM | POA: Diagnosis not present

## 2020-12-23 DIAGNOSIS — E1140 Type 2 diabetes mellitus with diabetic neuropathy, unspecified: Secondary | ICD-10-CM | POA: Diagnosis not present

## 2020-12-23 DIAGNOSIS — F331 Major depressive disorder, recurrent, moderate: Secondary | ICD-10-CM | POA: Diagnosis not present

## 2020-12-23 DIAGNOSIS — I4891 Unspecified atrial fibrillation: Secondary | ICD-10-CM | POA: Diagnosis not present

## 2020-12-23 DIAGNOSIS — K219 Gastro-esophageal reflux disease without esophagitis: Secondary | ICD-10-CM | POA: Diagnosis not present

## 2020-12-23 DIAGNOSIS — J45909 Unspecified asthma, uncomplicated: Secondary | ICD-10-CM | POA: Diagnosis not present

## 2020-12-27 ENCOUNTER — Other Ambulatory Visit: Payer: Self-pay | Admitting: Neurology

## 2020-12-28 DIAGNOSIS — M545 Low back pain, unspecified: Secondary | ICD-10-CM | POA: Diagnosis not present

## 2020-12-28 DIAGNOSIS — M5416 Radiculopathy, lumbar region: Secondary | ICD-10-CM | POA: Diagnosis not present

## 2020-12-30 DIAGNOSIS — M5416 Radiculopathy, lumbar region: Secondary | ICD-10-CM | POA: Diagnosis not present

## 2020-12-30 DIAGNOSIS — M545 Low back pain, unspecified: Secondary | ICD-10-CM | POA: Diagnosis not present

## 2021-01-03 ENCOUNTER — Ambulatory Visit: Payer: Federal, State, Local not specified - PPO | Admitting: Registered"

## 2021-01-03 DIAGNOSIS — E119 Type 2 diabetes mellitus without complications: Secondary | ICD-10-CM | POA: Diagnosis not present

## 2021-01-03 DIAGNOSIS — I4891 Unspecified atrial fibrillation: Secondary | ICD-10-CM | POA: Diagnosis not present

## 2021-01-03 DIAGNOSIS — G4733 Obstructive sleep apnea (adult) (pediatric): Secondary | ICD-10-CM | POA: Diagnosis not present

## 2021-01-03 DIAGNOSIS — E669 Obesity, unspecified: Secondary | ICD-10-CM | POA: Diagnosis not present

## 2021-01-04 DIAGNOSIS — E7800 Pure hypercholesterolemia, unspecified: Secondary | ICD-10-CM | POA: Diagnosis not present

## 2021-01-04 DIAGNOSIS — K219 Gastro-esophageal reflux disease without esophagitis: Secondary | ICD-10-CM | POA: Diagnosis not present

## 2021-01-04 DIAGNOSIS — F331 Major depressive disorder, recurrent, moderate: Secondary | ICD-10-CM | POA: Diagnosis not present

## 2021-01-04 DIAGNOSIS — G4700 Insomnia, unspecified: Secondary | ICD-10-CM | POA: Diagnosis not present

## 2021-01-04 DIAGNOSIS — J45909 Unspecified asthma, uncomplicated: Secondary | ICD-10-CM | POA: Diagnosis not present

## 2021-01-04 DIAGNOSIS — E1159 Type 2 diabetes mellitus with other circulatory complications: Secondary | ICD-10-CM | POA: Diagnosis not present

## 2021-01-04 DIAGNOSIS — E1140 Type 2 diabetes mellitus with diabetic neuropathy, unspecified: Secondary | ICD-10-CM | POA: Diagnosis not present

## 2021-01-04 DIAGNOSIS — G8929 Other chronic pain: Secondary | ICD-10-CM | POA: Diagnosis not present

## 2021-01-04 DIAGNOSIS — I4891 Unspecified atrial fibrillation: Secondary | ICD-10-CM | POA: Diagnosis not present

## 2021-01-11 DIAGNOSIS — G4700 Insomnia, unspecified: Secondary | ICD-10-CM | POA: Diagnosis not present

## 2021-01-11 DIAGNOSIS — E1159 Type 2 diabetes mellitus with other circulatory complications: Secondary | ICD-10-CM | POA: Diagnosis not present

## 2021-01-11 DIAGNOSIS — F331 Major depressive disorder, recurrent, moderate: Secondary | ICD-10-CM | POA: Diagnosis not present

## 2021-01-11 DIAGNOSIS — J45909 Unspecified asthma, uncomplicated: Secondary | ICD-10-CM | POA: Diagnosis not present

## 2021-01-11 DIAGNOSIS — E7800 Pure hypercholesterolemia, unspecified: Secondary | ICD-10-CM | POA: Diagnosis not present

## 2021-01-11 DIAGNOSIS — E1140 Type 2 diabetes mellitus with diabetic neuropathy, unspecified: Secondary | ICD-10-CM | POA: Diagnosis not present

## 2021-01-11 DIAGNOSIS — K219 Gastro-esophageal reflux disease without esophagitis: Secondary | ICD-10-CM | POA: Diagnosis not present

## 2021-01-11 DIAGNOSIS — I4891 Unspecified atrial fibrillation: Secondary | ICD-10-CM | POA: Diagnosis not present

## 2021-01-11 DIAGNOSIS — G8929 Other chronic pain: Secondary | ICD-10-CM | POA: Diagnosis not present

## 2021-01-15 ENCOUNTER — Other Ambulatory Visit: Payer: Self-pay | Admitting: Neurology

## 2021-01-23 ENCOUNTER — Other Ambulatory Visit: Payer: Self-pay | Admitting: Neurology

## 2021-01-24 DIAGNOSIS — Z20822 Contact with and (suspected) exposure to covid-19: Secondary | ICD-10-CM | POA: Diagnosis not present

## 2021-01-28 DIAGNOSIS — M79641 Pain in right hand: Secondary | ICD-10-CM | POA: Diagnosis not present

## 2021-01-31 ENCOUNTER — Encounter: Payer: Self-pay | Admitting: Registered"

## 2021-01-31 ENCOUNTER — Encounter: Payer: Medicare Other | Attending: Family Medicine | Admitting: Registered"

## 2021-01-31 ENCOUNTER — Other Ambulatory Visit: Payer: Self-pay

## 2021-01-31 DIAGNOSIS — E118 Type 2 diabetes mellitus with unspecified complications: Secondary | ICD-10-CM | POA: Insufficient documentation

## 2021-01-31 NOTE — Patient Instructions (Signed)
Continue looking for easy protein foods you enjoy to include with fruit. Continue aiming to eat more vegetables. Tonight cook the spaghetti squash Consider looking into the Beyond Type 2 website  Write down what you eat while in [place] and find a Social worker, can use list for resources

## 2021-01-31 NOTE — Progress Notes (Addendum)
Diabetes Self-Management Education  Visit Type: Follow-up  Appt. Start Time: 0805 Appt. End Time: [id number]  01/31/2021  Suzanne Greer, identified by name and date of birth, is a 70 y.o. female with a diagnosis of Diabetes: Type 2.   ASSESSMENT  There were no vitals taken for this visit. There is no height or weight on file to calculate BMI.  Patient states she will be leaving for [place] this week to stay with a friend for 2 weeks. Pt reports her friend is motivated to make better food choices and will be preparing fish. Pt states she will keep a food log to help her remember what foods she likes and can replicate at home.  Pt reports that she is feeling defeated/in denial about her diabetes and feels she would benefit from working with a Social worker. Pt reports she has good intentions and will buy foods that she feels are healthy choices but then doesn't eat it and ends up giving it away or throwing out a lot of food.  Pt states she has really been enjoying summer fruit. Pt states she has been eating some imitation crab to help get in more protein. Pt reports that a friend gave her a large container of jelly bellies and has had a hard time staying out of it.   Some recent [employer] data:    Diabetes Self-Management Education - 01/31/21 0900       Visit Information   Visit Type Follow-up      Initial Visit   Diabetes Type Type 2      Complications   How often do you check your blood sugar? > 4 times/day      Dietary Intake   Breakfast bagel, cream cheese    Snack (morning) fruit    Lunch cheerios, almond milk, blue berries OR sometimes fish sandwich at [employer] fresh tomato sandwich    Snack (evening) 20 calorie fruit/ice cream pops    Beverage(s) water      Patient Education   Nutrition management  Other (comment)   protein with snacks, increase vegetable intake   Psychosocial adjustment Helped patient identify a support system for diabetes management       Individualized Goals (developed by patient)   Nutrition Other (comment)   keep food log while on vacation   Health Coping discuss diabetes with (comment)   therapist     Patient Self-Evaluation of Goals - Patient rates self as meeting previously set goals (% of time)   Medications 50 - 75 %      Outcomes   Expected Outcomes Demonstrated interest in learning. Expect positive outcomes    Future DMSE 2 months    Program Status Not Completed      Subsequent Visit   Since your last visit have you continued or begun to take your medications as prescribed? No   still trying to come up with a routine   Since your last visit, are you checking your blood glucose at least once a day? Yes   CGM            Individualized Plan for Diabetes Self-Management Training:   Learning Objective:  Patient will have a greater understanding of diabetes self-management. Patient education plan is to attend individual and/or group sessions per assessed needs and concerns.   Patient Instructions  Continue looking for easy protein foods you enjoy to include with fruit. Continue aiming to eat more vegetables. Tonight cook the spaghetti squash Consider looking  into the Beyond Type 2 website  Write down what you eat while in [place] and find a counselor, can use list for resources  Expected Outcomes:  Demonstrated interest in learning. Expect positive outcomes  Education material provided: email sent with another resource for support groups and link to website for Beyond Type 2.  If problems or questions, patient to contact team via:  Phone and MyChart  Future DSME appointment: 2 months

## 2021-02-17 DIAGNOSIS — M5416 Radiculopathy, lumbar region: Secondary | ICD-10-CM | POA: Diagnosis not present

## 2021-02-17 DIAGNOSIS — M4696 Unspecified inflammatory spondylopathy, lumbar region: Secondary | ICD-10-CM | POA: Diagnosis not present

## 2021-02-23 DIAGNOSIS — E7800 Pure hypercholesterolemia, unspecified: Secondary | ICD-10-CM | POA: Diagnosis not present

## 2021-02-23 DIAGNOSIS — F331 Major depressive disorder, recurrent, moderate: Secondary | ICD-10-CM | POA: Diagnosis not present

## 2021-02-23 DIAGNOSIS — G8929 Other chronic pain: Secondary | ICD-10-CM | POA: Diagnosis not present

## 2021-02-23 DIAGNOSIS — I4891 Unspecified atrial fibrillation: Secondary | ICD-10-CM | POA: Diagnosis not present

## 2021-02-23 DIAGNOSIS — E1159 Type 2 diabetes mellitus with other circulatory complications: Secondary | ICD-10-CM | POA: Diagnosis not present

## 2021-02-23 DIAGNOSIS — E119 Type 2 diabetes mellitus without complications: Secondary | ICD-10-CM | POA: Diagnosis not present

## 2021-02-23 DIAGNOSIS — H259 Unspecified age-related cataract: Secondary | ICD-10-CM | POA: Diagnosis not present

## 2021-02-23 DIAGNOSIS — E1149 Type 2 diabetes mellitus with other diabetic neurological complication: Secondary | ICD-10-CM | POA: Diagnosis not present

## 2021-02-23 DIAGNOSIS — E1140 Type 2 diabetes mellitus with diabetic neuropathy, unspecified: Secondary | ICD-10-CM | POA: Diagnosis not present

## 2021-02-23 DIAGNOSIS — J45909 Unspecified asthma, uncomplicated: Secondary | ICD-10-CM | POA: Diagnosis not present

## 2021-02-23 DIAGNOSIS — K219 Gastro-esophageal reflux disease without esophagitis: Secondary | ICD-10-CM | POA: Diagnosis not present

## 2021-02-23 DIAGNOSIS — G4700 Insomnia, unspecified: Secondary | ICD-10-CM | POA: Diagnosis not present

## 2021-03-09 DIAGNOSIS — M25511 Pain in right shoulder: Secondary | ICD-10-CM | POA: Diagnosis not present

## 2021-03-15 ENCOUNTER — Other Ambulatory Visit: Payer: Self-pay | Admitting: Neurology

## 2021-03-23 DIAGNOSIS — I700 Atherosclerosis of aorta: Secondary | ICD-10-CM | POA: Diagnosis not present

## 2021-03-23 DIAGNOSIS — E1159 Type 2 diabetes mellitus with other circulatory complications: Secondary | ICD-10-CM | POA: Diagnosis not present

## 2021-03-23 DIAGNOSIS — E7800 Pure hypercholesterolemia, unspecified: Secondary | ICD-10-CM | POA: Diagnosis not present

## 2021-03-29 DIAGNOSIS — J45909 Unspecified asthma, uncomplicated: Secondary | ICD-10-CM | POA: Diagnosis not present

## 2021-03-29 DIAGNOSIS — E1159 Type 2 diabetes mellitus with other circulatory complications: Secondary | ICD-10-CM | POA: Diagnosis not present

## 2021-03-29 DIAGNOSIS — I4891 Unspecified atrial fibrillation: Secondary | ICD-10-CM | POA: Diagnosis not present

## 2021-03-29 DIAGNOSIS — H259 Unspecified age-related cataract: Secondary | ICD-10-CM | POA: Diagnosis not present

## 2021-03-29 DIAGNOSIS — R112 Nausea with vomiting, unspecified: Secondary | ICD-10-CM | POA: Diagnosis not present

## 2021-03-29 DIAGNOSIS — K219 Gastro-esophageal reflux disease without esophagitis: Secondary | ICD-10-CM | POA: Diagnosis not present

## 2021-03-29 DIAGNOSIS — G8929 Other chronic pain: Secondary | ICD-10-CM | POA: Diagnosis not present

## 2021-03-29 DIAGNOSIS — E7800 Pure hypercholesterolemia, unspecified: Secondary | ICD-10-CM | POA: Diagnosis not present

## 2021-03-29 DIAGNOSIS — F331 Major depressive disorder, recurrent, moderate: Secondary | ICD-10-CM | POA: Diagnosis not present

## 2021-03-29 DIAGNOSIS — R1013 Epigastric pain: Secondary | ICD-10-CM | POA: Diagnosis not present

## 2021-03-29 DIAGNOSIS — G4700 Insomnia, unspecified: Secondary | ICD-10-CM | POA: Diagnosis not present

## 2021-03-29 DIAGNOSIS — E1140 Type 2 diabetes mellitus with diabetic neuropathy, unspecified: Secondary | ICD-10-CM | POA: Diagnosis not present

## 2021-03-31 ENCOUNTER — Emergency Department (HOSPITAL_BASED_OUTPATIENT_CLINIC_OR_DEPARTMENT_OTHER): Payer: Medicare Other

## 2021-03-31 ENCOUNTER — Encounter (HOSPITAL_BASED_OUTPATIENT_CLINIC_OR_DEPARTMENT_OTHER): Payer: Self-pay | Admitting: Emergency Medicine

## 2021-03-31 ENCOUNTER — Other Ambulatory Visit: Payer: Self-pay

## 2021-03-31 ENCOUNTER — Emergency Department (HOSPITAL_BASED_OUTPATIENT_CLINIC_OR_DEPARTMENT_OTHER)
Admission: EM | Admit: 2021-03-31 | Discharge: 2021-03-31 | Disposition: A | Discharge status: Home / Self Care | Payer: Medicare Other | Source: Home / Self Care | Attending: Emergency Medicine | Admitting: Emergency Medicine

## 2021-03-31 DIAGNOSIS — R109 Unspecified abdominal pain: Secondary | ICD-10-CM | POA: Diagnosis not present

## 2021-03-31 DIAGNOSIS — E1136 Type 2 diabetes mellitus with diabetic cataract: Secondary | ICD-10-CM | POA: Diagnosis not present

## 2021-03-31 DIAGNOSIS — Z7901 Long term (current) use of anticoagulants: Secondary | ICD-10-CM | POA: Insufficient documentation

## 2021-03-31 DIAGNOSIS — G928 Other toxic encephalopathy: Secondary | ICD-10-CM | POA: Diagnosis not present

## 2021-03-31 DIAGNOSIS — R112 Nausea with vomiting, unspecified: Secondary | ICD-10-CM | POA: Diagnosis not present

## 2021-03-31 DIAGNOSIS — I10 Essential (primary) hypertension: Secondary | ICD-10-CM | POA: Insufficient documentation

## 2021-03-31 DIAGNOSIS — I4891 Unspecified atrial fibrillation: Secondary | ICD-10-CM | POA: Insufficient documentation

## 2021-03-31 DIAGNOSIS — E876 Hypokalemia: Secondary | ICD-10-CM | POA: Diagnosis not present

## 2021-03-31 DIAGNOSIS — G2581 Restless legs syndrome: Secondary | ICD-10-CM | POA: Diagnosis not present

## 2021-03-31 DIAGNOSIS — Z79899 Other long term (current) drug therapy: Secondary | ICD-10-CM | POA: Insufficient documentation

## 2021-03-31 DIAGNOSIS — Z7984 Long term (current) use of oral hypoglycemic drugs: Secondary | ICD-10-CM | POA: Insufficient documentation

## 2021-03-31 DIAGNOSIS — R569 Unspecified convulsions: Secondary | ICD-10-CM | POA: Diagnosis not present

## 2021-03-31 DIAGNOSIS — Z20822 Contact with and (suspected) exposure to covid-19: Secondary | ICD-10-CM | POA: Diagnosis not present

## 2021-03-31 DIAGNOSIS — I480 Paroxysmal atrial fibrillation: Secondary | ICD-10-CM | POA: Diagnosis not present

## 2021-03-31 DIAGNOSIS — E860 Dehydration: Secondary | ICD-10-CM | POA: Diagnosis not present

## 2021-03-31 DIAGNOSIS — E119 Type 2 diabetes mellitus without complications: Secondary | ICD-10-CM | POA: Insufficient documentation

## 2021-03-31 DIAGNOSIS — R1084 Generalized abdominal pain: Secondary | ICD-10-CM | POA: Insufficient documentation

## 2021-03-31 DIAGNOSIS — R1032 Left lower quadrant pain: Secondary | ICD-10-CM | POA: Diagnosis not present

## 2021-03-31 LAB — URINALYSIS, ROUTINE W REFLEX MICROSCOPIC
Bilirubin Urine: NEGATIVE
Glucose, UA: NEGATIVE mg/dL
Ketones, ur: NEGATIVE mg/dL
Nitrite: NEGATIVE
Protein, ur: NEGATIVE mg/dL
Specific Gravity, Urine: 1.018 (ref 1.005–1.030)
pH: 6 (ref 5.0–8.0)

## 2021-03-31 LAB — CBC
HCT: 39.2 % (ref 36.0–46.0)
Hemoglobin: 13.2 g/dL (ref 12.0–15.0)
MCH: 27.6 pg (ref 26.0–34.0)
MCHC: 33.7 g/dL (ref 30.0–36.0)
MCV: 81.8 fL (ref 80.0–100.0)
Platelets: 419 10*3/uL — ABNORMAL HIGH (ref 150–400)
RBC: 4.79 MIL/uL (ref 3.87–5.11)
RDW: 12.5 % (ref 11.5–15.5)
WBC: 14.4 10*3/uL — ABNORMAL HIGH (ref 4.0–10.5)
nRBC: 0 % (ref 0.0–0.2)

## 2021-03-31 LAB — COMPREHENSIVE METABOLIC PANEL
ALT: 13 U/L (ref 0–44)
AST: 18 U/L (ref 15–41)
Albumin: 4.3 g/dL (ref 3.5–5.0)
Alkaline Phosphatase: 82 U/L (ref 38–126)
Anion gap: 16 — ABNORMAL HIGH (ref 5–15)
BUN: 6 mg/dL — ABNORMAL LOW (ref 8–23)
CO2: 25 mmol/L (ref 22–32)
Calcium: 7.2 mg/dL — ABNORMAL LOW (ref 8.9–10.3)
Chloride: 97 mmol/L — ABNORMAL LOW (ref 98–111)
Creatinine, Ser: 0.83 mg/dL (ref 0.44–1.00)
GFR, Estimated: >60 mL/min (ref >60)
Glucose, Bld: 180 mg/dL — ABNORMAL HIGH (ref 70–99)
Potassium: 3.4 mmol/L — ABNORMAL LOW (ref 3.5–5.1)
Sodium: 138 mmol/L (ref 135–145)
Total Bilirubin: 0.5 mg/dL (ref 0.3–1.2)
Total Protein: 7.2 g/dL (ref 6.5–8.1)

## 2021-03-31 LAB — LIPASE, BLOOD: Lipase: 17 U/L (ref 11–51)

## 2021-03-31 MED ORDER — IOHEXOL 300 MG/ML  SOLN
100.0000 mL | Freq: Once | INTRAMUSCULAR | Status: AC | PRN
  Administered 2021-03-31: 100 mL via INTRAVENOUS

## 2021-03-31 MED ORDER — ONDANSETRON HCL 4 MG/2ML IJ SOLN
4.0000 mg | Freq: Once | INTRAMUSCULAR | Status: AC
  Administered 2021-03-31: 4 mg via INTRAVENOUS
  Filled 2021-03-31: qty 2

## 2021-03-31 MED ORDER — SODIUM CHLORIDE 0.9 % IV BOLUS
1000.0000 mL | Freq: Once | INTRAVENOUS | Status: AC
  Administered 2021-03-31: 1000 mL via INTRAVENOUS

## 2021-03-31 MED ORDER — FENTANYL CITRATE PF 50 MCG/ML IJ SOSY
50.0000 ug | PREFILLED_SYRINGE | Freq: Once | INTRAMUSCULAR | Status: AC
Start: 2021-03-31 — End: 2021-03-31
  Administered 2021-03-31: 50 ug via INTRAVENOUS
  Filled 2021-03-31: qty 1

## 2021-03-31 NOTE — ED Provider Notes (Signed)
[facility] EMERGENCY DEPT Provider Note   CSN: [id number] Arrival date & time: 03/31/21  1503     History Chief Complaint  Patient presents with   Abdominal Pain    Suzanne Greer is a 70 y.o. female.  Patient reports that after starting new diabetes medication, she started experiencing nausea, vomiting, diarrhea and abdominal pain.  Has since stopped taking this medication.  After discussing her symptoms with primary care office, she was recommended to come to ER for further evaluation.  Currently patient states that she is having some generalized abdominal pain, currently mild, no alleviating or aggravating factors.  Mild nausea as well.  No fevers.  HPI     Past Medical History:  Diagnosis Date   Atrial fibrillation ([place])    Back pain    Diabetes mellitus    Dysrhythmia    history Atrial Flutter. 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   Obesity    Restless legs    Sleep apnea    no cpap use now-unable to tolerate mask, surgery a yr ago   Vertigo     Patient Active Problem List   Diagnosis Date Noted   OSA (obstructive sleep apnea) 06/14/2018   Atrial fibrillation (HCC)    Obesity    Type 2 diabetes mellitus with complication (HCC)    Fibromyalgia    Hypertension    GERD (gastroesophageal reflux disease)    Insomnia    Meniere disease    Vertigo     Past Surgical History:  Procedure Laterality Date   APPENDECTOMY     BUNIONECTOMY     ESOPHAGOGASTRODUODENOSCOPY (EGD) WITH PROPOFOL N/A 08/16/2015   Procedure: ESOPHAGOGASTRODUODENOSCOPY (EGD) WITH PROPOFOL;  Surgeon: Garlan Fair, MD;  Location: [facility];  Service: Endoscopy;  Laterality: N/A;   GANGLION CYST   1980   REMOVED    HEMORROIDECTOMY      THYROIDECTOMY, PARTIAL  2005   "partial for growth-benign"   UPPER ENDOSCOPIC ULTRASOUND W/ FNA     UVULECTOMY     excised about a yr ago [facility]     OB History   No obstetric history on file.     Family History  Problem Relation Age of Onset   Cancer Mother 28       PERITONEAL CANCER   Heart disease Mother    Diabetes Father        unsure of complete medical hx - not seen since her 27s   Hepatitis C Brother    Drug abuse Brother     Social History   Tobacco Use   Smoking status: Never   Smokeless tobacco: Never  Substance Use Topics   Alcohol use: No   Drug use: No    Home Medications Prior to Admission medications   Medication Sig Start Date End Date Taking? Authorizing Provider  apixaban (ELIQUIS) 5 MG TABS tablet Take 1 tablet (5 mg total) by mouth 2 (two) times daily. 09/22/20   Martinique, Peter M, MD  atorvastatin (LIPITOR) 20 MG tablet Take 20 mg by mouth daily. 08/03/15   [provider]  cefdinir (OMNICEF) 300 MG capsule Take 300 mg by mouth 2 (two) times daily. 08/30/20  [provider]  cyclobenzaprine (FLEXERIL) 10 MG tablet TAKE 1 TABLET BY MOUTH EVERYDAY AT BEDTIME. Call [phone] to schedule yearly follow up for ongoing refills 01/17/21   Sater, Nanine Means, MD  diltiazem (CARDIZEM CD) 120 MG 24 hr capsule Take 1 capsule (120 mg total) by mouth daily. 09/17/20 12/16/20  Martinique, Peter M, MD  fluticasone (FLONASE) 50 MCG/ACT nasal spray Place 2 sprays into both nostrils daily.    [provider]  Magnesium 400 MG TABS Take 400 mg by mouth daily.     [provider]  Melatonin 10 MG TABS Take 10 mg by mouth at bedtime.     [provider]  metFORMIN (GLUCOPHAGE-XR) 500 MG 24 hr tablet Take 1,000 mg by mouth 2 (two) times daily.     [provider]  metoprolol succinate (TOPROL-XL) 50 MG 24 hr tablet Take 1 tablet (50 mg total) by mouth daily. 09/09/20   Martinique, Peter M, MD  montelukast (SINGULAIR) 10 MG  tablet Take 10 mg by mouth at bedtime.    [provider]  omeprazole (PRILOSEC) 40 MG capsule omeprazole 40 mg capsule,delayed release    [provider]  propafenone (RYTHMOL) 300 MG tablet Take 1 tablet (300 mg total) by mouth every 8 (eight) hours. 09/09/20   Martinique, Peter M, MD  Semaglutide (OZEMPIC, 0.25 OR 0.5 MG/DOSE, [place]) Inject into the skin.    [provider]  traMADol (ULTRAM) 50 MG tablet Take 50 mg by mouth 3 (three) times daily as needed (Pain).     [provider]  VITAMIN D PO Take 1 tablet by mouth daily.    [provider]    Allergies    Doxycycline, Yeast-related products, Molds & smuts, Penicillins, and Sulfa drugs cross reactors  Review of Systems   Review of Systems  Constitutional:  Positive for fatigue. Negative for chills and fever.  HENT:  Negative for ear pain and sore throat.   Eyes:  Negative for pain and visual disturbance.  Respiratory:  Negative for cough and shortness of breath.   Cardiovascular:  Negative for chest pain and palpitations.  Gastrointestinal:  Positive for abdominal pain, nausea and vomiting.  Genitourinary:  Negative for dysuria and hematuria.  Musculoskeletal:  Negative for arthralgias and back pain.  Skin:  Negative for color change and rash.  Neurological:  Negative for seizures and syncope.  All other systems reviewed and are negative.  Physical Exam Updated Vital Signs BP (!) 144/64 (BP Location: Right Arm)   Pulse 74   Temp 98.4 F (36.9 C) (Oral)   Resp 18   Ht 5\' 5"  (1.651 m)   Wt 97.5 kg   SpO2 98%   BMI 35.78 kg/m   Physical Exam Vitals and nursing note reviewed.  Constitutional:      General: She is not in acute distress.    Appearance: She is well-developed.  HENT:     Head: Normocephalic and atraumatic.  Eyes:     Conjunctiva/sclera: Conjunctivae normal.  Cardiovascular:     Rate and Rhythm: Normal rate and regular rhythm.     Heart sounds: No murmur  heard. Pulmonary:     Effort: Pulmonary effort is normal. No respiratory distress.     Breath sounds: Normal breath sounds.  Abdominal:     Palpations: Abdomen is soft.     Tenderness: There is generalized abdominal tenderness. There is no guarding or rebound. Negative signs include Murphy's sign and McBurney's sign.  Musculoskeletal:  Cervical back: Neck supple.  Skin:    General: Skin is warm and dry.  Neurological:     Mental Status: She is alert.    ED Results / Procedures / Treatments   Labs (all labs ordered are listed, but only abnormal results are displayed) Labs Reviewed  COMPREHENSIVE METABOLIC PANEL - Abnormal; Notable for the following components:      Result Value   Potassium 3.4 (*)    Chloride 97 (*)    Glucose, Bld 180 (*)    BUN 6 (*)    Calcium 7.2 (*)    Anion gap 16 (*)    All other components within normal limits  CBC - Abnormal; Notable for the following components:   WBC 14.4 (*)    Platelets 419 (*)    All other components within normal limits  URINALYSIS, ROUTINE W REFLEX MICROSCOPIC - Abnormal; Notable for the following components:   Color, Urine COLORLESS (*)    Hgb urine dipstick TRACE (*)    Leukocytes,Ua TRACE (*)    All other components within normal limits  LIPASE, BLOOD    EKG None  Radiology CT ABDOMEN PELVIS W CONTRAST  Result Date: 03/31/2021 CLINICAL DATA:  Diverticulitis suspected. Left lower quadrant abdominal pain. EXAM: CT ABDOMEN AND PELVIS WITH CONTRAST TECHNIQUE: Multidetector CT imaging of the abdomen and pelvis was performed using the standard protocol following bolus administration of intravenous contrast. CONTRAST:  159mL OMNIPAQUE IOHEXOL 300 MG/ML  SOLN COMPARISON:  03/15/2005 FINDINGS: Lower chest: Calcified granuloma in the left base. Lungs are otherwise clear. Hepatobiliary: Diffuse fatty infiltration of the liver. No focal lesions. Portal veins are patent. Gallbladder and bile ducts are unremarkable. Pancreas:  Unremarkable. No pancreatic ductal dilatation or surrounding inflammatory changes. Spleen: Normal in size without focal abnormality. Adrenals/Urinary Tract: Adrenal glands are unremarkable. Kidneys are normal, without renal calculi, focal lesion, or hydronephrosis. Bladder is unremarkable. Stomach/Bowel: Stomach, small bowel, and colon are not abnormally distended. No wall thickening or inflammatory changes are appreciated. Surgical absence of the appendix. Vascular/Lymphatic: Scattered aortic calcification. No aneurysm. No significant lymphadenopathy. Reproductive: Uterus and ovaries are not enlarged. No abnormal pelvic masses. Other: No abdominal wall hernia or abnormality. No abdominopelvic ascites. Musculoskeletal: No acute or significant osseous findings. IMPRESSION: 1. No acute process demonstrated in the abdomen or pelvis. No evidence of bowel obstruction or inflammation. 2. Diffuse fatty infiltration of the liver. 3. Mild aortic atherosclerosis. Electronically Signed   By: Lucienne Capers M.D.   On: 03/31/2021 20:15   DG Chest Port 1 View  Result Date: 04/01/2021 CLINICAL DATA:  Nausea and vomiting EXAM: PORTABLE CHEST 1 VIEW COMPARISON:  09/02/2014 FINDINGS: Check shadow is within normal limits. Aortic calcifications are noted. The lungs are clear bilaterally. Previously seen right hilar prominence is no longer identified. No bony abnormality is noted. IMPRESSION: No active disease. Electronically Signed   By: Inez Catalina M.D.   On: 04/01/2021 19:13    Procedures Procedures   Medications Ordered in ED Medications  sodium chloride 0.9 % bolus 1,000 mL (0 mLs Intravenous Stopped 03/31/21 2058)  fentaNYL (SUBLIMAZE) injection 50 mcg (50 mcg Intravenous Given 03/31/21 1947)  ondansetron (ZOFRAN) injection 4 mg (4 mg Intravenous Given 03/31/21 1947)  iohexol (OMNIPAQUE) 300 MG/ML solution 100 mL (100 mLs Intravenous Contrast Given 03/31/21 1954)    ED Course  I have reviewed the triage vital  signs and the nursing notes.  Pertinent labs & imaging results that were available during my care of the patient were  reviewed by me and considered in my medical decision making (see chart for details).    MDM Rules/Calculators/A&P                          [age] year old lady presents to ER with concern for abdominal pain.  Symptoms have been associated with Ozempic, new diabetes medication.  On exam she appears well in no distress but did have generalized tenderness to palpation, worse in left lower quadrant.  Basic labs were stable.  CT scan negative for acute abdominal pelvic pathology.  Given reassuring work-up, believe she can be discharged and managed in the outpatient setting.  Recommend she follow-up with primary doctor to discuss symptoms further and management of her diabetes.  After the discussed management above, the patient was determined to be safe for discharge.  The patient was in agreement with this plan and all questions regarding their care were answered.  ED return precautions were discussed and the patient will return to the ED with any significant worsening of condition.  Final Clinical Impression(s) / ED Diagnoses Final diagnoses:  Abdominal pain, unspecified abdominal location    Rx / DC Orders ED Discharge Orders     None        Lucrezia Starch, MD 04/01/21 2051

## 2021-03-31 NOTE — ED Notes (Signed)
Patient given water for PO challenge.  

## 2021-03-31 NOTE — ED Triage Notes (Signed)
Pt has been taking Ozempic x 3 weeks and has been experiencing N/V/D with abd pain. PCP sent to ED for IV fluids. She had a normal lipase in the office.

## 2021-03-31 NOTE — Discharge Instructions (Addendum)
Follow-up with your primary doctor.  Discuss your diabetes regimen with your primary doctor.  If you develop worsening pain, vomiting, fever or other new concerning symptom, come back to ER for reassessment.

## 2021-04-01 ENCOUNTER — Inpatient Hospital Stay (HOSPITAL_BASED_OUTPATIENT_CLINIC_OR_DEPARTMENT_OTHER)
Admission: EM | Admit: 2021-04-01 | Discharge: 2021-04-04 | DRG: 093 | Disposition: A | Discharge status: Home Health | Payer: Medicare Other | Attending: Family Medicine | Admitting: Family Medicine

## 2021-04-01 ENCOUNTER — Emergency Department (HOSPITAL_BASED_OUTPATIENT_CLINIC_OR_DEPARTMENT_OTHER): Payer: Medicare Other

## 2021-04-01 ENCOUNTER — Encounter (HOSPITAL_BASED_OUTPATIENT_CLINIC_OR_DEPARTMENT_OTHER): Payer: Self-pay

## 2021-04-01 ENCOUNTER — Other Ambulatory Visit: Payer: Self-pay

## 2021-04-01 DIAGNOSIS — I4891 Unspecified atrial fibrillation: Secondary | ICD-10-CM | POA: Diagnosis present

## 2021-04-01 DIAGNOSIS — R569 Unspecified convulsions: Secondary | ICD-10-CM | POA: Diagnosis not present

## 2021-04-01 DIAGNOSIS — E1149 Type 2 diabetes mellitus with other diabetic neurological complication: Secondary | ICD-10-CM | POA: Diagnosis not present

## 2021-04-01 DIAGNOSIS — R112 Nausea with vomiting, unspecified: Secondary | ICD-10-CM | POA: Diagnosis not present

## 2021-04-01 DIAGNOSIS — E118 Type 2 diabetes mellitus with unspecified complications: Secondary | ICD-10-CM | POA: Diagnosis present

## 2021-04-01 DIAGNOSIS — H8109 Meniere's disease, unspecified ear: Secondary | ICD-10-CM | POA: Diagnosis present

## 2021-04-01 DIAGNOSIS — Z7901 Long term (current) use of anticoagulants: Secondary | ICD-10-CM

## 2021-04-01 DIAGNOSIS — J3089 Other allergic rhinitis: Secondary | ICD-10-CM | POA: Diagnosis present

## 2021-04-01 DIAGNOSIS — F331 Major depressive disorder, recurrent, moderate: Secondary | ICD-10-CM | POA: Diagnosis not present

## 2021-04-01 DIAGNOSIS — G8929 Other chronic pain: Secondary | ICD-10-CM | POA: Diagnosis not present

## 2021-04-01 DIAGNOSIS — Z91048 Other nonmedicinal substance allergy status: Secondary | ICD-10-CM

## 2021-04-01 DIAGNOSIS — E890 Postprocedural hypothyroidism: Secondary | ICD-10-CM | POA: Diagnosis present

## 2021-04-01 DIAGNOSIS — T50995A Adverse effect of other drugs, medicaments and biological substances, initial encounter: Secondary | ICD-10-CM | POA: Diagnosis not present

## 2021-04-01 DIAGNOSIS — Z20822 Contact with and (suspected) exposure to covid-19: Secondary | ICD-10-CM | POA: Diagnosis present

## 2021-04-01 DIAGNOSIS — Z880 Allergy status to penicillin: Secondary | ICD-10-CM

## 2021-04-01 DIAGNOSIS — E785 Hyperlipidemia, unspecified: Secondary | ICD-10-CM | POA: Diagnosis present

## 2021-04-01 DIAGNOSIS — E860 Dehydration: Secondary | ICD-10-CM | POA: Diagnosis present

## 2021-04-01 DIAGNOSIS — Z7984 Long term (current) use of oral hypoglycemic drugs: Secondary | ICD-10-CM

## 2021-04-01 DIAGNOSIS — Z882 Allergy status to sulfonamides status: Secondary | ICD-10-CM

## 2021-04-01 DIAGNOSIS — I480 Paroxysmal atrial fibrillation: Secondary | ICD-10-CM | POA: Diagnosis not present

## 2021-04-01 DIAGNOSIS — Z79899 Other long term (current) drug therapy: Secondary | ICD-10-CM

## 2021-04-01 DIAGNOSIS — E876 Hypokalemia: Secondary | ICD-10-CM

## 2021-04-01 DIAGNOSIS — K219 Gastro-esophageal reflux disease without esophagitis: Secondary | ICD-10-CM | POA: Diagnosis present

## 2021-04-01 DIAGNOSIS — R471 Dysarthria and anarthria: Secondary | ICD-10-CM | POA: Diagnosis present

## 2021-04-01 DIAGNOSIS — G928 Other toxic encephalopathy: Principal | ICD-10-CM | POA: Diagnosis present

## 2021-04-01 DIAGNOSIS — E1159 Type 2 diabetes mellitus with other circulatory complications: Secondary | ICD-10-CM | POA: Diagnosis not present

## 2021-04-01 DIAGNOSIS — Z91018 Allergy to other foods: Secondary | ICD-10-CM

## 2021-04-01 DIAGNOSIS — J45909 Unspecified asthma, uncomplicated: Secondary | ICD-10-CM | POA: Diagnosis not present

## 2021-04-01 DIAGNOSIS — Z881 Allergy status to other antibiotic agents status: Secondary | ICD-10-CM

## 2021-04-01 DIAGNOSIS — Z6835 Body mass index (BMI) 35.0-35.9, adult: Secondary | ICD-10-CM

## 2021-04-01 DIAGNOSIS — Z833 Family history of diabetes mellitus: Secondary | ICD-10-CM

## 2021-04-01 DIAGNOSIS — Z8249 Family history of ischemic heart disease and other diseases of the circulatory system: Secondary | ICD-10-CM

## 2021-04-01 DIAGNOSIS — E7800 Pure hypercholesterolemia, unspecified: Secondary | ICD-10-CM | POA: Diagnosis not present

## 2021-04-01 DIAGNOSIS — I10 Essential (primary) hypertension: Secondary | ICD-10-CM | POA: Diagnosis present

## 2021-04-01 DIAGNOSIS — E1140 Type 2 diabetes mellitus with diabetic neuropathy, unspecified: Secondary | ICD-10-CM | POA: Diagnosis not present

## 2021-04-01 DIAGNOSIS — M797 Fibromyalgia: Secondary | ICD-10-CM | POA: Diagnosis present

## 2021-04-01 DIAGNOSIS — G4733 Obstructive sleep apnea (adult) (pediatric): Secondary | ICD-10-CM | POA: Diagnosis present

## 2021-04-01 DIAGNOSIS — G2581 Restless legs syndrome: Secondary | ICD-10-CM | POA: Diagnosis present

## 2021-04-01 DIAGNOSIS — J302 Other seasonal allergic rhinitis: Secondary | ICD-10-CM | POA: Diagnosis present

## 2021-04-01 DIAGNOSIS — G4700 Insomnia, unspecified: Secondary | ICD-10-CM | POA: Diagnosis not present

## 2021-04-01 DIAGNOSIS — Z9049 Acquired absence of other specified parts of digestive tract: Secondary | ICD-10-CM

## 2021-04-01 DIAGNOSIS — E1136 Type 2 diabetes mellitus with diabetic cataract: Secondary | ICD-10-CM | POA: Diagnosis present

## 2021-04-01 DIAGNOSIS — E669 Obesity, unspecified: Secondary | ICD-10-CM | POA: Diagnosis present

## 2021-04-01 LAB — I-STAT VENOUS BLOOD GAS, ED
Acid-Base Excess: 1 mmol/L (ref 0.0–2.0)
Bicarbonate: 25 mmol/L (ref 20.0–28.0)
Calcium, Ion: 0.87 mmol/L — CL (ref 1.15–1.40)
HCT: 36 % (ref 36.0–46.0)
Hemoglobin: 12.2 g/dL (ref 12.0–15.0)
O2 Saturation: 75 %
Potassium: 2.6 mmol/L — CL (ref 3.5–5.1)
Sodium: 136 mmol/L (ref 135–145)
TCO2: 26 mmol/L (ref 22–32)
pCO2, Ven: 35.5 mmHg — ABNORMAL LOW (ref 44.0–60.0)
pH, Ven: 7.457 — ABNORMAL HIGH (ref 7.250–7.430)
pO2, Ven: 37 mmHg (ref 32.0–45.0)

## 2021-04-01 LAB — COMPREHENSIVE METABOLIC PANEL
ALT: 13 U/L (ref 0–44)
AST: 19 U/L (ref 15–41)
Albumin: 4 g/dL (ref 3.5–5.0)
Alkaline Phosphatase: 73 U/L (ref 38–126)
Anion gap: 17 — ABNORMAL HIGH (ref 5–15)
BUN: 5 mg/dL — ABNORMAL LOW (ref 8–23)
CO2: 26 mmol/L (ref 22–32)
Calcium: 6.9 mg/dL — ABNORMAL LOW (ref 8.9–10.3)
Chloride: 95 mmol/L — ABNORMAL LOW (ref 98–111)
Creatinine, Ser: 0.68 mg/dL (ref 0.44–1.00)
GFR, Estimated: >60 mL/min (ref >60)
Glucose, Bld: 151 mg/dL — ABNORMAL HIGH (ref 70–99)
Potassium: 2.8 mmol/L — ABNORMAL LOW (ref 3.5–5.1)
Sodium: 138 mmol/L (ref 135–145)
Total Bilirubin: 0.5 mg/dL (ref 0.3–1.2)
Total Protein: 6.9 g/dL (ref 6.5–8.1)

## 2021-04-01 LAB — RESP PANEL BY RT-PCR (FLU A&B, COVID) ARPGX2
Influenza A by PCR: NEGATIVE
Influenza B by PCR: NEGATIVE
SARS Coronavirus 2 by RT PCR: NEGATIVE

## 2021-04-01 LAB — CBC WITH DIFFERENTIAL/PLATELET
Abs Immature Granulocytes: 0.07 10*3/uL (ref 0.00–0.07)
Basophils Absolute: 0 10*3/uL (ref 0.0–0.1)
Basophils Relative: 0 %
Eosinophils Absolute: 0.1 10*3/uL (ref 0.0–0.5)
Eosinophils Relative: 1 %
HCT: 36.1 % (ref 36.0–46.0)
Hemoglobin: 12.2 g/dL (ref 12.0–15.0)
Immature Granulocytes: 1 %
Lymphocytes Relative: 20 %
Lymphs Abs: 2.2 10*3/uL (ref 0.7–4.0)
MCH: 27.4 pg (ref 26.0–34.0)
MCHC: 33.8 g/dL (ref 30.0–36.0)
MCV: 80.9 fL (ref 80.0–100.0)
Monocytes Absolute: 0.5 10*3/uL (ref 0.1–1.0)
Monocytes Relative: 5 %
Neutro Abs: 8.3 10*3/uL — ABNORMAL HIGH (ref 1.7–7.7)
Neutrophils Relative %: 73 %
Platelets: 350 10*3/uL (ref 150–400)
RBC: 4.46 MIL/uL (ref 3.87–5.11)
RDW: 12.4 % (ref 11.5–15.5)
WBC: 11.3 10*3/uL — ABNORMAL HIGH (ref 4.0–10.5)
nRBC: 0 % (ref 0.0–0.2)

## 2021-04-01 LAB — TROPONIN I (HIGH SENSITIVITY)
Troponin I (High Sensitivity): 9 ng/L (ref <18)
Troponin I (High Sensitivity): 13 ng/L (ref <18)

## 2021-04-01 LAB — CBG MONITORING, ED: Glucose-Capillary: 184 mg/dL — ABNORMAL HIGH (ref 70–99)

## 2021-04-01 LAB — MAGNESIUM: Magnesium: <0.5 mg/dL — CL (ref 1.7–2.4)

## 2021-04-01 MED ORDER — LACTATED RINGERS IV SOLN: INTRAVENOUS | Status: DC

## 2021-04-01 MED ORDER — POTASSIUM CHLORIDE 10 MEQ/100ML IV SOLN
10.0000 meq | INTRAVENOUS | Status: AC
  Administered 2021-04-01 (×2): 10 meq via INTRAVENOUS
  Filled 2021-04-01 (×2): qty 100

## 2021-04-01 MED ORDER — METOPROLOL TARTRATE 5 MG/5ML IV SOLN
5.0000 mg | Freq: Once | INTRAVENOUS | Status: AC
  Administered 2021-04-01: 5 mg via INTRAVENOUS
  Filled 2021-04-01: qty 5

## 2021-04-01 MED ORDER — DILTIAZEM HCL 25 MG/5ML IV SOLN: 10.0000 mg | Freq: Once | INTRAVENOUS | Status: DC

## 2021-04-01 MED ORDER — METOPROLOL TARTRATE 25 MG PO TABS
50.0000 mg | ORAL_TABLET | Freq: Once | ORAL | Status: AC
  Administered 2021-04-01: 50 mg via ORAL
  Filled 2021-04-01: qty 2

## 2021-04-01 MED ORDER — LACTATED RINGERS IV BOLUS
1000.0000 mL | Freq: Once | INTRAVENOUS | Status: AC
  Administered 2021-04-01: 1000 mL via INTRAVENOUS

## 2021-04-01 MED ORDER — LORAZEPAM 2 MG/ML IJ SOLN: 0.5000 mg | Freq: Once | INTRAMUSCULAR | Status: AC

## 2021-04-01 MED ORDER — LORAZEPAM 2 MG/ML IJ SOLN
INTRAMUSCULAR | Status: AC
  Administered 2021-04-01: 0.5 mg via INTRAVENOUS
  Filled 2021-04-01: qty 1

## 2021-04-01 MED ORDER — METOCLOPRAMIDE HCL 5 MG/ML IJ SOLN
10.0000 mg | Freq: Once | INTRAMUSCULAR | Status: AC
  Administered 2021-04-01: 10 mg via INTRAVENOUS
  Filled 2021-04-01: qty 2

## 2021-04-01 MED ORDER — MAGNESIUM SULFATE 2 GM/50ML IV SOLN
2.0000 g | Freq: Once | INTRAVENOUS | Status: AC
  Administered 2021-04-01: 2 g via INTRAVENOUS
  Filled 2021-04-01: qty 50

## 2021-04-01 NOTE — ED Notes (Signed)
[phone]  Santiago Glad Budus[place]-Sister

## 2021-04-01 NOTE — ED Provider Notes (Addendum)
[facility] EMERGENCY DEPT Provider Note   CSN: [id number] Arrival date & time: 04/01/21  1525     History Chief Complaint  Patient presents with   Nausea   Dehydration    Suzanne Greer is a 69 y.o. female.  Patient is a 70 year old female with a history of diabetes, atrial fibrillation, GERD, hypertension, hyperlipidemia and Mnire's disease who is presenting today with persistent nausea and vomiting.  Patient reports since Saturday (6 days prior to arrival) she has had ongoing vomiting, dry heaving and initially had diarrhea which is pretty much stopped.  She thinks this is related to her Ozempic.  This was the third dose that she has had of this and she did have nausea and diarrhea with the prior 2 doses but it is been much worse this time.  She has not had fever, cough but is noticed a little shortness of breath.  She denies any chest pain but reports diffuse tenderness in her abdomen from all the dry heaving.  She has not been able to hold anything down and every time she tries to drink or eat anything she vomits.  Today she tried to eat half a banana and vomited it up.  She was seen in the emergency room yesterday and received IV fluids and Zofran.  She has been trying to take Zofran at home but is not helped.  In the emergency room yesterday she was noted to have a leukocytosis of 14,000 with normal lipase and CMP with an anion gap of 16 with a blood sugar of 180.  Renal function was normal.  She had a negative CT of the abdomen and pelvis.  She returned today for ongoing nausea and vomiting.  The history is provided by the patient and medical records.      Past Medical History:  Diagnosis Date   Atrial fibrillation ([place])    Back pain    Diabetes mellitus    Dysrhythmia    history Atrial Flutter. 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   Obesity    Restless legs    Sleep apnea    no cpap use now-unable to tolerate mask, surgery a yr ago   Vertigo     Patient Active Problem List   Diagnosis Date Noted   Intractable nausea and vomiting 04/01/2021   OSA (obstructive sleep apnea) 06/14/2018   Atrial fibrillation (HCC)    Obesity    Type 2 diabetes mellitus with complication (HCC)    Fibromyalgia    Hypertension    GERD (gastroesophageal reflux disease)    Insomnia    Meniere disease    Vertigo     Past Surgical History:  Procedure Laterality Date   APPENDECTOMY     BUNIONECTOMY     ESOPHAGOGASTRODUODENOSCOPY (EGD) WITH PROPOFOL N/A 08/16/2015   Procedure: ESOPHAGOGASTRODUODENOSCOPY (EGD) WITH PROPOFOL;  Surgeon: Garlan Fair, MD;  Location: [facility];  Service: Endoscopy;  Laterality: N/A;   GANGLION CYST   1980   REMOVED    HEMORROIDECTOMY     THYROIDECTOMY, PARTIAL  2005   "partial for growth-benign"   UPPER ENDOSCOPIC ULTRASOUND W/ FNA     UVULECTOMY     excised about a  yr ago [facility]     OB History   No obstetric history on file.     Family History  Problem Relation Age of Onset   Cancer Mother 83       PERITONEAL CANCER   Heart disease Mother    Diabetes Father        unsure of complete medical hx - not seen since her 87s   Hepatitis C Brother    Drug abuse Brother     Social History   Tobacco Use   Smoking status: Never   Smokeless tobacco: Never  Substance Use Topics   Alcohol use: No   Drug use: No    Home Medications Prior to Admission medications   Medication Sig Start Date End Date Taking? Authorizing Provider  apixaban (ELIQUIS) 5 MG TABS tablet Take 1 tablet (5 mg total) by mouth 2 (two) times daily. 09/22/20   Martinique, Peter M, MD  atorvastatin (LIPITOR) 20 MG tablet Take 20 mg by mouth daily. 08/03/15   [provider]   cefdinir (OMNICEF) 300 MG capsule Take 300 mg by mouth 2 (two) times daily. 08/30/20   [provider]  cyclobenzaprine (FLEXERIL) 10 MG tablet TAKE 1 TABLET BY MOUTH EVERYDAY AT BEDTIME. Call [phone] to schedule yearly follow up for ongoing refills 01/17/21   Sater, Nanine Means, MD  diltiazem (CARDIZEM CD) 120 MG 24 hr capsule Take 1 capsule (120 mg total) by mouth daily. 09/17/20 12/16/20  Martinique, Peter M, MD  fluticasone (FLONASE) 50 MCG/ACT nasal spray Place 2 sprays into both nostrils daily.    [provider]  Magnesium 400 MG TABS Take 400 mg by mouth daily.     [provider]  Melatonin 10 MG TABS Take 10 mg by mouth at bedtime.     [provider]  metFORMIN (GLUCOPHAGE-XR) 500 MG 24 hr tablet Take 1,000 mg by mouth 2 (two) times daily.     [provider]  metoprolol succinate (TOPROL-XL) 50 MG 24 hr tablet Take 1 tablet (50 mg total) by mouth daily. 09/09/20   Martinique, Peter M, MD  montelukast (SINGULAIR) 10 MG tablet Take 10 mg by mouth at bedtime.    [provider]  omeprazole (PRILOSEC) 40 MG capsule omeprazole 40 mg capsule,delayed release    [provider]  propafenone (RYTHMOL) 300 MG tablet Take 1 tablet (300 mg total) by mouth every 8 (eight) hours. 09/09/20   Martinique, Peter M, MD  Semaglutide (OZEMPIC, 0.25 OR 0.5 MG/DOSE, ) Inject into the skin.    [provider]  traMADol (ULTRAM) 50 MG tablet Take 50 mg by mouth 3 (three) times daily as needed (Pain).     [provider]  VITAMIN D PO Take 1 tablet by mouth daily.    [provider]    Allergies    Doxycycline, Yeast-related products, Molds & smuts, Penicillins, and Sulfa drugs cross reactors  Review of Systems   Review of Systems  All other systems reviewed and are negative.  Physical Exam Updated Vital Signs BP (!) 157/75   Pulse 90   Temp 98.5 F (36.9 C)   Resp 18   SpO2 97%   Physical Exam Vitals and nursing note  reviewed.  Constitutional:      General: She is not in acute distress.    Appearance: She is well-developed.  HENT:     Head: Normocephalic and atraumatic.     Mouth/Throat:     Mouth: Mucous membranes  are dry.  Eyes:     Pupils: Pupils are equal, round, and reactive to light.  Cardiovascular:     Rate and Rhythm: Normal rate and regular rhythm.     Heart sounds: Normal heart sounds. No murmur heard.   No friction rub.  Pulmonary:     Effort: Pulmonary effort is normal.     Breath sounds: Normal breath sounds. No wheezing or rales.  Abdominal:     General: Bowel sounds are normal. There is no distension.     Palpations: Abdomen is soft.     Tenderness: There is abdominal tenderness. There is no guarding or rebound.     Comments: Mild diffuse tenderness  Musculoskeletal:        General: No tenderness. Normal range of motion.     Cervical back: Normal range of motion and neck supple.     Right lower leg: No edema.     Left lower leg: No edema.     Comments: No edema  Skin:    General: Skin is warm and dry.     Findings: No rash.  Neurological:     Mental Status: She is alert and oriented to person, place, and time. Mental status is at baseline.     Cranial Nerves: No cranial nerve deficit.  Psychiatric:        Behavior: Behavior normal.    ED Results / Procedures / Treatments   Labs (all labs ordered are listed, but only abnormal results are displayed) Labs Reviewed  COMPREHENSIVE METABOLIC PANEL - Abnormal; Notable for the following components:      Result Value   Potassium 2.8 (*)    Chloride 95 (*)    Glucose, Bld 151 (*)    BUN 5 (*)    Calcium 6.9 (*)    Anion gap 17 (*)    All other components within normal limits  CBC WITH DIFFERENTIAL/PLATELET - Abnormal; Notable for the following components:   WBC 11.3 (*)    Neutro Abs 8.3 (*)    All other components within normal limits  MAGNESIUM - Abnormal; Notable for the following components:   Magnesium <0.5 (*)     All other components within normal limits  I-STAT VENOUS BLOOD GAS, ED - Abnormal; Notable for the following components:   pH, Ven 7.457 (*)    pCO2, Ven 35.5 (*)    Potassium 2.6 (*)    Calcium, Ion 0.87 (*)    All other components within normal limits  CBG MONITORING, ED - Abnormal; Notable for the following components:   Glucose-Capillary 184 (*)    All other components within normal limits  RESP PANEL BY RT-PCR (FLU A&B, COVID) ARPGX2  TROPONIN I (HIGH SENSITIVITY)  TROPONIN I (HIGH SENSITIVITY)    EKG EKG Interpretation  Date/Time:  Friday April 01 2021 19:21:38 EDT Ventricular Rate:  128 PR Interval:    QRS Duration: 87 QT Interval:  293 QTC Calculation: 421 R Axis:   68 Text Interpretation: persistent Atrial fibrillation Ventricular premature complex Repol abnrm suggests ischemia, diffuse leads Confirmed by Blanchie Dessert [phone]) on 04/01/2021 7:27:15 PM  Radiology CT Head Wo Contrast  Result Date: 04/01/2021 CLINICAL DATA:  Status post seizure. EXAM: CT HEAD WITHOUT CONTRAST TECHNIQUE: Contiguous axial images were obtained from the base of the skull through the vertex without intravenous contrast. COMPARISON:  September 25, 2020 FINDINGS: Brain: No evidence of acute infarction, hemorrhage, hydrocephalus, extra-axial collection or mass lesion/mass effect. Vascular: No hyperdense vessel or unexpected calcification.  Skull: Normal. Negative for fracture or focal lesion. Sinuses/Orbits: There is mild left ethmoid sinus mucosal thickening. Other: None. IMPRESSION: No acute intracranial abnormality. Electronically Signed   By: Virgina Norfolk M.D.   On: 04/01/2021 23:07   CT ABDOMEN PELVIS W CONTRAST  Result Date: 03/31/2021 CLINICAL DATA:  Diverticulitis suspected. Left lower quadrant abdominal pain. EXAM: CT ABDOMEN AND PELVIS WITH CONTRAST TECHNIQUE: Multidetector CT imaging of the abdomen and pelvis was performed using the standard protocol following bolus administration  of intravenous contrast. CONTRAST:  142mL OMNIPAQUE IOHEXOL 300 MG/ML  SOLN COMPARISON:  03/15/2005 FINDINGS: Lower chest: Calcified granuloma in the left base. Lungs are otherwise clear. Hepatobiliary: Diffuse fatty infiltration of the liver. No focal lesions. Portal veins are patent. Gallbladder and bile ducts are unremarkable. Pancreas: Unremarkable. No pancreatic ductal dilatation or surrounding inflammatory changes. Spleen: Normal in size without focal abnormality. Adrenals/Urinary Tract: Adrenal glands are unremarkable. Kidneys are normal, without renal calculi, focal lesion, or hydronephrosis. Bladder is unremarkable. Stomach/Bowel: Stomach, small bowel, and colon are not abnormally distended. No wall thickening or inflammatory changes are appreciated. Surgical absence of the appendix. Vascular/Lymphatic: Scattered aortic calcification. No aneurysm. No significant lymphadenopathy. Reproductive: Uterus and ovaries are not enlarged. No abnormal pelvic masses. Other: No abdominal wall hernia or abnormality. No abdominopelvic ascites. Musculoskeletal: No acute or significant osseous findings. IMPRESSION: 1. No acute process demonstrated in the abdomen or pelvis. No evidence of bowel obstruction or inflammation. 2. Diffuse fatty infiltration of the liver. 3. Mild aortic atherosclerosis. Electronically Signed   By: Lucienne Capers M.D.   On: 03/31/2021 20:15   DG Chest Port 1 View  Result Date: 04/01/2021 CLINICAL DATA:  Nausea and vomiting EXAM: PORTABLE CHEST 1 VIEW COMPARISON:  09/02/2014 FINDINGS: Check shadow is within normal limits. Aortic calcifications are noted. The lungs are clear bilaterally. Previously seen right hilar prominence is no longer identified. No bony abnormality is noted. IMPRESSION: No active disease. Electronically Signed   By: Inez Catalina M.D.   On: 04/01/2021 19:13    Procedures Procedures   Medications Ordered in ED Medications  lactated ringers infusion ( Intravenous New  Bag/Given 04/01/21 2127)  lactated ringers bolus 1,000 mL (0 mLs Intravenous Stopped 04/01/21 1923)  metoCLOPramide (REGLAN) injection 10 mg (10 mg Intravenous Given 04/01/21 1716)  metoprolol tartrate (LOPRESSOR) tablet 50 mg (50 mg Oral Given 04/01/21 1950)  metoprolol tartrate (LOPRESSOR) injection 5 mg (5 mg Intravenous Given 04/01/21 1923)  metoprolol tartrate (LOPRESSOR) injection 5 mg (5 mg Intravenous Given 04/01/21 2016)  potassium chloride 10 mEq in 100 mL IVPB (10 mEq Intravenous New Bag/Given 04/01/21 2159)  magnesium sulfate IVPB 2 g 50 mL (0 g Intravenous Stopped 04/01/21 2302)  LORazepam (ATIVAN) injection 0.5 mg (0.5 mg Intravenous Given 04/01/21 2301)    ED Course  I have reviewed the triage vital signs and the nursing notes.  Pertinent labs & imaging results that were available during my care of the patient were reviewed by me and considered in my medical decision making (see chart for details).    MDM Rules/Calculators/A&P                           Patient returning today due to persistent nausea and vomiting.  Patient seen yesterday for the same symptoms.  Patient feels that this is most likely a reaction to the Ozempic she had 6 days ago.  She is denying any infectious symptoms.  She did have a CT yesterday  that was negative for acute abdominal process.  Anion gap of 16 yesterday and mild leukocytosis of 14.  Urine without ketones.  On exam patient has mild diffuse abdominal tenderness but low suspicion for a new acute abdominal process.  We will recheck CMP to ensure no worsening of her anion gap.  Patient given IV fluids and Reglan.  Also will recheck CBC to evaluate trending of her white count.  11:15 PM Wbc count today has improved to 11, however today pt is hypokalemic at 2.8.  Anion gap increased mildly to 17.  Venous pH is normal.  Magnesium is pending.  However given patient's return visit, persistent nausea and vomiting, hypokalemia and mildly increasing anion  gap will admit for further care.  Also patient did go into atrial fibrillation with RVR while she was here as she reported she was unable to tolerate any of her home medications.  She received 2 rounds of IV metoprolol and was able to tolerate her home oral metoprolol with improvement of her heart rate now back to sinus rhythm at 84 bpm.  Will admit for further care.  11:15 PM Patient's magnesium is less than 0.05 and she was replaced with IV magnesium.  Patient was waiting on an admission bed when the nurse went and noticing she had some sinus tachycardia and she was snoring heavily.  However patient was suddenly altered.  She would not respond to verbal stimuli but would just look around.  There was no generalized shaking noted but patient has bitten her tongue and concerned that she had a seizure.  No prior history of seizure disorder.  Patient is not an alcohol user.  She is not on any benzodiazepines.  And she was given 1 mg of Ativan.  We will do a CT of her head to ensure no other acute pathology.  Vital signs remained stable.  11:15 PM Head CT neg.  Pt is more awake now but drowsy from ativan but no responsive.  MDM   Amount and/or Complexity of Data Reviewed Clinical lab tests: ordered and reviewed Tests in the radiology section of CPT: ordered and reviewed Tests in the medicine section of CPT: ordered and reviewed Independent visualization of images, tracings, or specimens: yes  Patient Progress Patient progress: stable  CRITICAL CARE Performed by: Belem Hintze Total critical care time: 30 minutes Critical care time was exclusive of separately billable procedures and treating other patients. Critical care was necessary to treat or prevent imminent or life-threatening deterioration. Critical care was time spent personally by me on the following activities: development of treatment plan with patient and/or surrogate as well as nursing, discussions with consultants, evaluation of  patient's response to treatment, examination of patient, obtaining history from patient or surrogate, ordering and performing treatments and interventions, ordering and review of laboratory studies, ordering and review of radiographic studies, pulse oximetry and re-evaluation of patient's condition.   Final Clinical Impression(s) / ED Diagnoses Final diagnoses:  Nausea and vomiting, unspecified vomiting type  Hypokalemia  Dehydration  Paroxysmal A-fib [facility])    Rx / DC Orders ED Discharge Orders     None        Blanchie Dessert, MD 04/01/21 2118    Blanchie Dessert, MD 04/01/21 2318

## 2021-04-01 NOTE — ED Notes (Signed)
Called [facility] to advise patient has a bed ready @ [facility], 2C-10

## 2021-04-01 NOTE — Plan of Care (Signed)
[facility] will assume care on arrival to accepting facility. Until arrival, care as per EDP. However, [facility] available 24/7 for questions and assistance.  Nursing staff please page [facility] Admits and Consults ([phone]) as soon as the patient arrives the hospital.   

## 2021-04-01 NOTE — ED Triage Notes (Signed)
Pt presents stating that needs fluid because she is unable to keep anything down. Pt was seen here yesterday for same and received fluid and nausea medication.

## 2021-04-02 ENCOUNTER — Inpatient Hospital Stay (HOSPITAL_COMMUNITY): Payer: Medicare Other

## 2021-04-02 DIAGNOSIS — Z20822 Contact with and (suspected) exposure to covid-19: Secondary | ICD-10-CM | POA: Diagnosis not present

## 2021-04-02 DIAGNOSIS — Z833 Family history of diabetes mellitus: Secondary | ICD-10-CM | POA: Diagnosis not present

## 2021-04-02 DIAGNOSIS — E1136 Type 2 diabetes mellitus with diabetic cataract: Secondary | ICD-10-CM | POA: Diagnosis not present

## 2021-04-02 DIAGNOSIS — R4701 Aphasia: Secondary | ICD-10-CM

## 2021-04-02 DIAGNOSIS — G4733 Obstructive sleep apnea (adult) (pediatric): Secondary | ICD-10-CM | POA: Diagnosis present

## 2021-04-02 DIAGNOSIS — M797 Fibromyalgia: Secondary | ICD-10-CM | POA: Diagnosis present

## 2021-04-02 DIAGNOSIS — R569 Unspecified convulsions: Secondary | ICD-10-CM | POA: Diagnosis not present

## 2021-04-02 DIAGNOSIS — J3089 Other allergic rhinitis: Secondary | ICD-10-CM | POA: Diagnosis not present

## 2021-04-02 DIAGNOSIS — R112 Nausea with vomiting, unspecified: Secondary | ICD-10-CM | POA: Diagnosis present

## 2021-04-02 DIAGNOSIS — Z8249 Family history of ischemic heart disease and other diseases of the circulatory system: Secondary | ICD-10-CM | POA: Diagnosis not present

## 2021-04-02 DIAGNOSIS — I4891 Unspecified atrial fibrillation: Secondary | ICD-10-CM

## 2021-04-02 DIAGNOSIS — G928 Other toxic encephalopathy: Secondary | ICD-10-CM | POA: Diagnosis not present

## 2021-04-02 DIAGNOSIS — H8109 Meniere's disease, unspecified ear: Secondary | ICD-10-CM | POA: Diagnosis present

## 2021-04-02 DIAGNOSIS — Z91048 Other nonmedicinal substance allergy status: Secondary | ICD-10-CM | POA: Diagnosis not present

## 2021-04-02 DIAGNOSIS — E876 Hypokalemia: Secondary | ICD-10-CM

## 2021-04-02 DIAGNOSIS — E890 Postprocedural hypothyroidism: Secondary | ICD-10-CM | POA: Diagnosis not present

## 2021-04-02 DIAGNOSIS — K219 Gastro-esophageal reflux disease without esophagitis: Secondary | ICD-10-CM | POA: Diagnosis present

## 2021-04-02 DIAGNOSIS — E860 Dehydration: Secondary | ICD-10-CM | POA: Diagnosis present

## 2021-04-02 DIAGNOSIS — Z6835 Body mass index (BMI) 35.0-35.9, adult: Secondary | ICD-10-CM | POA: Diagnosis not present

## 2021-04-02 DIAGNOSIS — I10 Essential (primary) hypertension: Secondary | ICD-10-CM | POA: Diagnosis not present

## 2021-04-02 DIAGNOSIS — G2581 Restless legs syndrome: Secondary | ICD-10-CM | POA: Diagnosis not present

## 2021-04-02 DIAGNOSIS — E785 Hyperlipidemia, unspecified: Secondary | ICD-10-CM | POA: Diagnosis present

## 2021-04-02 DIAGNOSIS — R471 Dysarthria and anarthria: Secondary | ICD-10-CM | POA: Diagnosis present

## 2021-04-02 DIAGNOSIS — E669 Obesity, unspecified: Secondary | ICD-10-CM | POA: Diagnosis not present

## 2021-04-02 DIAGNOSIS — R519 Headache, unspecified: Secondary | ICD-10-CM | POA: Diagnosis not present

## 2021-04-02 DIAGNOSIS — J302 Other seasonal allergic rhinitis: Secondary | ICD-10-CM | POA: Diagnosis not present

## 2021-04-02 DIAGNOSIS — Z9049 Acquired absence of other specified parts of digestive tract: Secondary | ICD-10-CM | POA: Diagnosis not present

## 2021-04-02 DIAGNOSIS — G4700 Insomnia, unspecified: Secondary | ICD-10-CM | POA: Diagnosis present

## 2021-04-02 LAB — HIV ANTIBODY (ROUTINE TESTING W REFLEX): HIV Screen 4th Generation wRfx: NONREACTIVE

## 2021-04-02 LAB — GLUCOSE, CAPILLARY
Glucose-Capillary: 109 mg/dL — ABNORMAL HIGH (ref 70–99)
Glucose-Capillary: 127 mg/dL — ABNORMAL HIGH (ref 70–99)
Glucose-Capillary: 88 mg/dL (ref 70–99)
Glucose-Capillary: 106 mg/dL — ABNORMAL HIGH (ref 70–99)

## 2021-04-02 LAB — BASIC METABOLIC PANEL
Anion gap: 12 (ref 5–15)
BUN: <5 mg/dL — ABNORMAL LOW (ref 8–23)
CO2: 25 mmol/L (ref 22–32)
Calcium: 7 mg/dL — ABNORMAL LOW (ref 8.9–10.3)
Chloride: 100 mmol/L (ref 98–111)
Creatinine, Ser: 0.74 mg/dL (ref 0.44–1.00)
GFR, Estimated: >60 mL/min (ref >60)
Glucose, Bld: 132 mg/dL — ABNORMAL HIGH (ref 70–99)
Potassium: 2.6 mmol/L — CL (ref 3.5–5.1)
Sodium: 137 mmol/L (ref 135–145)

## 2021-04-02 LAB — HEMOGLOBIN A1C
Hgb A1c MFr Bld: 7.2 % — ABNORMAL HIGH (ref 4.8–5.6)
Mean Plasma Glucose: 159.94 mg/dL

## 2021-04-02 LAB — MRSA NEXT GEN BY PCR, NASAL: MRSA by PCR Next Gen: NOT DETECTED

## 2021-04-02 LAB — POTASSIUM: Potassium: 3.8 mmol/L (ref 3.5–5.1)

## 2021-04-02 LAB — MAGNESIUM: Magnesium: 2.3 mg/dL (ref 1.7–2.4)

## 2021-04-02 MED ORDER — POTASSIUM CHLORIDE 10 MEQ/100ML IV SOLN
10.0000 meq | INTRAVENOUS | Status: AC
Start: 2021-04-02 — End: 2021-04-02
  Administered 2021-04-02 (×2): 10 meq via INTRAVENOUS
  Filled 2021-04-02 (×2): qty 100

## 2021-04-02 MED ORDER — FAMOTIDINE 20 MG PO TABS
20.0000 mg | ORAL_TABLET | Freq: Two times a day (BID) | ORAL | Status: DC
  Administered 2021-04-02 – 2021-04-04 (×5): 20 mg via ORAL
  Filled 2021-04-02 (×5): qty 1

## 2021-04-02 MED ORDER — METOPROLOL SUCCINATE ER 50 MG PO TB24
50.0000 mg | ORAL_TABLET | Freq: Every day | ORAL | Status: DC
  Administered 2021-04-02 – 2021-04-04 (×3): 50 mg via ORAL
  Filled 2021-04-02 (×3): qty 1

## 2021-04-02 MED ORDER — POTASSIUM CHLORIDE 10 MEQ/100ML IV SOLN
10.0000 meq | INTRAVENOUS | Status: AC
  Administered 2021-04-02 (×3): 10 meq via INTRAVENOUS
  Filled 2021-04-02 (×3): qty 100

## 2021-04-02 MED ORDER — POTASSIUM CHLORIDE 10 MEQ/100ML IV SOLN
10.0000 meq | INTRAVENOUS | Status: AC
  Administered 2021-04-02 (×2): 10 meq via INTRAVENOUS
  Filled 2021-04-02 (×2): qty 100

## 2021-04-02 MED ORDER — MAGNESIUM SULFATE 2 GM/50ML IV SOLN
2.0000 g | Freq: Once | INTRAVENOUS | Status: AC
  Administered 2021-04-02: 2 g via INTRAVENOUS
  Filled 2021-04-02: qty 50

## 2021-04-02 MED ORDER — ACETAMINOPHEN 325 MG PO TABS
650.0000 mg | ORAL_TABLET | Freq: Four times a day (QID) | ORAL | Status: DC | PRN
  Administered 2021-04-02 – 2021-04-03 (×2): 650 mg via ORAL
  Filled 2021-04-02 (×2): qty 2

## 2021-04-02 MED ORDER — POTASSIUM CHLORIDE 2 MEQ/ML IV SOLN
INTRAVENOUS | Status: DC
  Filled 2021-04-02 (×5): qty 1000

## 2021-04-02 MED ORDER — APIXABAN 5 MG PO TABS
5.0000 mg | ORAL_TABLET | Freq: Two times a day (BID) | ORAL | Status: DC
  Administered 2021-04-02 – 2021-04-04 (×5): 5 mg via ORAL
  Filled 2021-04-02 (×5): qty 1

## 2021-04-02 MED ORDER — ACETAMINOPHEN 650 MG RE SUPP: 650.0000 mg | Freq: Four times a day (QID) | RECTAL | Status: DC | PRN

## 2021-04-02 MED ORDER — POTASSIUM CHLORIDE 10 MEQ/100ML IV SOLN
10.0000 meq | INTRAVENOUS | Status: AC
Start: 2021-04-02 — End: 2021-04-02
  Administered 2021-04-02 (×3): 10 meq via INTRAVENOUS
  Filled 2021-04-02 (×3): qty 100

## 2021-04-02 MED ORDER — PROCHLORPERAZINE EDISYLATE 10 MG/2ML IJ SOLN
10.0000 mg | Freq: Four times a day (QID) | INTRAMUSCULAR | Status: DC | PRN
  Filled 2021-04-02: qty 2

## 2021-04-02 MED ORDER — INSULIN ASPART 100 UNIT/ML IJ SOLN: 0.0000 [IU] | Freq: Every day | INTRAMUSCULAR | Status: DC

## 2021-04-02 MED ORDER — PROPAFENONE HCL 150 MG PO TABS
300.0000 mg | ORAL_TABLET | Freq: Three times a day (TID) | ORAL | Status: DC
  Administered 2021-04-02 – 2021-04-04 (×8): 300 mg via ORAL
  Filled 2021-04-02 (×10): qty 2

## 2021-04-02 MED ORDER — INSULIN ASPART 100 UNIT/ML IJ SOLN
0.0000 [IU] | Freq: Three times a day (TID) | INTRAMUSCULAR | Status: DC
  Administered 2021-04-02 – 2021-04-04 (×6): 2 [IU] via SUBCUTANEOUS

## 2021-04-02 NOTE — Assessment & Plan Note (Signed)
Stable

## 2021-04-02 NOTE — Assessment & Plan Note (Signed)
Hold her Ozempic.  Start sliding scale insulin.

## 2021-04-02 NOTE — Assessment & Plan Note (Signed)
Patient had nearly an undetectable magnesium level.  I am surprised she did not have torsades in her EKG.  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  We will continue with magnesium replacement.  She did receive 2 g of magnesium at the ER.  We will give her another 4 g of magnesium.  We will also replace her potassium.  We will check her potassium and her magnesium level this morning.  Patient has been on Nexium for many years now.  This could be the source of her hypomagnesemia.  We will need to switch her to Pepcid.

## 2021-04-02 NOTE — Progress Notes (Signed)
This is a 70 year old lady with a history of atrial fibrillation on Eliquis, hypertension and other comorbidities who was admitted to hospital service secondary to nausea vomiting and actually had 1 episode of witnessed seizure in the ED as well.  She was found to have significantly low magnesium.  This was thought to be because of her seizure.  Seen and examined this morning.  She feels much better.  Nausea and vomiting has improved.  Also H&P says that she had QTC of 512 MS.  Repeat EKG yesterday showed improved QTC.  I will repeat another EKG today.  I will start her on Compazine as needed which does not cause QT prolongation.  Her magnesium is normal.  Although it does sound like her seizure was secondary to severe hypomagnesemia but I would also like to go ahead and order EEG.  Patient has already eaten the breakfast and tolerated well.  Her potassium is still low.  I will replace aggressively and also including the IV fluids LR.  We will recheck potassium at 2 PM today and replace more if needed.  Other medical issues are stable.

## 2021-04-02 NOTE — Procedures (Signed)
History: 70 year old female being evaluated for possible seizure after an episode of aphasia  Sedation: None  Technique: This EEG was acquired with electrodes placed according to the International 10-20 electrode system (including Fp1, Fp2, F3, F4, C3, C4, P3, P4, O1, O2, T3, T4, T5, T6, A1, A2, Fz, Cz, Pz). The following electrodes were missing or displaced: none.   Background: The background consists of intermixed alpha and beta activities. There is a well defined posterior dominant rhythm of 9-10 Hz that attenuates with eye opening. Sleep is recorded with normal appearing structures.    Photic stimulation: Physiologic driving is not performed    EEG Abnormalities: None  Clinical Interpretation: This normal EEG is recorded in the waking and sleep state. There was no seizure or seizure predisposition recorded on this study. Please note that lack of epileptiform activity on EEG does not preclude the possibility of epilepsy.   Roland Rack, MD [facility] [phone]  If 7pm- 7am, please page neurology on call as listed in [place].

## 2021-04-02 NOTE — Assessment & Plan Note (Signed)
Patient admitted to progressive care.  Continue with IV fluids.  Cannot use any QT prolonging antinausea medications due to her prolonged QT.  May need to use IV Ativan if needed.

## 2021-04-02 NOTE — Assessment & Plan Note (Signed)
Chronic. 

## 2021-04-02 NOTE — Subjective & Objective (Signed)
CC: N/V, seizure HPI: 70 yo WF with hx of afib, on Eliquis, HTN, DM2, OSA, obesity, presented to ER with N/V for about 1 week.  Patient recently started on Ozempic for diabetes.  She has had nausea vomiting with her first 2 doses.  Reportedly she had her third dose and started having violent vomiting.  She is unable to keep anything down.  She was seen in the ER 2 days ago and started on IV fluids.  She discharged home.  She came back today with recurrent nausea vomiting.  She is unable to keep anything down the ER.  She started IV fluids.  Lab evaluation there showed a potassium of 2.8, BUN of 5 creatinine 0.68.  Patient was on schedule for transfer to [facility] due to continued nausea vomiting.  Patient had a tonic-clonic seizure while still at the [facility] ER.  Serum magnesium was checked and was less than 0.5.  Patient does have a history of hypomagnesemia.  She has has a history of GERD and has been on Nexium for years.  Patient was given 2 g of IV magnesium.  Patient is postictal and cannot give any accurate review of systems or history.  She is confused.  She is not oriented to date.  She knows she is in the hospital but does not know which Hospital she is at  EKG demonstrated prolonged QT with a QTC of 512 ms.  Patient reportedly bit her tongue when she had a seizure.

## 2021-04-02 NOTE — Progress Notes (Signed)
EEG done at bedside. Results pending.  

## 2021-04-02 NOTE — H&P (Signed)
History and Physical    SHAMS FILL PJA:[id number] DOB: May 24, 1951 DOA: 04/01/2021  PCP: Vernie Shanks, MD   Patient coming from: Home  I have personally briefly reviewed patient's old medical records in [place]  CC: N/V, seizure HPI: 70 yo WF with hx of afib, on Eliquis, HTN, DM2, OSA, obesity, presented to ER with N/V for about 1 week.  Patient recently started on Ozempic for diabetes.  She has had nausea vomiting with her first 2 doses.  Reportedly she had her third dose and started having violent vomiting.  She is unable to keep anything down.  She was seen in the ER 2 days ago and started on IV fluids.  She discharged home.  She came back today with recurrent nausea vomiting.  She is unable to keep anything down the ER.  She started IV fluids.  Lab evaluation there showed a potassium of 2.8, BUN of 5 creatinine 0.68.  Patient was on schedule for transfer to [facility] due to continued nausea vomiting.  Patient had a tonic-clonic seizure while still at the [facility] ER.  Serum magnesium was checked and was less than 0.5.  Patient does have a history of hypomagnesemia.  She has has a history of GERD and has been on Nexium for years.  Patient was given 2 g of IV magnesium.  Patient is postictal and cannot give any accurate review of systems or history.  She is confused.  She is not oriented to date.  She knows she is in the hospital but does not know which Hospital she is at  EKG demonstrated prolonged QT with a QTC of 512 ms.  Patient reportedly bit her tongue when she had a seizure.   ED Course: noted to have intractable N/V despite iv antiemetics. Started on IVF. While awaiting transfer to [facility], pt had seizure. Serum magnesium < 0.5. started on IV mag. Pt had bit her tongue during seizure.  Review of Systems:  Review of Systems  Unable to perform ROS: Other  Pt is post-ictal. Cannot recall going to ER today.  Past Medical History:  Diagnosis Date   Atrial  fibrillation ([place])    Back pain    Diabetes mellitus    Dysrhythmia    history Atrial Flutter. 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   Obesity    Restless legs    Sleep apnea    no cpap use now-unable to tolerate mask, surgery a yr ago   Vertigo     Past Surgical History:  Procedure Laterality Date   APPENDECTOMY     BUNIONECTOMY     ESOPHAGOGASTRODUODENOSCOPY (EGD) WITH PROPOFOL N/A 08/16/2015   Procedure: ESOPHAGOGASTRODUODENOSCOPY (EGD) WITH PROPOFOL;  Surgeon: Garlan Fair, MD;  Location: [facility];  Service: Endoscopy;  Laterality: N/A;   GANGLION CYST   1980   REMOVED    HEMORROIDECTOMY     THYROIDECTOMY, PARTIAL  2005   "partial for growth-benign"   UPPER ENDOSCOPIC ULTRASOUND W/ FNA     UVULECTOMY     excised about a yr ago [facility]     reports that she has never smoked. She has never used smokeless tobacco. She reports that she  does not drink alcohol and does not use drugs.  Allergies  Allergen Reactions   Doxycycline Diarrhea, Nausea Only and Nausea And Vomiting    Other reaction(s): Abdominal Pain   Yeast-Related Products    Molds & Smuts Cough   Penicillins Itching    Has patient had a PCN reaction causing immediate rash, facial/tongue/throat swelling, SOB or lightheadedness with hypotension: No Has patient had a PCN reaction causing severe rash involving mucus membranes or skin necrosis: No Has patient had a PCN reaction that required hospitalization No Has patient had a PCN reaction occurring within the last 10 years: No If all of the above answers are "NO", then may proceed with Cephalosporin use.   Sulfa Drugs Cross Reactors Itching    Family History  Problem Relation Age of Onset   Cancer Mother  59       PERITONEAL CANCER   Heart disease Mother    Diabetes Father        unsure of complete medical hx - not seen since her 29s   Hepatitis C Brother    Drug abuse Brother     Prior to Admission medications   Medication Sig Start Date End Date Taking? Authorizing Provider  apixaban (ELIQUIS) 5 MG TABS tablet Take 1 tablet (5 mg total) by mouth 2 (two) times daily. 09/22/20   Martinique, Peter M, MD  atorvastatin (LIPITOR) 20 MG tablet Take 20 mg by mouth daily. 08/03/15   [provider]  cefdinir (OMNICEF) 300 MG capsule Take 300 mg by mouth 2 (two) times daily. 08/30/20   [provider]  cyclobenzaprine (FLEXERIL) 10 MG tablet TAKE 1 TABLET BY MOUTH EVERYDAY AT BEDTIME. Call [phone] to schedule yearly follow up for ongoing refills 01/17/21   Sater, Nanine Means, MD  diltiazem (CARDIZEM CD) 120 MG 24 hr capsule Take 1 capsule (120 mg total) by mouth daily. 09/17/20 12/16/20  Martinique, Peter M, MD  fluticasone (FLONASE) 50 MCG/ACT nasal spray Place 2 sprays into both nostrils daily.    [provider]  Magnesium 400 MG TABS Take 400 mg by mouth daily.     [provider]  Melatonin 10 MG TABS Take 10 mg by mouth at bedtime.     [provider]  metFORMIN (GLUCOPHAGE-XR) 500 MG 24 hr tablet Take 1,000 mg by mouth 2 (two) times daily.     [provider]  metoprolol succinate (TOPROL-XL) 50 MG 24 hr tablet Take 1 tablet (50 mg total) by mouth daily. 09/09/20   Martinique, Peter M, MD  montelukast (SINGULAIR) 10 MG tablet Take 10 mg by mouth at bedtime.    [provider]  omeprazole (PRILOSEC) 40 MG capsule omeprazole 40 mg capsule,delayed release    [provider]  propafenone (RYTHMOL) 300 MG tablet Take 1 tablet (300 mg total) by mouth every 8 (eight) hours. 09/09/20   Martinique, Peter M, MD  Semaglutide (OZEMPIC, 0.25 OR 0.5 MG/DOSE, [place]) Inject into the skin.    [provider]  traMADol (ULTRAM) 50 MG tablet Take 50 mg by  mouth 3 (three) times daily as needed (Pain).     [provider]  VITAMIN D PO Take 1 tablet by mouth daily.    [provider]    Physical Exam: Vitals:   04/01/21 2100 04/01/21 2130 04/01/21 2200 04/02/21 0018  BP: (!) 154/91 (!) 149/78 (!) 157/75   Pulse: 87 86 90   Resp: (!) 21 (!) 23 18 (!) 23  Temp:  98 F (36.7 C)  TempSrc:    Oral  SpO2: 96% 95% 97%   Weight:    96.6 kg  Height:    5\' 5"  (1.651 m)    Physical Exam Vitals and nursing note reviewed.  Constitutional:      General: She is not in acute distress.    Appearance: She is obese. She is not ill-appearing, toxic-appearing or diaphoretic.  HENT:     Head: Normocephalic and atraumatic.     Comments: Small bite mark on right lateral aspect of her tongue    Nose: Nose normal. No rhinorrhea.  Eyes:     Pupils: Pupils are equal, round, and reactive to light.  Cardiovascular:     Rate and Rhythm: Normal rate and regular rhythm.     Pulses: Normal pulses.  Pulmonary:     Effort: Pulmonary effort is normal. No respiratory distress.     Breath sounds: Normal breath sounds. No wheezing or rales.  Abdominal:     General: Bowel sounds are normal. There is no distension.     Palpations: Abdomen is soft.     Tenderness: There is no abdominal tenderness. There is no guarding.  Musculoskeletal:     Right lower leg: No edema.     Left lower leg: No edema.  Skin:    General: Skin is warm and dry.     Capillary Refill: Capillary refill takes less than 2 seconds.  Neurological:     Mental Status: She is alert. She is disoriented.     Deep Tendon Reflexes:     Reflex Scores:      Patellar reflexes are 3+ on the right side and 3+ on the left side.    Comments: Slightly hyperreflexic in her patellar reflexes.     Labs on Admission: I have personally reviewed following labs and imaging studies  CBC: Recent Labs  Lab 03/31/21 1542 04/01/21 1807 04/01/21 1825  WBC 14.4* 11.3*  --   NEUTROABS  --   8.3*  --   HGB 13.2 12.2 12.2  HCT 39.2 36.1 36.0  MCV 81.8 80.9  --   PLT 419* 350  --    Basic Metabolic Panel: Recent Labs  Lab 03/31/21 1542 04/01/21 1807 04/01/21 1825 04/01/21 2038  NA 138 138 136  --   K 3.4* 2.8* 2.6*  --   CL 97* 95*  --   --   CO2 25 26  --   --   GLUCOSE 180* 151*  --   --   BUN 6* 5*  --   --   CREATININE 0.83 0.68  --   --   CALCIUM 7.2* 6.9*  --   --   MG  --   --   --  <0.5*   GFR: Estimated Creatinine Clearance: 76.3 mL/min (by C-G formula based on SCr of 0.68 mg/dL). Liver Function Tests: Recent Labs  Lab 03/31/21 1542 04/01/21 1807  AST 18 19  ALT 13 13  ALKPHOS 82 73  BILITOT 0.5 0.5  PROT 7.2 6.9  ALBUMIN 4.3 4.0   Recent Labs  Lab 03/31/21 1542  LIPASE 17   No results for input(s): AMMONIA in the last 168 hours. Coagulation Profile: No results for input(s): INR, PROTIME in the last 168 hours. Cardiac Enzymes: No results for input(s): CKTOTAL, CKMB, CKMBINDEX, TROPONINI in the last 168 hours. BNP (last 3 results) No results for input(s): PROBNP in the last 8760 hours. HbA1C: No results for input(s): HGBA1C  in the last 72 hours. CBG: Recent Labs  Lab 04/01/21 2237  GLUCAP 184*   Lipid Profile: No results for input(s): CHOL, HDL, LDLCALC, TRIG, CHOLHDL, LDLDIRECT in the last 72 hours. Thyroid Function Tests: No results for input(s): TSH, T4TOTAL, FREET4, T3FREE, THYROIDAB in the last 72 hours. Anemia Panel: No results for input(s): VITAMINB12, FOLATE, FERRITIN, TIBC, IRON, RETICCTPCT in the last 72 hours. Urine analysis:    Component Value Date/Time   COLORURINE COLORLESS (A) 03/31/2021 2040   APPEARANCEUR CLEAR 03/31/2021 2040   LABSPEC 1.018 03/31/2021 2040   PHURINE 6.0 03/31/2021 2040   GLUCOSEU NEGATIVE 03/31/2021 2040   HGBUR TRACE (A) 03/31/2021 2040   BILIRUBINUR NEGATIVE 03/31/2021 2040   KETONESUR NEGATIVE 03/31/2021 2040   PROTEINUR NEGATIVE 03/31/2021 2040   NITRITE NEGATIVE 03/31/2021 2040    LEUKOCYTESUR TRACE (A) 03/31/2021 2040    Radiological Exams on Admission: I have personally reviewed images CT Head Wo Contrast  Result Date: 04/01/2021 CLINICAL DATA:  Status post seizure. EXAM: CT HEAD WITHOUT CONTRAST TECHNIQUE: Contiguous axial images were obtained from the base of the skull through the vertex without intravenous contrast. COMPARISON:  September 25, 2020 FINDINGS: Brain: No evidence of acute infarction, hemorrhage, hydrocephalus, extra-axial collection or mass lesion/mass effect. Vascular: No hyperdense vessel or unexpected calcification. Skull: Normal. Negative for fracture or focal lesion. Sinuses/Orbits: There is mild left ethmoid sinus mucosal thickening. Other: None. IMPRESSION: No acute intracranial abnormality. Electronically Signed   By: Virgina Norfolk M.D.   On: 04/01/2021 23:07   CT ABDOMEN PELVIS W CONTRAST  Result Date: 03/31/2021 CLINICAL DATA:  Diverticulitis suspected. Left lower quadrant abdominal pain. EXAM: CT ABDOMEN AND PELVIS WITH CONTRAST TECHNIQUE: Multidetector CT imaging of the abdomen and pelvis was performed using the standard protocol following bolus administration of intravenous contrast. CONTRAST:  121mL OMNIPAQUE IOHEXOL 300 MG/ML  SOLN COMPARISON:  03/15/2005 FINDINGS: Lower chest: Calcified granuloma in the left base. Lungs are otherwise clear. Hepatobiliary: Diffuse fatty infiltration of the liver. No focal lesions. Portal veins are patent. Gallbladder and bile ducts are unremarkable. Pancreas: Unremarkable. No pancreatic ductal dilatation or surrounding inflammatory changes. Spleen: Normal in size without focal abnormality. Adrenals/Urinary Tract: Adrenal glands are unremarkable. Kidneys are normal, without renal calculi, focal lesion, or hydronephrosis. Bladder is unremarkable. Stomach/Bowel: Stomach, small bowel, and colon are not abnormally distended. No wall thickening or inflammatory changes are appreciated. Surgical absence of the appendix.  Vascular/Lymphatic: Scattered aortic calcification. No aneurysm. No significant lymphadenopathy. Reproductive: Uterus and ovaries are not enlarged. No abnormal pelvic masses. Other: No abdominal wall hernia or abnormality. No abdominopelvic ascites. Musculoskeletal: No acute or significant osseous findings. IMPRESSION: 1. No acute process demonstrated in the abdomen or pelvis. No evidence of bowel obstruction or inflammation. 2. Diffuse fatty infiltration of the liver. 3. Mild aortic atherosclerosis. Electronically Signed   By: Lucienne Capers M.D.   On: 03/31/2021 20:15   DG Chest Port 1 View  Result Date: 04/01/2021 CLINICAL DATA:  Nausea and vomiting EXAM: PORTABLE CHEST 1 VIEW COMPARISON:  09/02/2014 FINDINGS: Check shadow is within normal limits. Aortic calcifications are noted. The lungs are clear bilaterally. Previously seen right hilar prominence is no longer identified. No bony abnormality is noted. IMPRESSION: No active disease. Electronically Signed   By: Inez Catalina M.D.   On: 04/01/2021 19:13    EKG: I have personally reviewed EKG: NSR, prolonged Qtc    Assessment/Plan Principal Problem:   Intractable nausea and vomiting Active Problems:   Seizure (HCC)   Hypomagnesemia  Hypokalemia   Atrial fibrillation (HCC)   Obesity   Type 2 diabetes mellitus with complication (HCC)   Hypertension   GERD (gastroesophageal reflux disease)   OSA (obstructive sleep apnea)    Intractable nausea and vomiting Patient admitted to progressive care.  Continue with IV fluids.  Cannot use any QT prolonging antinausea medications due to her prolonged QT.  May need to use IV Ativan if needed.  Seizure [facility]) Patient has no recorded history of seizure disorder.  Most likely cause of her seizure today was due to her extremely low magnesium level.  Hypomagnesemia Patient had nearly an undetectable magnesium level.  I am surprised she did not have torsades in her EKG.  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   We will continue with magnesium replacement.  She did receive 2 g of magnesium at the ER.  We will give her another 4 g of magnesium.  We will also replace her potassium.  We will check her potassium and her magnesium level this morning.  Patient has been on Nexium for many years now.  This could be the source of her hypomagnesemia.  We will need to switch her to Pepcid.  Hypokalemia We will give her another 2 rounds of IV potassium.  We will recheck her potassium level after her potassium and her magnesium have been replaced  Atrial fibrillation [facility]) Patient currently in sinus rhythm.  Is on metoprolol and Eliquis.  Obesity Chronic.  Type 2 diabetes mellitus with complication ([place]) Hold her Ozempic.  Start sliding scale insulin.  Hypertension Continue Lopressor.  GERD (gastroesophageal reflux disease) Start Pepcid.  Hold PPI.  OSA (obstructive sleep apnea) Stable.  DVT prophylaxis: Eliquis Code Status: Full Code Family Communication: no family at bedside  Disposition Plan: return home  Consults called: none  Admission status: Inpatient, Step Down Unit   Kristopher Oppenheim, DO [facility] 04/02/2021, 12:59 AM

## 2021-04-02 NOTE — Assessment & Plan Note (Addendum)
We will give her another 2 rounds of IV potassium.  We will recheck her potassium level after her potassium and her magnesium have been replaced

## 2021-04-02 NOTE — Progress Notes (Addendum)
Date and time results received: 04/02/21 6:12 AM   Test: Potassium level Critical Value: 2.6  Name of Provider Notified: Paulette Blanch, RN notified, to page Dr. Kristopher Oppenheim, D.O.  Orders Received? Or Actions Taken?: Awaiting orders,

## 2021-04-02 NOTE — Progress Notes (Signed)
Orders received, initiating 1st K+ run of 2.

## 2021-04-02 NOTE — Assessment & Plan Note (Signed)
Patient currently in sinus rhythm.  Is on metoprolol and Eliquis.

## 2021-04-02 NOTE — Assessment & Plan Note (Signed)
Start Pepcid.  Hold PPI.

## 2021-04-02 NOTE — Assessment & Plan Note (Signed)
Patient has no recorded history of seizure disorder.  Most likely cause of her seizure today was due to her extremely low magnesium level.

## 2021-04-02 NOTE — Assessment & Plan Note (Signed)
-  Stable overall -Continue current antihypertensive agents (Lopressor and Lasix). -Heart healthy/low-sodium diet discussed with patient. 

## 2021-04-03 ENCOUNTER — Inpatient Hospital Stay (HOSPITAL_COMMUNITY): Payer: Medicare Other

## 2021-04-03 DIAGNOSIS — E876 Hypokalemia: Secondary | ICD-10-CM | POA: Diagnosis not present

## 2021-04-03 DIAGNOSIS — R112 Nausea with vomiting, unspecified: Secondary | ICD-10-CM | POA: Diagnosis not present

## 2021-04-03 LAB — CBC WITH DIFFERENTIAL/PLATELET
Abs Immature Granulocytes: 0.04 10*3/uL (ref 0.00–0.07)
Basophils Absolute: 0 10*3/uL (ref 0.0–0.1)
Basophils Relative: 0 %
Eosinophils Absolute: 0.2 10*3/uL (ref 0.0–0.5)
Eosinophils Relative: 2 %
HCT: 34.1 % — ABNORMAL LOW (ref 36.0–46.0)
Hemoglobin: 11.1 g/dL — ABNORMAL LOW (ref 12.0–15.0)
Immature Granulocytes: 0 %
Lymphocytes Relative: 31 %
Lymphs Abs: 3 10*3/uL (ref 0.7–4.0)
MCH: 27.3 pg (ref 26.0–34.0)
MCHC: 32.6 g/dL (ref 30.0–36.0)
MCV: 84 fL (ref 80.0–100.0)
Monocytes Absolute: 0.7 10*3/uL (ref 0.1–1.0)
Monocytes Relative: 7 %
Neutro Abs: 5.8 10*3/uL (ref 1.7–7.7)
Neutrophils Relative %: 60 %
Platelets: 307 10*3/uL (ref 150–400)
RBC: 4.06 MIL/uL (ref 3.87–5.11)
RDW: 12.6 % (ref 11.5–15.5)
WBC: 9.8 10*3/uL (ref 4.0–10.5)
nRBC: 0 % (ref 0.0–0.2)

## 2021-04-03 LAB — BASIC METABOLIC PANEL
Anion gap: 9 (ref 5–15)
BUN: <5 mg/dL — ABNORMAL LOW (ref 8–23)
CO2: 23 mmol/L (ref 22–32)
Calcium: 7 mg/dL — ABNORMAL LOW (ref 8.9–10.3)
Chloride: 106 mmol/L (ref 98–111)
Creatinine, Ser: 0.69 mg/dL (ref 0.44–1.00)
GFR, Estimated: >60 mL/min (ref >60)
Glucose, Bld: 120 mg/dL — ABNORMAL HIGH (ref 70–99)
Potassium: 4.2 mmol/L (ref 3.5–5.1)
Sodium: 138 mmol/L (ref 135–145)

## 2021-04-03 LAB — MAGNESIUM: Magnesium: 1.6 mg/dL — ABNORMAL LOW (ref 1.7–2.4)

## 2021-04-03 LAB — GLUCOSE, CAPILLARY
Glucose-Capillary: 114 mg/dL — ABNORMAL HIGH (ref 70–99)
Glucose-Capillary: 119 mg/dL — ABNORMAL HIGH (ref 70–99)
Glucose-Capillary: 123 mg/dL — ABNORMAL HIGH (ref 70–99)
Glucose-Capillary: 129 mg/dL — ABNORMAL HIGH (ref 70–99)
Glucose-Capillary: 105 mg/dL — ABNORMAL HIGH (ref 70–99)

## 2021-04-03 MED ORDER — HYDROMORPHONE HCL 1 MG/ML IJ SOLN
INTRAMUSCULAR | Status: AC
  Filled 2021-04-03: qty 0.5

## 2021-04-03 MED ORDER — SODIUM CHLORIDE 0.9 % IV SOLN: INTRAVENOUS | Status: DC

## 2021-04-03 MED ORDER — DILTIAZEM HCL ER COATED BEADS 120 MG PO CP24
120.0000 mg | ORAL_CAPSULE | Freq: Every day | ORAL | Status: DC
  Administered 2021-04-03 – 2021-04-04 (×2): 120 mg via ORAL
  Filled 2021-04-03 (×2): qty 1

## 2021-04-03 MED ORDER — ALBUTEROL SULFATE (2.5 MG/3ML) 0.083% IN NEBU: 2.5000 mg | INHALATION_SOLUTION | Freq: Four times a day (QID) | RESPIRATORY_TRACT | Status: DC | PRN

## 2021-04-03 MED ORDER — ATORVASTATIN CALCIUM 10 MG PO TABS
20.0000 mg | ORAL_TABLET | Freq: Every day | ORAL | Status: DC
  Administered 2021-04-03 – 2021-04-04 (×2): 20 mg via ORAL
  Filled 2021-04-03 (×2): qty 2

## 2021-04-03 MED ORDER — NALOXONE HCL 0.4 MG/ML IJ SOLN
INTRAMUSCULAR | Status: AC
  Filled 2021-04-03: qty 1

## 2021-04-03 MED ORDER — HYDROMORPHONE HCL 1 MG/ML IJ SOLN: 1.0000 mg | Freq: Once | INTRAMUSCULAR | Status: DC | PRN

## 2021-04-03 MED ORDER — NALOXONE HCL 0.4 MG/ML IJ SOLN: 0.4000 mg | INTRAMUSCULAR | Status: DC | PRN

## 2021-04-03 MED ORDER — MAGNESIUM SULFATE 2 GM/50ML IV SOLN
2.0000 g | Freq: Once | INTRAVENOUS | Status: AC
  Administered 2021-04-03: 2 g via INTRAVENOUS
  Filled 2021-04-03: qty 50

## 2021-04-03 NOTE — Evaluation (Signed)
Clinical/Bedside Swallow Evaluation Patient Details  Name: Suzanne Greer MRN: [id number] Date of Birth: 1951-03-27  Today's Date: 04/03/2021 Time: SLP Start Time (ACUTE ONLY): 63 SLP Stop Time (ACUTE ONLY): 1531 SLP Time Calculation (min) (ACUTE ONLY): 16 min  Past Medical History:  Past Medical History:  Diagnosis Date   Atrial fibrillation ([place])    Back pain    Diabetes mellitus    Dysrhythmia    history Atrial Flutter. 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   Obesity    Restless legs    Sleep apnea    no cpap use now-unable to tolerate mask, surgery a yr ago   Vertigo    Past Surgical History:  Past Surgical History:  Procedure Laterality Date   APPENDECTOMY     BUNIONECTOMY     ESOPHAGOGASTRODUODENOSCOPY (EGD) WITH PROPOFOL N/A 08/16/2015   Procedure: ESOPHAGOGASTRODUODENOSCOPY (EGD) WITH PROPOFOL;  Surgeon: Garlan Fair, MD;  [facility]: [facility];  Service: Endoscopy;  Laterality: N/A;   GANGLION CYST   1980   REMOVED    HEMORROIDECTOMY     THYROIDECTOMY, PARTIAL  2005   "partial for growth-benign"   UPPER ENDOSCOPIC ULTRASOUND W/ FNA     UVULECTOMY     excised about a yr ago [facility]   HPI:  This is a 70 year old lady with a history of atrial fibrillation on Eliquis, hypertension and other comorbidities who was admitted to hospital service secondary to nausea vomiting and actually had 1 episode of witnessed seizure in the ED as well.  She was found to have significantly low magnesium.  Seizure was thought to be secondary to severe hypomagnesemia.  She was admitted under hospitalist service.  Code stroke called and cancelled 10/16 AM.  MRI negative 10/16.    Assessment / Plan / Recommendation  Clinical  Impression  Pt presents with mild risk of aspiration in setting of AMS.  Code stroke called this morning and canceled.  Negative MRI.  With thin liquid by serial straw sip, there was immediate cough.  Pt endorsed water making her cough and stated that she was trying to talk before having finished drinking.  Pt was given further trials challenging thin liquids. Pt states that she very much likes to use a straw when drinking.  Intermittent throat clear was noted following serial straw sips of thin liquid.  With single straw sips, throat clearing was not observed.  Pt tolerated puree and regular solids with no clinical s/s of aspiration and exhibited good oral clearance of solids.    Recommend regular texture diet with thin liquids.  Pt may have straws, but should only take single sips.  Pt reports some word finding difficulty.  This was observed in conversation and pt called attention to it when she said "sheets" for "rails" while discussing bed set up.  Pt also endorses amnesia for recent events precipitating admission.  Formal cognitive-linguistic evaluation pending.  Of note: On arrival SLP greeted pt and turned to get PPE.  While retrieving gloves and crash was heard and SLP turned to find pt on floor on her bottom.  Unit RN and NT came into room to assist pt getting up and back into bed.  Pt stated only  that her little toe hurt and she is otherwise fine.   SLP Visit Diagnosis: Dysphagia, oropharyngeal phase (R13.12)    Aspiration Risk  Mild aspiration risk    Diet Recommendation Regular;Thin liquid   Liquid Administration via: Cup;Straw (Single sips) Compensations: Slow rate;Small sips/bites (SINGLE SIPS) Postural Changes: Seated upright at 90 degrees    Other  Recommendations Oral Care Recommendations: Oral care BID    Recommendations for follow up therapy are one component of a multi-disciplinary discharge planning process, led by the attending physician.  Recommendations may be updated  based on patient status, additional functional criteria and insurance authorization.  Follow up Recommendations  (TBD)      Frequency and Duration min 2x/week  2 weeks       Prognosis Prognosis for Safe Diet Advancement: Good      Swallow Study   General HPI: This is a 71 year old lady with a history of atrial fibrillation on Eliquis, hypertension and other comorbidities who was admitted to hospital service secondary to nausea vomiting and actually had 1 episode of witnessed seizure in the ED as well.  She was found to have significantly low magnesium.  Seizure was thought to be secondary to severe hypomagnesemia.  She was admitted under hospitalist service.  Code stroke called and cancelled 10/16 AM.  MRI negative 10/16. Type of Study: Bedside Swallow Evaluation Previous Swallow Assessment: None Diet Prior to this Study: NPO Temperature Spikes Noted: No Respiratory Status: Room air History of Recent Intubation: No Behavior/Cognition: Alert;Cooperative;Pleasant mood Oral Cavity Assessment: Within Functional Limits Oral Care Completed by SLP: No Oral Cavity - Dentition: Adequate natural dentition Vision: Functional for self-feeding Self-Feeding Abilities: Able to feed self Patient Positioning: Upright in bed Baseline Vocal Quality: Normal Volitional Cough: Strong Volitional Swallow: Able to elicit    Oral/Motor/Sensory Function Overall Oral Motor/Sensory Function: Within functional limits Facial ROM: Within Functional Limits Facial Symmetry: Within Functional Limits Lingual ROM: Within Functional Limits Lingual Symmetry: Within Functional Limits Lingual Strength: Within Functional Limits Velum: Within Functional Limits Mandible: Within Functional Limits   Ice Chips Ice chips: Not tested   Thin Liquid Thin Liquid: Impaired Pharyngeal  Phase Impairments: Cough - Immediate;Throat Clearing - Delayed    Nectar Thick Nectar Thick Liquid: Not tested   Honey Thick Honey Thick  Liquid: Not tested   Puree Puree: Within functional limits Presentation: Self Fed;Spoon   Solid     Solid: Within functional limits Presentation: [place] , [place], [place] Office: [phone]; Pager [phone] 04/03/2021,4:00 PM

## 2021-04-03 NOTE — Consult Note (Addendum)
NEUROLOGY CONSULTATION NOTE   Date of service: April 03, 2021 Patient Name: Suzanne Greer MRN:  [id number] DOB:  1951/04/22 Reason for consult: "stroke code for potential aphasia." Requesting Provider: Darliss Cheney, MD _ _ _   _ __   _ __ _ _  __ __   _ __   __ _  History of Present Illness  Suzanne Greer is a 70 y.o. female with PMH significant for afibb on Eliquis, GERd, HLD, OSA, Meniere's disease, DM2, HTN who is admitted with recurrent vomiting, unable to tolerate PO intake with significant electrolyte derangement.  She had 2 seizures in the [facility] ED pending transfer to [facility]. Seizure thought to be due to significant hypomagnesemia. rEEG negative.  Today, patient as ntoed to have some intermittent difficulty with finding words and a stroke code was activated.   mRS: 0 tNK/thrombectomy: not offered 2/2 low suspicion for stroke. NIHSS components Score: Comment  1a Level of Conscious 0[x]  1[]  2[]  3[]      1b LOC Questions 0[x]  1[]  2[]       1c LOC Commands 0[x]  1[]  2[]       2 Best Gaze 0[x]  1[]  2[]       3 Visual 0[x]  1[]  2[]  3[]      4 Facial Palsy 0[x]  1[]  2[]  3[]      5a Motor Arm - left 0[x]  1[]  2[]  3[]  4[]  UN[]    5b Motor Arm - Right 0[x]  1[]  2[]  3[]  4[]  UN[]    6a Motor Leg - Left 0[x]  1[]  2[]  3[]  4[]  UN[]    6b Motor Leg - Right 0[x]  1[]  2[]  3[]  4[]  UN[]    7 Limb Ataxia 0[x]  1[]  2[]  3[]  UN[]     8 Sensory 0[x]  1[]  2[]  UN[]      9 Best Language 0[x]  1[]  2[]  3[]      10 Dysarthria 0[x]  1[]  2[]  UN[]      11 Extinct. and Inattention 0[x]  1[]  2[]       TOTAL: 0      ROS   Constitutional Denies weight loss, fever and chills.   HEENT Denies changes in vision and hearing.   Respiratory Denies SOB and cough.   CV Denies palpitations and CP   GI Denies abdominal pain, nausea, vomiting and diarrhea.   GU Denies dysuria and urinary frequency.   MSK Denies myalgia and joint pain.   Skin Denies rash and pruritus.   Neurological Denies headache and syncope.   Psychiatric  Denies recent changes in mood. Denies anxiety and depression.    Past History   Past Medical History:  Diagnosis Date   Atrial fibrillation ([place])    Back pain    Diabetes mellitus    Dysrhythmia    history Atrial Flutter. 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   Obesity    Restless legs    Sleep apnea    no cpap use now-unable to tolerate mask, surgery a yr ago   Vertigo    Past Surgical History:  Procedure Laterality Date   APPENDECTOMY     BUNIONECTOMY     ESOPHAGOGASTRODUODENOSCOPY (EGD) WITH PROPOFOL N/A 08/16/2015   Procedure: ESOPHAGOGASTRODUODENOSCOPY (EGD) WITH PROPOFOL;  Surgeon: Garlan Fair, MD;  Location: [facility];  Service: Endoscopy;  Laterality: N/A;   GANGLION CYST   1980   REMOVED    HEMORROIDECTOMY     THYROIDECTOMY, PARTIAL  2005   "partial for growth-benign"   UPPER ENDOSCOPIC ULTRASOUND W/ FNA     UVULECTOMY     [facility] about a yr ago -[facility]   Family History  Problem Relation Age of Onset   Cancer Mother 72       PERITONEAL CANCER   Heart disease Mother    Diabetes Father        unsure of complete medical hx - not seen since her 50s   Hepatitis C Brother    Drug abuse Brother    Social History   Socioeconomic History   Marital status: Divorced    Spouse name: Not on file   Number of children: 1   Years of education: college   Highest education level: Bachelor's degree (e.g., BA, AB, BS)  Occupational History   Occupation: Disability  Tobacco Use   Smoking status: Never   Smokeless tobacco: Never  Substance and Sexual Activity   Alcohol use: No   Drug use: No   Sexual activity: Not on file  Other Topics Concern   Not on file  Social History Narrative   Lives at home alone.    Right-handed.   Occasional use of caffeine.   Social Determinants of Health   Financial Resource Strain: Not on file  Food Insecurity: Not on file  Transportation Needs: Not on file  Physical Activity: Not on file  Stress: Not on file  Social Connections: Not on file   Allergies  Allergen Reactions   Doxycycline Diarrhea, Nausea Only and Nausea And Vomiting    Other reaction(s): Abdominal Pain   Ozempic (0.25 Or 0.5 Mg-Dose) [Semaglutide(0.25 Or 0.5mg -Dos)] Nausea And Vomiting    Dry skin "Almost made me want to die"   Yeast-Related Products    M[employer] Smuts Cough   Penicillins Itching    Has patient had a PCN reaction causing immediate rash, facial/tongue/throat swelling, SOB or lightheadedness with hypotension: No Has patient had a PCN reaction causing severe rash involving mucus membranes or skin necrosis: No Has patient had a PCN reaction that required hospitalization No Has patient had a PCN reaction occurring within the last 10 years: No If all of the above answers are "NO", then may proceed with Cephalosporin use.   Sulfa Drugs Cross Reactors Itching    Medications   Medications Prior to Admission  Medication Sig Dispense Refill Last Dose   albuterol (VENTOLIN HFA) 108 (90 Base) MCG/ACT inhaler Inhale 2 puffs into the lungs every 6 (six) hours as needed for wheezing or shortness of breath.      apixaban (ELIQUIS) 5 MG TABS tablet Take 1 tablet (5 mg total) by mouth 2 (two) times daily. 180 tablet 3 04/01/2021 at 1000   atorvastatin (LIPITOR) 20 MG tablet Take 20 mg by mouth daily.   03/31/2021   cyclobenzaprine (FLEXERIL) 10 MG tablet TAKE 1 TABLET BY MOUTH EVERYDAY AT BEDTIME. Call [phone] to schedule yearly follow up for ongoing refills (Patient taking differently: Take 10 mg by mouth at bedtime as needed for muscle spasms. Call [phone] to schedule yearly follow up for ongoing refills) 15 tablet 0 Past Month   diltiazem (CARDIZEM CD) 120 MG 24 hr capsule Take  1 capsule (120 mg total) by mouth daily. 90 capsule 3 04/01/2021   fluticasone (FLONASE) 50 MCG/ACT nasal spray Place 2 sprays into both nostrils daily.  03/31/2021   Magnesium 400 MG TABS Take 400 mg by mouth daily.    Past Month   Melatonin 10 MG TABS Take 10 mg by mouth at bedtime.    03/31/2021   metFORMIN (GLUCOPHAGE-XR) 500 MG 24 hr tablet Take 1,000 mg by mouth 2 (two) times daily.    04/01/2021   metoprolol succinate (TOPROL-XL) 50 MG 24 hr tablet Take 1 tablet (50 mg total) by mouth daily. 90 tablet 3 04/01/2021 at 1000   montelukast (SINGULAIR) 10 MG tablet Take 10 mg by mouth at bedtime.   03/31/2021   omeprazole (PRILOSEC) 40 MG capsule Take 40 mg by mouth daily.   04/01/2021   propafenone (RYTHMOL) 300 MG tablet Take 1 tablet (300 mg total) by mouth every 8 (eight) hours. 270 tablet 3 04/01/2021 at bid   traMADol (ULTRAM) 50 MG tablet Take 50 mg by mouth 3 (three) times daily as needed (Pain).    04/01/2021   VITAMIN D PO Take 1 tablet by mouth daily.   Past Week   cefdinir (OMNICEF) 300 MG capsule Take 300 mg by mouth 2 (two) times daily. (Patient not taking: No sig reported)   Completed Course     Vitals   Vitals:   04/02/21 1919 04/02/21 2258 04/03/21 0252 04/03/21 0640  BP: (!) 116/51 128/69 130/63 (!) 151/73  Pulse: 81 77 78 79  Resp: 20 19 19 20   Temp: 98.2 F (36.8 C) 98.3 F (36.8 C) 98.1 F (36.7 C) 98.5 F (36.9 C)  TempSrc: Oral Oral Oral Oral  SpO2: 96% 95% 97% 96%  Weight:   97.8 kg   Height:         Body mass index is 35.88 kg/m.  Physical Exam   General: Laying comfortably in bed; in no acute distress.  HENT: Normal oropharynx and mucosa. Normal external appearance of ears and nose.  Neck: Supple, no pain or tenderness  CV: No JVD. No peripheral edema.  Pulmonary: Symmetric Chest rise. Normal respiratory effort.  Abdomen: Soft to touch, non-tender.  Ext: No cyanosis, edema, or deformity  Skin: No rash. Normal palpation of skin.    Musculoskeletal: Normal digits and nails by inspection. No clubbing.   Neurologic Examination  Mental status/Cognition: Alert, oriented to self, place, month but not to year, good attention.  Speech/language: Fluent, comprehension intact, object naming intact, repetition intact.  Cranial nerves:   CN II Pupils equal and reactive to light, no VF deficits    CN III,IV,VI EOM intact, no gaze preference or deviation, no nystagmus    CN V normal sensation in V1, V2, and V3 segments bilaterally    CN VII no asymmetry, no nasolabial fold flattening    CN VIII normal hearing to speech    CN IX & X normal palatal elevation, no uvular deviation    CN XI 5/5 head turn and 5/5 shoulder shrug bilaterally    CN XII midline tongue protrusion    Motor:  Muscle bulk: normal, tone normal, pronator drift none tremor none Mvmt Root Nerve  Muscle Right Left Comments  SA C5/6 Ax Deltoid 5 5   EF C5/6 Mc Biceps 5 5   EE C6/7/8 Rad Triceps 5 5   WF C6/7 Med FCR     WE C7/8 PIN ECU     F Ab C8/T1 U ADM/FDI 5 5   HF L1/2/3 Fem Illopsoas 5 5   KE L2/3/4 Fem Quad 5 5   DF L4/5 D Peron Tib Ant 5  5   PF S1/2 Tibial Grc/Sol 5 5    Reflexes:  Right Left Comments  Pectoralis      Biceps (C5/6) 1 1   Brachioradialis (C5/6) 1 1    Triceps (C6/7) 1 1    Patellar (L3/4) 1 1    Achilles (S1)      Hoffman      Plantar     Jaw jerk    Sensation:  Light touch intact   Pin prick    Temperature    Vibration   Proprioception    Coordination/Complex Motor:  - Finger to Nose intact BL - Heel to shin intact BL - Rapid alternating movement are normal - Gait: unsafe to asses given lightheadedness.  Labs   CBC:  Recent Labs  Lab 04/01/21 1807 04/01/21 1825 04/03/21 0111  WBC 11.3*  --  9.8  NEUTROABS 8.3*  --  5.8  HGB 12.2 12.2 11.1*  HCT 36.1 36.0 34.1*  MCV 80.9  --  84.0  PLT 350  --  182    Basic Metabolic Panel:  Lab Results  Component Value Date   NA 138 04/03/2021   K 4.2 04/03/2021    CO2 23 04/03/2021   GLUCOSE 120 (H) 04/03/2021   BUN <5 (L) 04/03/2021   CREATININE 0.69 04/03/2021   CALCIUM 7.0 (L) 04/03/2021   GFRNONAA >60 04/03/2021   GFRAA >90 09/02/2014   Lipid Panel: No results found for: LDLCALC HgbA1c:  Lab Results  Component Value Date   HGBA1C 7.2 (H) 04/02/2021   Urine Drug Screen: No results found for: LABOPIA, COCAINSCRNUR, LABBENZ, AMPHETMU, THCU, LABBARB  Alcohol Level No results found for: [facility]   MRI Brain: Personally reviewed and negative for an acute stroke.  rEEG:  No seizure or epileptiform disharges  Impression   SALIAH CRISP is a 70 y.o. female with PMH significant for afibb on Eliquis, GERd, HLD, OSA, Meniere's disease, DM2, HTN who is admitted with recurrent vomiting, unable to tolerate PO intake with significant electrolyte derangement and seizures x 2 in the ED thought to be due to hypomagnesemia. A stroke code was activated 2/2 concern for aphasia.   I do not think she has aphasia. Suspect that her word finding difficulty is probably due to difficulty with attention with her electrolyte derangement and poor po intake.   I do agree that her seizures are probably due to significant hypomagnesemia which is a rare but known cause of seizures, specially when it is significantly deranged. Seizure workup with rEEG with no epileptiform discharges.  Impression: Toxic metabolic encephalopathy 2/2 electrolyte derangement. Unlikely to be stroke.  Recommendations  - MRI Brain is negative for an acute stroke. I suspect that her word finding difficulty is probably due to difficulty with attention with her electrolyte derangement and poor PO intake. No further inpatient neurological workup recommended at this time. We will signoff. Please feel free to contact us with any questions or concerns. ______________________________________________________________________  Plan discussed with Dr. Doristine Bosworth with the Hospitalist team.  Thank you for  the opportunity to take part in the care of this patient. If you have any further questions, please contact the neurology consultation attending.  Signed,  [place] Pager Number [phone] _ _ _   _ __   _ __ _ _  __ __   _ __   __ _

## 2021-04-03 NOTE — Progress Notes (Signed)
Pt family is concerned pt doesn't have short term memory capacity to communicate care with health care providers. Family is requesting frequent updates with pt status and pt interventions.

## 2021-04-03 NOTE — Code Documentation (Signed)
Code Stroke initiated on pt at 0448 for aphasia and mildy slurred speech. NIH-2, CBG-114, [id number]. Decision was made to take pt for MRI instead of CT scan. MRI negative for stroke. Pt transported back to room by SWOT RN.

## 2021-04-03 NOTE — Progress Notes (Signed)
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

## 2021-04-03 NOTE — Evaluation (Signed)
Physical Therapy Evaluation Patient Details Name: Suzanne Greer MRN: [id number] DOB: March 02, 1951 Today's Date: 04/03/2021  History of Present Illness  Pt is a 70 y.o. female admitted 10/14 for severe hypomagnesemia. She presented to the ED with c/o intractable N&V and had a witnessed seizure in the ED. Code stroke called 10/16 AM due to aphasia and slurred speech. MRI negative. Neurology consult states seizure and slurred speech most likely due to hypomagnesemia. PMH: afib, on Eliquis, HTN, DM2, OSA, obesity, GERD, meniere's ds   Clinical Impression  Pt admitted with above diagnosis. PTA pt lived alone independent. On eval, she required min guard assist transfers, and min/min guard assist ambulation 150' with RW. Deficits noted in safety awareness and balance. BLE strength/coordination intact.  Pt will benefit from skilled PT to increase their independence and safety with mobility to allow discharge to the venue listed below.  Daughter and SIL present in room and confirm they are able to provide 24-hour assist upon initial return home.         Recommendations for follow up therapy are one component of a multi-disciplinary discharge planning process, led by the attending physician.  Recommendations may be updated based on patient status, additional functional criteria and insurance authorization.  Follow Up Recommendations Home health PT;Supervision/Assistance - 24 hour (24-hour assist initially)    Equipment Recommendations  None recommended by PT    Recommendations for Other Services       Precautions / Restrictions Precautions Precautions: Fall      Mobility  Bed Mobility Overal bed mobility: Modified Independent                  Transfers Overall transfer level: Needs assistance Equipment used: Ambulation equipment used Transfers: Sit to/from [facility] Sit to Stand: Min guard Stand pivot transfers: Min guard       General transfer comment:  assist for safety due to instability  Ambulation/Gait Ambulation/Gait assistance: Min assist;Min guard Gait Distance (Feet): 150 Feet Assistive device: Rolling walker (2 wheeled) Gait Pattern/deviations: Step-through pattern;Staggering left;Staggering right;Decreased stride length     General Gait Details: attempted gait without AD but pt very unsteady. RW improved stability and with cues for decreased speed able to amb with min guard assist.  Stairs            Wheelchair Mobility    Modified Rankin (Stroke Patients Only)       Balance Overall balance assessment: Needs assistance Sitting-balance support: No upper extremity supported;Feet supported Sitting balance-Leahy Scale: Good     Standing balance support: No upper extremity supported;Bilateral upper extremity supported;During functional activity Standing balance-Leahy Scale: Fair Standing balance comment: static stand without support, RW for amb, LOB with dynamic challenges                             Pertinent Vitals/Pain Pain Assessment: No/denies pain    Home Living Family/patient expects to be discharged to:: Private residence Living Arrangements: Alone Available Help at Discharge: Family;Friend(s);Available PRN/intermittently Type of Home: House Home Access: Stairs to enter Entrance Stairs-Rails: Left Entrance Stairs-Number of Steps: 4 Home Layout: One level Home Equipment: Walker - 2 wheels;Bedside commode;Shower seat      Prior Function Level of Independence: Independent         Comments: Drives. Grocery shops. Does need to use RW when having episode of vertigo (meniere's)     Hand Dominance        Extremity/Trunk Assessment  Upper Extremity Assessment Upper Extremity Assessment: Defer to OT evaluation    Lower Extremity Assessment Lower Extremity Assessment: Overall WFL for tasks assessed    Cervical / Trunk Assessment Cervical / Trunk Assessment: Normal   Communication   Communication: No difficulties  Cognition Arousal/Alertness: Awake/alert Behavior During Therapy: WFL for tasks assessed/performed Overall Cognitive Status: Impaired/Different from baseline Area of Impairment: Orientation;Attention;Memory;Safety/judgement;Problem solving;Awareness                 Orientation Level: Situation (thought she was at [facility]) Current Attention Level: Selective Memory: Decreased short-term memory   Safety/Judgement: Decreased awareness of safety;Decreased awareness of deficits Awareness: Emergent Problem Solving: Difficulty sequencing;Slow processing;Requires verbal cues General Comments: Daughter and SIL present in room to confirm cognitive changes.      General Comments General comments (skin integrity, edema, etc.): VSS on RA    Exercises     Assessment/Plan    PT Assessment Patient needs continued PT services  PT Problem List Decreased mobility;Decreased safety awareness;Decreased balance       PT Treatment Interventions Therapeutic activities;Gait training;Therapeutic exercise;Patient/family education;Stair training;Balance training;Functional mobility training    PT Goals (Current goals can be found in the Care Plan section)  Acute Rehab PT Goals Patient Stated Goal: home PT Goal Formulation: With patient/family Time For Goal Achievement: 04/17/21 Potential to Achieve Goals: Good    Frequency Min 3X/week   Barriers to discharge        Co-evaluation               AM-PAC PT "6 Clicks" Mobility  Outcome Measure Help needed turning from your back to your side while in a flat bed without using bedrails?: None Help needed moving from lying on your back to sitting on the side of a flat bed without using bedrails?: None Help needed moving to and from a bed to a chair (including a wheelchair)?: A Little Help needed standing up from a chair using your arms (e.g., wheelchair or bedside chair)?: A  Little Help needed to walk in hospital room?: A Little Help needed climbing 3-5 steps with a railing? : A Little 6 Click Score: 20    End of Session Equipment Utilized During Treatment: Gait belt Activity Tolerance: Patient tolerated treatment well Patient left: in chair;with call bell/phone within reach;with family/visitor present Nurse Communication: Mobility status PT Visit Diagnosis: Unsteadiness on feet (R26.81)    Time: 9417-4081 PT Time Calculation (min) (ACUTE ONLY): 38 min   Charges:   PT Evaluation $PT Eval Moderate Complexity: 1 Mod PT Treatments $Gait Training: 8-22 mins        Lorrin Goodell, PT  Office # [phone] Pager [phone]   Lorriane Shire 04/03/2021, 2:38 PM

## 2021-04-03 NOTE — Significant Event (Signed)
Rapid Response Event Note   Reason for Call :  Aphasia and slurred speech  Initial Focused Assessment:  Pt lying in bed with eyes open, in no distress. She is alert and oriented, c/o 2/10 headache behind her L eye. She speech is mildly slurred and she is having trouble finding her words, NIH-2, [id number].  HR-77, BP-155/68, RR-23, SpO2-97% on 2L [place].  Interventions:  CBG-114 Code Stroke called at 0448-please see code stroke note for details Plan of Care:  MRI negative for stroke. Continue to monitor pt closely. Call RRT if further assistance needed.    Event Summary:   MD Notified: Dr. Lorrin Goodell, bedside RN to notify Dr. Marlowe Sax of happenings.  Call [place] WKGS:[phone] End RPRX:[phone]  Dillard Essex, RN

## 2021-04-03 NOTE — Progress Notes (Signed)
Mobility Specialist: Progress Note   04/03/21 1641  Mobility  Activity Ambulated in room;Transferred to/from BSC  Level of Assistance Contact guard assist, steadying assist  Assistive Device Front wheel walker  Distance Ambulated (ft) 20 ft  Mobility Out of bed for toileting  Mobility Response Tolerated fair  Mobility performed by Mobility specialist  Bed Position Chair  $Mobility charge 1 Mobility   Pt getting OOB to use BSC with assistance from family member upon entering room. Pt experienced LOB d/t fall risk mat x2 ambulating to [facility] requiring minA to correct, RN and NT notified. Pt to chair after walk with call bell at her side and family present in the room. Pt does not have chair alarm, RN and NT notified.   [facility] Bently [place]ath Mobility Specialist Mobility Specialist Phone: [phone]

## 2021-04-03 NOTE — Progress Notes (Addendum)
Occupational Therapy Evaluation  PTA pt lives alone independently. Daughter/SIL present during assessment. Pt had fallen earlier when Speech Therapy was in the room and did not recall specifics of fall. Pt unsteady with unassisted ambulation/mobility, requiring up to mod A to regain balance during dynamic activity to prevent fall. Pt with apparent cognitive deficits as indicated by scoring a 6/28 on the short Blessed Test (0-4 normal; 5-9 impaired; over 10 significant impairment) and poor insight/awareness of deficits. Pt will need to progress in order to DC home safely. Daughter states they will only be able to provide initial assistance after DC as they live in [facility]. At this time pt needs direct assistance with all mobility, ADL and IADL tasks. Of concern is pt's inability to drive given her seizures in the ED. Will follow acutely and further assess higher executive level skills and medication management ability with The Pillbox Test.  Pt may need short term SNF given concern for support after DC.    04/03/21 1600  OT Visit Information  Last OT Received On 04/03/21  Assistance Needed +1  History of Present Illness Pt is a 70 y.o. female admitted 10/14 for severe hypomagnesemia. She presented to the ED with c/o intractable N&V and had a witnessed seizure in the ED. Code stroke called 10/16 AM due to aphasia and slurred speech. MRI negative. Neurology consult states seizure and slurred speech most likely due to hypomagnesemia. PMH: afib, on Eliquis, HTN, DM2, OSA, obesity, GERD, meniere's ds  Precautions  Precautions Fall  Home Living  Family/patient expects to be discharged to: Private residence  Living Arrangements Alone  Available Help at Discharge Family;Friend(s);Available PRN/intermittently  Type of Home House  Home Access Stairs to enter  Entrance Stairs-Number of Steps 4  Entrance Stairs-Rails Left  Home Layout One level  Bathroom Shower/Tub Walk-in shower  Bathroom Toilet Handicapped  height  Bathroom Accessibility Yes  How Accessible Accessible via walker  [place] - 2 wheels;BSC;Shower seat;Grab bars - tub/shower  Prior Function  Level of Independence Independent  Comments Drives. Grocery shops. Does need to use RW when having episode of vertigo (meniere's)  Communication  Communication Expressive difficulties (word substitutions noted; "slurred speech " at times)  Pain Assessment  Pain Assessment Faces  Faces Pain Scale 0  Cognition  Arousal/Alertness Awake/alert  Behavior During Therapy WFL for tasks assessed/performed;Impulsive  Overall Cognitive Status Impaired/Different from baseline  Area of Impairment Attention;Memory;Safety/judgement;Problem solving;Awareness  Orientation Level  (thought she was at [facility])  Current Attention Level Selective  Memory Decreased short-term memory  Safety/Judgement Decreased awareness of safety;Decreased awareness of deficits  Awareness Emergent  Problem Solving Slow processing;Requires verbal cues  General Comments Daughter and SIL present in room to confirm cognitive changes. Assessed with the Short Blessed Test. Pt scored a 6/28 with places her in the Impaired category - difficulty recalling months of the year in reverse order adn unableto recall 2/5 wrods on name/address recall. Decreased awareness and insight into deficits; Fell with ST and does not remember that she fell onto the floor.  Upper Extremity Assessment  Upper Extremity Assessment Overall WFL for tasks assessed  Lower Extremity Assessment  Lower Extremity Assessment Defer to PT evaluation  Cervical / Trunk Assessment  Cervical / Trunk Assessment Normal  ADL  Overall ADL's  Needs assistance/impaired  Eating/Feeding Modified independent  Grooming Set up;Sitting  Upper Body Bathing Set up;Sitting  Lower Body Bathing Minimal assistance;Sit to/from stand  Upper Body Dressing  Minimal assistance;Sitting  Lower Body Dressing  Minimal  assistance;Sit to/from Retail buyer Minimal assistance  Toileting- Clothing Manipulation and Hygiene Minimal assistance  Functional mobility during ADLs Moderate assistance  General ADL Comments LOB during mobility requiring Mod a to prevent fall  Vision- History  Baseline Vision/History 1 Wears glasses (reading)  Vision- Assessment  Vision Assessment? Yes;No apparent visual deficits  Eye Alignment WFL  Bed Mobility  Overal bed mobility Modified Independent  Transfers  Overall transfer level Needs assistance  Transfers Sit to/from Stand;Stand Pivot Transfers  Sit to Stand Min assist  General transfer comment mod A required when taking step forward/backward to prevent fall  Balance  Overall balance assessment Needs assistance  Sitting-balance support No upper extremity supported;Feet supported  Sitting balance-Leahy Scale Good  Standing balance support No upper extremity supported;Bilateral upper extremity supported;During functional activity  Standing balance-Leahy Scale Poor  General Comments  General comments (skin integrity, edema, etc.) VSS  OT - End of Session  Equipment Utilized During Treatment Gait belt  Activity Tolerance Patient tolerated treatment well  Patient left in bed;with call bell/phone within reach;with bed alarm set;with family/visitor present  Nurse Communication Mobility status  OT Assessment  OT Recommendation/Assessment Patient needs continued OT Services  OT Visit Diagnosis Unsteadiness on feet (R26.81);Other abnormalities of gait and mobility (R26.89);Muscle weakness (generalized) (M62.81);Other symptoms and signs involving cognitive function  OT Problem List Decreased activity tolerance;Impaired balance (sitting and/or standing);Decreased cognition;Decreased safety awareness;Decreased knowledge of use of DME or AE  OT Plan  OT Frequency (ACUTE ONLY) Min 2X/week  OT Treatment/Interventions (ACUTE ONLY) Self-care/ADL training;Therapeutic  exercise;DME and/or AE instruction;Therapeutic activities;Cognitive remediation/compensation;Patient/family education;Balance training  AM-PAC OT "6 Clicks" Daily Activity Outcome Measure (Version 2)  Help from another person eating meals? 4  Help from another person taking care of personal grooming? 3  Help from another person toileting, which includes using toliet, bedpan, or urinal? 3  Help from another person bathing (including washing, rinsing, drying)? 3  Help from another person to put on and taking off regular upper body clothing? 3  Help from another person to put on and taking off regular lower body clothing? 3  6 Click Score 19  Progressive Mobility  What is the highest level of mobility based on the progressive mobility assessment? Level 4 (Walks with assist in room) - Balance while marching in place and cannot step forward and back - Complete  Mobility Out of bed for toileting;Out of bed to chair with meals  OT Recommendation  Follow Up Recommendations Home health OT (pending progress; direct S wtih mobility, ADL adn medication/financial management)  OT Equipment None recommended by OT  Individuals Consulted  Consulted and Agree with Results and Recommendations Patient;Family member/caregiver  Family Member Consulted daughter  Acute Rehab OT Goals  Patient Stated Goal home  OT Goal Formulation With patient/family  Time For Goal Achievement 04/17/21  Potential to Achieve Goals Good  OT Time Calculation  OT Start Time (ACUTE ONLY) 1535  OT Stop Time (ACUTE ONLY) 1558  OT Time Calculation (min) 23 min  OT General Charges  $OT Visit 1 Visit  OT Evaluation  $OT Eval Moderate Complexity 1 Mod  OT Treatments  $Self Care/Home Management  8-22 mins  Written Expression  Dominant Hand Right  Maurie Boettcher, OT/L   Acute OT Clinical Specialist Acute Rehabilitation Services Pager [phone] Office [phone]

## 2021-04-03 NOTE — Progress Notes (Signed)
PROGRESS NOTE    Suzanne Greer  ENI:[id number] DOB: 10/01/1950 DOA: 04/01/2021 PCP: Vernie Shanks, MD   Brief Narrative:  This is a 70 year old lady with a history of atrial fibrillation on Eliquis, hypertension and other comorbidities who was admitted to hospital service secondary to nausea vomiting and actually had 1 episode of witnessed seizure in the ED as well.  She was found to have significantly low magnesium.  Seizure was thought to be secondary to severe hypomagnesemia.  She was admitted under hospitalist service.  Assessment & Plan:   Principal Problem:   Intractable nausea and vomiting Active Problems:   Atrial fibrillation (HCC)   Obesity   Type 2 diabetes mellitus with complication (HCC)   Hypertension   GERD (gastroesophageal reflux disease)   OSA (obstructive sleep apnea)   Seizure (HCC)   Hypomagnesemia   Hypokalemia   Intractable vomiting with nausea  Intractable nausea and vomiting: Likely secondary to Ozempic.  Symptoms have resolved.  She is tolerating diet.  Seizure [facility]): Likely secondary to hypomagnesemia.  EEG negative.   Hypomagnesemia: Presented with severe hypomagnesemia which was replenished.  Low again to 1.6 today.  Will replace again.  Potassium normal.  Slurred speech/possible TIA: Early morning, patient had slurred speech and word finding difficulty.  Code stroke was called.  Patient underwent MRI which is unremarkable.  Patient symptoms resolved within an hour.  She was assessed by neurology.  Neurology opined that her symptoms were likely secondary to hypomagnesemia and they have signed off.  Currently patient has no focal deficit, her speech is fluent.  Discussed with her friend/sister over the phone and they claim that when they talk to the patient earlier on the phone, patient was still having some word finding difficulty but patient does not recall such event.  Discussed with daughter and friend and they are requesting to keep patient in  the hospital overnight for observation.  They also requested speech evaluation.  I have consulted SLP, PT and OT.   Hypokalemia: Resolved.   Atrial fibrillation [facility]) Patient currently in sinus rhythm.  Is on metoprolol and Eliquis.   Obesity Chronic.  Weight loss encouraged.   Type 2 diabetes mellitus with complication (HCC) controlled.  Continue SSI.   Hypertension: Controlled.  Continue Lopressor.   GERD (gastroesophageal reflux disease): Continue Pepcid.  OSA (obstructive sleep apnea) Stable.  DVT prophylaxis: SCDs Start: 04/02/21 0116   Code Status: Full Code  Family Communication:  None present at bedside.  Plan of care discussed with patient in length and he verbalized understanding and agreed with it.  Also discussed with her daughter and friend over the speaker phone while in her room.  Status is: Inpatient  Remains inpatient appropriate because: Needs therapy assessment and some more monitoring.  Estimated body mass index is 35.88 kg/m as calculated from the following:   Height as of this encounter: 5\' 5"  (1.651 m).   Weight as of this encounter: 97.8 kg.  Nutritional Assessment: Body mass index is 35.88 kg/m.Marland Kitchen Seen by dietician.  I agree with the assessment and plan as outlined below: Nutrition Status:   Skin Assessment: I have examined the patient's skin and I agree with the wound assessment as performed by the wound care RN as outlined below:    Consultants:  Neurology-signed off  Procedures:  None  Antimicrobials:  Anti-infectives (From admission, onward)    None          Subjective: Patient seen and examined.  She is alert and  oriented.  She has no complaints.  No word fine difficulty or any other strokelike symptoms.  Objective: Vitals:   04/02/21 1919 04/02/21 2258 04/03/21 0252 04/03/21 0640  BP: (!) 116/51 128/69 130/63 (!) 151/73  Pulse: 81 77 78 79  Resp: 20 19 19 20   Temp: 98.2 F (36.8 C) 98.3 F (36.8 C) 98.1 F (36.7 C)  98.5 F (36.9 C)  TempSrc: Oral Oral Oral Oral  SpO2: 96% 95% 97% 96%  Weight:   97.8 kg   Height:        Intake/Output Summary (Last 24 hours) at 04/03/2021 1050 Last data filed at 04/03/2021 0530 Gross per 24 hour  Intake --  Output 500 ml  Net -500 ml   Filed Weights   04/02/21 0018 04/02/21 0300 04/03/21 0252  Weight: 96.6 kg 96.6 kg 97.8 kg    Examination:  General exam: Appears calm and comfortable  Respiratory system: Clear to auscultation. Respiratory effort normal. Cardiovascular system: S1 & S2 heard, RRR. No JVD, murmurs, rubs, gallops or clicks. No pedal edema. Gastrointestinal system: Abdomen is nondistended, soft and nontender. No organomegaly or masses felt. Normal bowel sounds heard. Central nervous system: Alert and oriented. No focal neurological deficits. Extremities: Symmetric 5 x 5 power. Skin: No rashes, lesions or ulcers Psychiatry: Judgement and insight appear normal. Mood & affect appropriate.    Data Reviewed: I have personally reviewed following labs and imaging studies  CBC: Recent Labs  Lab 03/31/21 1542 04/01/21 1807 04/01/21 1825 04/03/21 0111  WBC 14.4* 11.3*  --  9.8  NEUTROABS  --  8.3*  --  5.8  HGB 13.2 12.2 12.2 11.1*  HCT 39.2 36.1 36.0 34.1*  MCV 81.8 80.9  --  84.0  PLT 419* 350  --  315   Basic Metabolic Panel: Recent Labs  Lab 03/31/21 1542 04/01/21 1807 04/01/21 1825 04/01/21 2038 04/02/21 0507 04/02/21 1322 04/03/21 0111  NA 138 138 136  --  137  --  138  K 3.4* 2.8* 2.6*  --  2.6* 3.8 4.2  CL 97* 95*  --   --  100  --  106  CO2 25 26  --   --  25  --  23  GLUCOSE 180* 151*  --   --  132*  --  120*  BUN 6* 5*  --   --  <5*  --  <5*  CREATININE 0.83 0.68  --   --  0.74  --  0.69  CALCIUM 7.2* 6.9*  --   --  7.0*  --  7.0*  MG  --   --   --  <0.5* 2.3  --  1.6*   GFR: Estimated Creatinine Clearance: 76.8 mL/min (by C-G formula based on SCr of 0.69 mg/dL). Liver Function Tests: Recent Labs  Lab  03/31/21 1542 04/01/21 1807  AST 18 19  ALT 13 13  ALKPHOS 82 73  BILITOT 0.5 0.5  PROT 7.2 6.9  ALBUMIN 4.3 4.0   Recent Labs  Lab 03/31/21 1542  LIPASE 17   No results for input(s): AMMONIA in the last 168 hours. Coagulation Profile: No results for input(s): INR, PROTIME in the last 168 hours. Cardiac Enzymes: No results for input(s): CKTOTAL, CKMB, CKMBINDEX, TROPONINI in the last 168 hours. BNP (last 3 results) No results for input(s): PROBNP in the last 8760 hours. HbA1C: Recent Labs    04/02/21 0507  HGBA1C 7.2*   CBG: Recent Labs  Lab 04/02/21 1144 04/02/21 1521 04/02/21  2118 04/03/21 0537 04/03/21 0645  GLUCAP 127* 88 106* 114* 119*   Lipid Profile: No results for input(s): CHOL, HDL, LDLCALC, TRIG, CHOLHDL, LDLDIRECT in the last 72 hours. Thyroid Function Tests: No results for input(s): TSH, T4TOTAL, FREET4, T3FREE, THYROIDAB in the last 72 hours. Anemia Panel: No results for input(s): VITAMINB12, FOLATE, FERRITIN, TIBC, IRON, RETICCTPCT in the last 72 hours. Sepsis Labs: No results for input(s): PROCALCITON, LATICACIDVEN in the last 168 hours.  Recent Results (from the past 240 hour(s))  Resp Panel by RT-PCR (Flu A&B, Covid) Nasopharyngeal Swab     Status: None   Collection Time: 04/01/21  8:53 PM   Specimen: Nasopharyngeal Swab; Nasopharyngeal(NP) swabs in vial transport medium  Result Value Ref Range Status   SARS Coronavirus 2 by RT PCR NEGATIVE NEGATIVE Final    Comment: (NOTE) SARS-CoV-2 target nucleic acids are NOT DETECTED.  The SARS-CoV-2 RNA is generally detectable in upper respiratory specimens during the acute phase of infection. The lowest concentration of SARS-CoV-2 viral copies this assay can detect is 138 copies/mL. A negative result does not preclude SARS-Cov-2 infection and should not be used as the sole basis for treatment or other patient management decisions. A negative result may occur with  improper specimen  collection/handling, submission of specimen other than nasopharyngeal swab, presence of viral mutation(s) within the areas targeted by this assay, and inadequate number of viral copies(<138 copies/mL). A negative result must be combined with clinical observations, patient history, and epidemiological information. The expected result is Negative.  Fact Sheet for Patients:  EntrepreneurPulse.com.au  Fact Sheet for Healthcare Providers:  IncredibleEmployment.be  This test is no t yet approved or cleared by the [place] FDA and  has been authorized for detection and/or diagnosis of SARS-CoV-2 by FDA under an Emergency Use Authorization (EUA). This EUA will remain  in effect (meaning this test can be used) for the duration of the COVID-19 declaration under Section 564(b)(1) of the Act, 21 U.S.C.section 360bbb-3(b)(1), unless the authorization is terminated  or revoked sooner.       Influenza A by PCR NEGATIVE NEGATIVE Final   Influenza B by PCR NEGATIVE NEGATIVE Final    Comment: (NOTE) The Xpert Xpress SARS-CoV-2/FLU/RSV plus assay is intended as an aid in the diagnosis of influenza from Nasopharyngeal swab specimens and should not be used as a sole basis for treatment. Nasal washings and aspirates are unacceptable for Xpert Xpress SARS-CoV-2/FLU/RSV testing.  Fact Sheet for Patients: EntrepreneurPulse.com.au  Fact Sheet for Healthcare Providers: IncredibleEmployment.be  This test is not yet approved or cleared by the [place] FDA and has been authorized for detection and/or diagnosis of SARS-CoV-2 by FDA under an Emergency Use Authorization (EUA). This EUA will remain in effect (meaning this test can be used) for the duration of the COVID-19 declaration under Section 564(b)(1) of the Act, 21 U.S.C. section 360bbb-3(b)(1), unless the authorization is terminated or revoked.  Performed at [facility], [address] [place], [place] [address]   MRSA Next Gen by PCR, Nasal     Status: None   Collection Time: 04/02/21 12:25 AM   Specimen: Nasal Mucosa; Nasal Swab  Result Value Ref Range Status   MRSA by PCR Next Gen NOT DETECTED NOT DETECTED Final    Comment: (NOTE) The GeneXpert MRSA Assay (FDA approved for NASAL specimens only), is one component of a comprehensive MRSA colonization surveillance program. It is not intended to diagnose MRSA infection nor to guide or monitor treatment for MRSA infections. Test  performance is not FDA approved in patients less than 70 years old. Performed at [facility], [place] [address] [place], [place] [address]       Radiology Studies: CT Head Wo Contrast  Result Date: 04/01/2021 CLINICAL DATA:  Status post seizure. EXAM: CT HEAD WITHOUT CONTRAST TECHNIQUE: Contiguous axial images were obtained from the base of the skull through the vertex without intravenous contrast. COMPARISON:  September 25, 2020 FINDINGS: Brain: No evidence of acute infarction, hemorrhage, hydrocephalus, extra-axial collection or mass lesion/mass effect. Vascular: No hyperdense vessel or unexpected calcification. Skull: Normal. Negative for fracture or focal lesion. Sinuses/Orbits: There is mild left ethmoid sinus mucosal thickening. Other: None. IMPRESSION: No acute intracranial abnormality. Electronically Signed   By: Virgina Norfolk M.D.   On: 04/01/2021 23:07   MR BRAIN WO CONTRAST  Result Date: 04/03/2021 CLINICAL DATA:  Headache behind the left.  Aphasia EXAM: MRI HEAD WITHOUT CONTRAST TECHNIQUE: Multiplanar, multiecho pulse sequences of the brain and surrounding structures were obtained without intravenous contrast. COMPARISON:  CT from 2 days ago FINDINGS: Brain: No acute infarction, hemorrhage, hydrocephalus, extra-axial collection or mass lesion. Age normal brain volume white matter appearance Vascular: Normal flow voids. Skull and upper  cervical spine: Normal marrow signal. Sinuses/Orbits: Bilateral cataract resection. Opacified left ethmoid air cell IMPRESSION: 1. Negative brain MRI. 2. Single opacified left ethmoid air cell which is progressed from April 2022 head CT. Electronically Signed   By: Jorje Guild M.D.   On: 04/03/2021 06:43   DG Chest Port 1 View  Result Date: 04/01/2021 CLINICAL DATA:  Nausea and vomiting EXAM: PORTABLE CHEST 1 VIEW COMPARISON:  09/02/2014 FINDINGS: Check shadow is within normal limits. Aortic calcifications are noted. The lungs are clear bilaterally. Previously seen right hilar prominence is no longer identified. No bony abnormality is noted. IMPRESSION: No active disease. Electronically Signed   By: Inez Catalina M.D.   On: 04/01/2021 19:13   EEG adult  Result Date: 04/02/2021 Greta Doom, MD     04/02/2021  2:37 PM History: 70 year old female being evaluated for possible seizure after an episode of aphasia Sedation: None Technique: This EEG was acquired with electrodes placed according to the International 10-20 electrode system (including Fp1, Fp2, F3, F4, C3, C4, P3, P4, O1, O2, T3, T4, T5, T6, A1, A2, Fz, Cz, Pz). The following electrodes were missing or displaced: none. Background: The background consists of intermixed alpha and beta activities. There is a well defined posterior dominant rhythm of 9-10 Hz that attenuates with eye opening. Sleep is recorded with normal appearing structures.  Photic stimulation: Physiologic driving is not performed  EEG Abnormalities: None Clinical Interpretation: This normal EEG is recorded in the waking and sleep state. There was no seizure or seizure predisposition recorded on this study. Please note that lack of epileptiform activity on EEG does not preclude the possibility of epilepsy. Roland Rack, MD [facility] [phone] If 7pm- 7am, please page neurology on call as listed in [place].    Scheduled Meds:  apixaban  5 mg Oral  BID   famotidine  20 mg Oral BID   HYDROmorphone       insulin aspart  0-15 Units Subcutaneous TID WC   insulin aspart  0-5 Units Subcutaneous QHS   metoprolol succinate  50 mg Oral Daily   naloxone       propafenone  300 mg Oral Q8H   Continuous Infusions:  lactated ringers 1,000 mL with potassium chloride 40 mEq infusion 125 mL/hr at  04/03/21 0319    LOS: 1 day   Time spent: 5 minutes  Darliss Cheney, MD [facility]  04/03/2021, 10:50 AM  Please page via [employer] and do not message via secure chat for anything urgent. Secure chat can be used for anything non urgent.  How to contact the [facility] Attending or Consulting provider [place] or covering provider during after hours [place], for this patient?  Check the care team in [facility] and look for a) attending/consulting [facility] provider listed and b) the [facility] team listed. Page or secure chat [phone]. Log into [email or web address] and use [place]'s universal password to access. If you do not have the password, please contact the hospital operator. Locate the [facility] provider you are looking for under [facility] and page to a number that you can be directly reached. If you still have difficulty reaching the provider, please page the [facility] (Director on Call) for the Hospitalists listed on amion for assistance.

## 2021-04-04 ENCOUNTER — Ambulatory Visit: Payer: Federal, State, Local not specified - PPO | Admitting: Registered"

## 2021-04-04 DIAGNOSIS — E876 Hypokalemia: Secondary | ICD-10-CM | POA: Diagnosis not present

## 2021-04-04 LAB — BASIC METABOLIC PANEL
Anion gap: 8 (ref 5–15)
BUN: 5 mg/dL — ABNORMAL LOW (ref 8–23)
CO2: 23 mmol/L (ref 22–32)
Calcium: 7.7 mg/dL — ABNORMAL LOW (ref 8.9–10.3)
Chloride: 106 mmol/L (ref 98–111)
Creatinine, Ser: 0.72 mg/dL (ref 0.44–1.00)
GFR, Estimated: >60 mL/min (ref >60)
Glucose, Bld: 130 mg/dL — ABNORMAL HIGH (ref 70–99)
Potassium: 4.3 mmol/L (ref 3.5–5.1)
Sodium: 137 mmol/L (ref 135–145)

## 2021-04-04 LAB — CBC WITH DIFFERENTIAL/PLATELET
Abs Immature Granulocytes: 0.06 10*3/uL (ref 0.00–0.07)
Basophils Absolute: 0 10*3/uL (ref 0.0–0.1)
Basophils Relative: 0 %
Eosinophils Absolute: 0.3 10*3/uL (ref 0.0–0.5)
Eosinophils Relative: 3 %
HCT: 35.9 % — ABNORMAL LOW (ref 36.0–46.0)
Hemoglobin: 11.7 g/dL — ABNORMAL LOW (ref 12.0–15.0)
Immature Granulocytes: 1 %
Lymphocytes Relative: 31 %
Lymphs Abs: 3.4 10*3/uL (ref 0.7–4.0)
MCH: 27.6 pg (ref 26.0–34.0)
MCHC: 32.6 g/dL (ref 30.0–36.0)
MCV: 84.7 fL (ref 80.0–100.0)
Monocytes Absolute: 0.9 10*3/uL (ref 0.1–1.0)
Monocytes Relative: 8 %
Neutro Abs: 6.2 10*3/uL (ref 1.7–7.7)
Neutrophils Relative %: 57 %
Platelets: 325 10*3/uL (ref 150–400)
RBC: 4.24 MIL/uL (ref 3.87–5.11)
RDW: 12.5 % (ref 11.5–15.5)
WBC: 10.8 10*3/uL — ABNORMAL HIGH (ref 4.0–10.5)
nRBC: 0 % (ref 0.0–0.2)

## 2021-04-04 LAB — GLUCOSE, CAPILLARY
Glucose-Capillary: 131 mg/dL — ABNORMAL HIGH (ref 70–99)
Glucose-Capillary: 129 mg/dL — ABNORMAL HIGH (ref 70–99)
Glucose-Capillary: 132 mg/dL — ABNORMAL HIGH (ref 70–99)

## 2021-04-04 LAB — MAGNESIUM: Magnesium: 1.9 mg/dL (ref 1.7–2.4)

## 2021-04-04 NOTE — Progress Notes (Signed)
Physical Therapy Treatment Patient Details Name: Suzanne Greer MRN: [id number] DOB: 1950/06/28 Today's Date: 04/04/2021   History of Present Illness Pt is a 70 y.o. female admitted 10/14 for severe hypomagnesemia. She presented to the ED with c/o intractable N&V and had a witnessed seizure in the ED. Code stroke called 10/16 AM due to aphasia and slurred speech. MRI negative. Neurology consult states seizure and slurred speech most likely due to hypomagnesemia. PMH: afib, on Eliquis, HTN, DM2, OSA, obesity, GERD, meniere's ds    PT Comments    Pt with much improved cognition and mobility. Ready for dc home with initial family support from PT standpoint.    Recommendations for follow up therapy are one component of a multi-disciplinary discharge planning process, led by the attending physician.  Recommendations may be updated based on patient status, additional functional criteria and insurance authorization.  Follow Up Recommendations  Home health PT;Supervision for mobility/OOB (initial supervision for mobility but only for 1-2 days likely)     Equipment Recommendations  None recommended by PT    Recommendations for Other Services       Precautions / Restrictions Precautions Precautions: Fall     Mobility  Bed Mobility               General bed mobility comments: Pt up on bsc    Transfers Overall transfer level: Modified independent Equipment used: None;Rolling walker (2 wheeled) Transfers: Sit to/from [facility] Sit to Stand: Modified independent (Device/Increase time) Stand pivot transfers: Modified independent (Device/Increase time)       General transfer comment: Pt steady to rise  Ambulation/Gait Ambulation/Gait assistance: Supervision;Min guard Gait Distance (Feet): 250 Feet Assistive device: Rolling walker (2 wheeled);1 person hand held assist;None Gait Pattern/deviations: Step-through pattern;Decreased stride length Gait velocity:  decr Gait velocity interpretation: 1.31 - 2.62 ft/sec, indicative of limited community ambulator General Gait Details: supervision for safety with rolling walker - pt with steady gait with walker. With hand held or no device pt min guard for safety   Stairs Stairs: Yes Stairs assistance: Min guard Stair Management: One rail Right;Forwards;Step to pattern Number of Stairs: 3     Wheelchair Mobility    Modified Rankin (Stroke Patients Only)       Balance Overall balance assessment: Needs assistance Sitting-balance support: No upper extremity supported;Feet supported Sitting balance-Leahy Scale: Good     Standing balance support: Bilateral upper extremity supported;During functional activity Standing balance-Leahy Scale: Fair                              Cognition Arousal/Alertness: Awake/alert Behavior During Therapy: WFL for tasks assessed/performed Overall Cognitive Status: Within Functional Limits for tasks assessed                                       Exercises     General Comments        Pertinent Vitals/Pain Pain Assessment: No/denies pain     Home Living                      Prior Function            PT Goals (current goals can now be found in the care plan section) Acute Rehab PT Goals Patient Stated Goal: home Progress towards PT goals: Goals met and updated - see care plan  Frequency    Min 3X/week      PT Plan Current plan remains appropriate    Co-evaluation              AM-PAC PT "6 Clicks" Mobility   Outcome Measure  Help needed turning from your back to your side while in a flat bed without using bedrails?: None Help needed moving from lying on your back to sitting on the side of a flat bed without using bedrails?: None Help needed moving to and from a bed to a chair (including a wheelchair)?: None Help needed standing up from a chair using your arms (e.g., wheelchair or bedside  chair)?: None Help needed to walk in hospital room?: A Little Help needed climbing 3-5 steps with a railing? : A Little 6 Click Score: 22    End of Session Equipment Utilized During Treatment: Gait belt Activity Tolerance: Patient tolerated treatment well Patient left: in bed;with call bell/phone within reach;with nursing/sitter in room;Other (comment) (OT present)   PT Visit Diagnosis: Unsteadiness on feet (R26.81)     Time: 7981-0254 PT Time Calculation (min) (ACUTE ONLY): 16 min  Charges:  $Gait Training: 8-22 mins                     [place] Pager [phone] Office [address] 04/04/2021, 5:03 PM

## 2021-04-04 NOTE — Progress Notes (Signed)
Occupational Therapy Treatment  Educated pt/daughter on home safety and strategies to reduce risk of falls. Assessed with The PillBox Test of executive level functioning and medication management. Pt passed without any errors in acceptable amount of time. Defer further OT to [facility]. Provided with MedAlert Fall alert sytem information.     04/04/21 1500  OT Visit Information  Last OT Received On 04/04/21  Assistance Needed +1  History of Present Illness Pt is a 70 y.o. female admitted 10/14 for severe hypomagnesemia. She presented to the ED with c/o intractable N&V and had a witnessed seizure in the ED. Code stroke called 10/16 AM due to aphasia and slurred speech. MRI negative. Neurology consult states seizure and slurred speech most likely due to hypomagnesemia. PMH: afib, on Eliquis, HTN, DM2, OSA, obesity, GERD, meniere's ds  Precautions  Precautions Fall  Pain Assessment  Pain Assessment Faces  Faces Pain Scale 0  Cognition  Arousal/Alertness Awake/alert  Behavior During Therapy WFL for tasks assessed/performed;Impulsive  General Comments Assessed with the Pillbox Test  Upper Extremity Assessment  Upper Extremity Assessment Overall WFL for tasks assessed  ADL  Overall ADL's  Needs assistance/impaired  Tub/Shower Transfer Details (indicate cue type and reason) recommend use of shower chair  General ADL Comments Educated on strategies to reduce risk of falls; home safety  Balance  Overall balance assessment Needs assistance  Sitting balance-Leahy Scale Good  Standing balance-Leahy Scale Fair  Transfers  Overall transfer level Modified independent  Exercises  Exercises Other exercises  Other Exercises  Other Exercises sit - stand x 7 wihtout fatigue  OT - End of Session  Equipment Utilized During Treatment Gait belt  Activity Tolerance Patient tolerated treatment well  Patient left in bed;with call bell/phone within reach;with family/visitor present  Nurse Communication Other  (comment) (DC needs)  OT Assessment/Plan  OT Plan Discharge plan remains appropriate  OT Visit Diagnosis Unsteadiness on feet (R26.81);Other abnormalities of gait and mobility (R26.89);Muscle weakness (generalized) (M62.81);Other symptoms and signs involving cognitive function  OT Frequency (ACUTE ONLY) Min 2X/week  Follow Up Recommendations Home health OT  OT Equipment Tub/shower seat  AM-PAC OT "6 Clicks" Daily Activity Outcome Measure (Version 2)  Help from another person eating meals? 4  Help from another person taking care of personal grooming? 3  Help from another person toileting, which includes using toliet, bedpan, or urinal? 3  Help from another person bathing (including washing, rinsing, drying)? 3  Help from another person to put on and taking off regular upper body clothing? 3  Help from another person to put on and taking off regular lower body clothing? 3  6 Click Score 19  Progressive Mobility  What is the highest level of mobility based on the progressive mobility assessment? Level 5 (Walks with assist in room/hall) - Balance while stepping forward/back and can walk in room with assist - Complete  OT Goal Progression  Progress towards OT goals Progressing toward goals  Acute Rehab OT Goals  Patient Stated Goal home  OT Goal Formulation With patient/family  Time For Goal Achievement 04/17/21  Potential to Achieve Goals Good  ADL Goals  Pt Will Perform Lower Body Bathing with modified independence;sit to/from stand  Pt Will Perform Lower Body Dressing with modified independence;sit to/from stand  Pt Will Transfer to Toilet with modified independence;ambulating  Pt Will Perform Toileting - Clothing Manipulation and hygiene with modified independence;sit to/from stand  Pt Will Perform Tub/Shower Transfer Shower transfer;ambulating;rolling walker;with supervision;3 in 1  Additional ADL Goal #1  Pt will score within passing range on thePillbox test to demonstrate aiblity to  safely manage medications  Additional ADL Goal #2 Pt will comlete 3 step trail making task in moderately distracting environment  OT Time Calculation  OT Start Time (ACUTE ONLY) 1547  OT Stop Time (ACUTE ONLY) 1609  OT Time Calculation (min) 22 min  OT General Charges  $OT Visit 1 Visit  OT Treatments  $Self Care/Home Management  8-22 mins  Maurie Boettcher, OT/L   Acute OT Clinical Specialist [place] Pager [phone] Office [phone]

## 2021-04-04 NOTE — Progress Notes (Addendum)
Inpatient Diabetes Program Recommendations  AACE/ADA: New Consensus Statement on Inpatient Glycemic Control (2015)  Target Ranges:  Prepandial:   less than 140 mg/dL      Peak postprandial:   less than 180 mg/dL (1-2 hours)      Critically ill patients:  140 - 180 mg/dL   Lab Results  Component Value Date   GLUCAP 131 (H) 04/04/2021   HGBA1C 7.2 (H) 04/02/2021    Review of Glycemic Control Results for Suzanne Greer, Suzanne Greer" (MRN [id number]) as of 04/04/2021 10:06  Ref. Range 04/03/2021 11:27 04/03/2021 15:58 04/03/2021 21:29 04/04/2021 06:15  Glucose-Capillary Latest Ref Range: 70 - 99 mg/dL 123 (H) 129 (H) 105 (H) 131 (H)   Diabetes history: Type 2 DM Outpatient Diabetes medications: Metformin 1000 mg BID Current orders for Inpatient glycemic control: Novolog 0-15 units TID & HS  Inpatient Diabetes Program Recommendations:    Noted consult.   Recommend carb modified if to remain inpatient. Will plan to see.   Addendum: Spoke with patient and family regarding outpatient diabetes management. Patient concerned regarding Ozempic, dosage and side effects.  Reviewed patient's current A1c of 7.2% indicating good glycemic control and being at goal. Explained what a A1c is and what it measures. Also reviewed goal A1c with patient, importance of good glucose control @ home, and blood sugar goals. Reviewed Ozempic and GLP drug class to include typical dosages, benefits, presentation of side effects and encouraged cessation of use. Patient to follow up with PCP and reviewed role of endocrinology. Patient currently wearing Freestyle Libre and reviewed recommended frequency of CBG checks and when to call MD. Answered patient questions.   Thanks, Bronson Curb, MSN, RNC-OB Diabetes Coordinator [phone] (8a-5p)   Thanks, Bronson Curb, MSN, RNC-OB Diabetes Coordinator [phone] (8a-5p)

## 2021-04-04 NOTE — TOC Transition Note (Addendum)
Transition of Care [facility]) - CM/SW Discharge Note   Patient Details  Name: Suzanne Greer MRN: [id number] Date of Birth: 1950/08/15  [facility] CM/SW Contact:  Zenon Mayo, RN Phone Number: 04/04/2021, 10:21 AM   Clinical Narrative:    Patient is for dc today, NCM spoke with daughter Carron Brazen and patient at bedside, offered choice for [facility] with Medicare.gov list, daughter chose [facility] for HHPT, [place], HHST.  NCM made referal to Amy with [facility], awaiting to hear back.  Daughter also states they would like to get a shower chair  and she will pay for it since patient does not have Medicaid.  She is ok with [employer] supplying this for patient.  Daughter will stay with patient a couple days then she will go back home to [place].  Daughter will transport patient home today.  [employer] will bring the shower stool up to patient room.  Amy with [facility] states she can take rerferral.  Soc will begin 24 to 48 hrs post dc.     Final next level of care: [place] Barriers to Discharge: No Barriers Identified   Patient Goals and CMS Choice Patient states their goals for this hospitalization and ongoing recovery are:: return home with [facility] CMS Medicare.gov Compare Post Acute Care list provided to:: Patient Represenative (must comment) Choice offered to / list presented to : Adult Children  Discharge Placement                       Discharge Plan and Services                DME Arranged: Shower stool DME Agency: [employer] Date DME Agency Contacted: 04/04/21 Time DME Agency Contacted: 51 Representative spoke with at DME Agency: Adela Lank HH Arranged: PT, OT, Speech Therapy HH Agency: [place] Date [place]: 04/04/21 Time [place]: 1021 Representative spoke with at [place]: Amy  Social Determinants of Health ([place]) Interventions     Readmission Risk Interventions No flowsheet data found.

## 2021-04-04 NOTE — Progress Notes (Signed)
RN went over d/c summary with pt and pt's family. Belongings with pt and pt's family, including shower chair for home. NT removed PIV and is transporting pt to a private vehicle where pt's daughter will transport pt home.

## 2021-04-04 NOTE — Discharge Summary (Addendum)
Physician Discharge Summary  Suzanne Greer UKG:[id number] DOB: 1950/10/01 DOA: 04/01/2021  PCP: Vernie Shanks, MD  Admit date: 04/01/2021 Discharge date: 04/04/2021 30 Day Unplanned Readmission Risk Score    Flowsheet Row ED to Hosp-Admission (Current) from 04/01/2021 in [facility]  30 Day Unplanned Readmission Risk Score (%) 17.99 Filed at 04/04/2021 0801       This score is the patient's risk of an unplanned readmission within 30 days of being discharged (0 -100%). The score is based on dignosis, age, lab data, medications, orders, and past utilization.   Low:  0-14.9   Medium: 15-21.9   High: 22-29.9   Extreme: 30 and above          Admitted From: Home Disposition: Home  Recommendations for Outpatient Follow-up:  Follow up with PCP in 1-2 weeks Please obtain BMP/CBC in one week Please follow up with your PCP on the following pending results: Unresulted Labs (From admission, onward)    None         Home Health: Yes Equipment/Devices: None  Discharge Condition: Stable CODE STATUS: Full code Diet recommendation: Cardiac/diabetic  Subjective: Seen and examined.  Daughter and son-in-law at the bedside.  Patient fully alert and oriented.  No complaints.  Patient also verbalizes that she is feeling much better and she is back to her baseline and she has fluent speech.  Brief/Interim Summary: This is a 70 year old lady with a history of atrial fibrillation on Eliquis, hypertension and other comorbidities who was admitted to hospital service secondary to nausea vomiting and actually had 1 episode of witnessed seizure in the ED as well.  She was found to have significantly low magnesium.  Seizure was thought to be secondary to severe hypomagnesemia.  She was admitted under hospitalist service.   Intractable nausea and vomiting: Likely secondary to Ozempic.  Symptoms have resolved.  She is tolerating diet.   Seizure [facility]): Likely secondary to  hypomagnesemia.  EEG negative.  Seizure was unwitnessed and happened in the ED at [facility].  Following his the [facility] description documented in ED physician Dr. Acquanetta Chain note.  11:15 PM Patient's magnesium is less than 0.05 and she was replaced with IV magnesium.  Patient was waiting on an admission bed when the nurse went and noticing she had some sinus tachycardia and she was snoring heavily.  However patient was suddenly altered.  She would not respond to verbal stimuli but would just look around.  There was no generalized shaking noted but patient has bitten her tongue and concerned that she had a seizure.  No prior history of seizure disorder.  Patient is not an alcohol user.  She is not on any benzodiazepines.  And she was given 1 mg of Ativan.  We will do a CT of her head to ensure no other acute pathology.  Vital signs remained stable.   Hypomagnesemia: Replenished and normal now.   Slurred speech/possible TIA: Early morning 04/03/2021, patient had slurred speech and word finding difficulty.  Code stroke was called.  Patient underwent MRI which is unremarkable.  Patient symptoms resolved shortly thereafter.  She was assessed by neurology.  Neurology opined that her symptoms were likely secondary to hypomagnesemia and they have signed off.  Currently patient has no focal deficit, her speech is fluent.  Per family's request, SLP was consulted yesterday.  They are going to visit patient again today.  Now that patient is fluent, I expect that she will not need any home health SLP.  She was also seen by PT OT who recommended home health PT OT which is ordered for her.   Hypokalemia: Resolved.   Atrial fibrillation [facility]) Patient currently in sinus rhythm.  Is on metoprolol and Eliquis.   Type 2 diabetes mellitus with complication (HCC) controlled.  Hemoglobin A1c only 7.2.  According to patient, she was on metformin 500 mg p.o. twice daily.  Her hemoglobin A1c just about 2 months ago was 7.8  and her PCP increased metformin to 1000 mg twice daily.  Patient will be 70 year old in about 6 months.  After that, her goal hemoglobin A1c would be to be between 7 and 8.  She is also saying that she is not much compliant with diabetic diet and that she will be compliant now as well.  If she does so and takes metformin at current dose, I believe her diabetes will be very well controlled so she does not need any other medications other than metformin.  Lengthy discussion took place between myself, patient's family/daughter and son-in-law.  I answered several questions and they are satisfied.  All parties are agreeable for discharge plan.  Discharge Diagnoses:  Principal Problem:   Intractable nausea and vomiting Active Problems:   Atrial fibrillation (HCC)   Obesity   Type 2 diabetes mellitus with complication (HCC)   Hypertension   GERD (gastroesophageal reflux disease)   OSA (obstructive sleep apnea)   Seizure (HCC)   Hypomagnesemia   Hypokalemia   Intractable vomiting with nausea    Discharge Instructions   Allergies as of 04/04/2021       Reactions   Doxycycline Diarrhea, Nausea Only, Nausea And Vomiting   Other reaction(s): Abdominal Pain   Ozempic (0.25 Or 0.5 Mg-dose) [semaglutide(0.25 Or 0.5mg -dos)] Nausea And Vomiting   Dry skin "Almost made me want to die"   Yeast-related Products    Molds & Smuts Cough   Penicillins Itching   Has patient had a PCN reaction causing immediate rash, facial/tongue/throat swelling, SOB or lightheadedness with hypotension: No Has patient had a PCN reaction causing severe rash involving mucus membranes or skin necrosis: No Has patient had a PCN reaction that required hospitalization No Has patient had a PCN reaction occurring within the last 10 years: No If all of the above answers are "NO", then may proceed with Cephalosporin use.   Sulfa Drugs Cross Reactors Itching        Medication List     STOP taking these medications     cefdinir 300 MG capsule Commonly known as: OMNICEF       TAKE these medications    albuterol 108 (90 Base) MCG/ACT inhaler Commonly known as: VENTOLIN HFA Inhale 2 puffs into the lungs every 6 (six) hours as needed for wheezing or shortness of breath.   atorvastatin 20 MG tablet Commonly known as: LIPITOR Take 20 mg by mouth daily.   cyclobenzaprine 10 MG tablet Commonly known as: FLEXERIL TAKE 1 TABLET BY MOUTH EVERYDAY AT BEDTIME. Call [phone] to schedule yearly follow up for ongoing refills What changed:  how much to take how to take this when to take this reasons to take this additional instructions   diltiazem 120 MG 24 hr capsule Commonly known as: CARDIZEM CD Take 1 capsule (120 mg total) by mouth daily.   Eliquis 5 MG Tabs tablet Generic drug: apixaban Take 1 tablet (5 mg total) by mouth 2 (two) times daily.   fluticasone 50 MCG/ACT nasal spray Commonly known as: FLONASE Place 2 sprays into  both nostrils daily.   Magnesium 400 MG Tabs Take 400 mg by mouth daily.   Melatonin 10 MG Tabs Take 10 mg by mouth at bedtime.   metFORMIN 500 MG 24 hr tablet Commonly known as: GLUCOPHAGE-XR Take 1,000 mg by mouth 2 (two) times daily.   metoprolol succinate 50 MG 24 hr tablet Commonly known as: TOPROL-XL Take 1 tablet (50 mg total) by mouth daily.   montelukast 10 MG tablet Commonly known as: SINGULAIR Take 10 mg by mouth at bedtime.   omeprazole 40 MG capsule Commonly known as: PRILOSEC Take 40 mg by mouth daily.   propafenone 300 MG tablet Commonly known as: RYTHMOL Take 1 tablet (300 mg total) by mouth every 8 (eight) hours.   traMADol 50 MG tablet Commonly known as: ULTRAM Take 50 mg by mouth 3 (three) times daily as needed (Pain).   VITAMIN D PO Take 1 tablet by mouth daily.               Durable Medical Equipment  (From admission, onward)           Start     Ordered   04/04/21 1021  For home use only DME Shower stool   Once        04/04/21 1020            Follow-up Information     Vernie Shanks, MD Follow up in 1 week(s).   Specialty: Family Medicine Contact information: [place] [address] [phone]                Allergies  Allergen Reactions   Doxycycline Diarrhea, Nausea Only and Nausea And Vomiting    Other reaction(s): Abdominal Pain   Ozempic (0.25 Or 0.5 Mg-Dose) [Semaglutide(0.25 Or 0.5mg -Dos)] Nausea And Vomiting    Dry skin "Almost made me want to die"   Yeast-Related Products    Molds & Smuts Cough   Penicillins Itching    Has patient had a PCN reaction causing immediate rash, facial/tongue/throat swelling, SOB or lightheadedness with hypotension: No Has patient had a PCN reaction causing severe rash involving mucus membranes or skin necrosis: No Has patient had a PCN reaction that required hospitalization No Has patient had a PCN reaction occurring within the last 10 years: No If all of the above answers are "NO", then may proceed with Cephalosporin use.   Sulfa Drugs Cross Reactors Itching    Consultations: Neurology   Procedures/Studies: CT Head Wo Contrast  Result Date: 04/01/2021 CLINICAL DATA:  Status post seizure. EXAM: CT HEAD WITHOUT CONTRAST TECHNIQUE: Contiguous axial images were obtained from the base of the skull through the vertex without intravenous contrast. COMPARISON:  September 25, 2020 FINDINGS: Brain: No evidence of acute infarction, hemorrhage, hydrocephalus, extra-axial collection or mass lesion/mass effect. Vascular: No hyperdense vessel or unexpected calcification. Skull: Normal. Negative for fracture or focal lesion. Sinuses/Orbits: There is mild left ethmoid sinus mucosal thickening. Other: None. IMPRESSION: No acute intracranial abnormality. Electronically Signed   By: Virgina Norfolk M.D.   On: 04/01/2021 23:07   MR BRAIN WO CONTRAST  Result Date: 04/03/2021 CLINICAL DATA:  Headache behind the left.  Aphasia EXAM:  MRI HEAD WITHOUT CONTRAST TECHNIQUE: Multiplanar, multiecho pulse sequences of the brain and surrounding structures were obtained without intravenous contrast. COMPARISON:  CT from 2 days ago FINDINGS: Brain: No acute infarction, hemorrhage, hydrocephalus, extra-axial collection or mass lesion. Age normal brain volume white matter appearance Vascular: Normal flow voids. Skull and upper  cervical spine: Normal marrow signal. Sinuses/Orbits: Bilateral cataract resection. Opacified left ethmoid air cell IMPRESSION: 1. Negative brain MRI. 2. Single opacified left ethmoid air cell which is progressed from April 2022 head CT. Electronically Signed   By: Jorje Guild M.D.   On: 04/03/2021 06:43   CT ABDOMEN PELVIS W CONTRAST  Result Date: 03/31/2021 CLINICAL DATA:  Diverticulitis suspected. Left lower quadrant abdominal pain. EXAM: CT ABDOMEN AND PELVIS WITH CONTRAST TECHNIQUE: Multidetector CT imaging of the abdomen and pelvis was performed using the standard protocol following bolus administration of intravenous contrast. CONTRAST:  136mL OMNIPAQUE IOHEXOL 300 MG/ML  SOLN COMPARISON:  03/15/2005 FINDINGS: Lower chest: Calcified granuloma in the left base. Lungs are otherwise clear. Hepatobiliary: Diffuse fatty infiltration of the liver. No focal lesions. Portal veins are patent. Gallbladder and bile ducts are unremarkable. Pancreas: Unremarkable. No pancreatic ductal dilatation or surrounding inflammatory changes. Spleen: Normal in size without focal abnormality. Adrenals/Urinary Tract: Adrenal glands are unremarkable. Kidneys are normal, without renal calculi, focal lesion, or hydronephrosis. Bladder is unremarkable. Stomach/Bowel: Stomach, small bowel, and colon are not abnormally distended. No wall thickening or inflammatory changes are appreciated. Surgical absence of the appendix. Vascular/Lymphatic: Scattered aortic calcification. No aneurysm. No significant lymphadenopathy. Reproductive: Uterus and ovaries  are not enlarged. No abnormal pelvic masses. Other: No abdominal wall hernia or abnormality. No abdominopelvic ascites. Musculoskeletal: No acute or significant osseous findings. IMPRESSION: 1. No acute process demonstrated in the abdomen or pelvis. No evidence of bowel obstruction or inflammation. 2. Diffuse fatty infiltration of the liver. 3. Mild aortic atherosclerosis. Electronically Signed   By: Lucienne Capers M.D.   On: 03/31/2021 20:15   DG Chest Port 1 View  Result Date: 04/01/2021 CLINICAL DATA:  Nausea and vomiting EXAM: PORTABLE CHEST 1 VIEW COMPARISON:  09/02/2014 FINDINGS: Check shadow is within normal limits. Aortic calcifications are noted. The lungs are clear bilaterally. Previously seen right hilar prominence is no longer identified. No bony abnormality is noted. IMPRESSION: No active disease. Electronically Signed   By: Inez Catalina M.D.   On: 04/01/2021 19:13   EEG adult  Result Date: 04/02/2021 Greta Doom, MD     04/02/2021  2:37 PM History: 70 year old female being evaluated for possible seizure after an episode of aphasia Sedation: None Technique: This EEG was acquired with electrodes placed according to the International 10-20 electrode system (including Fp1, Fp2, F3, F4, C3, C4, P3, P4, O1, O2, T3, T4, T5, T6, A1, A2, Fz, Cz, Pz). The following electrodes were missing or displaced: none. Background: The background consists of intermixed alpha and beta activities. There is a well defined posterior dominant rhythm of 9-10 Hz that attenuates with eye opening. Sleep is recorded with normal appearing structures.  Photic stimulation: Physiologic driving is not performed  EEG Abnormalities: None Clinical Interpretation: This normal EEG is recorded in the waking and sleep state. There was no seizure or seizure predisposition recorded on this study. Please note that lack of epileptiform activity on EEG does not preclude the possibility of epilepsy. Roland Rack, MD [facility] [phone] If 7pm- 7am, please page neurology on call as listed in [place].     Discharge Exam: Vitals:   04/04/21 0740 04/04/21 1008  BP: (!) 152/66 (!) 145/64  Pulse:    Resp: 14   Temp: 98 F (36.7 C)   SpO2: 97%    Vitals:   04/03/21 2331 04/04/21 0358 04/04/21 0740 04/04/21 1008  BP: 140/62 134/65 (!) 152/66 (!) 145/64  Pulse: 72 68  Resp: 20 20 14    Temp: 98.7 F (37.1 C) 98 F (36.7 C) 98 F (36.7 C)   TempSrc: Oral Oral Oral   SpO2: 99% 98% 97%   Weight:  96.8 kg    Height:        General: Pt is alert, awake, not in acute distress Cardiovascular: RRR, S1/S2 +, no rubs, no gallops Respiratory: CTA bilaterally, no wheezing, no rhonchi Abdominal: Soft, NT, ND, bowel sounds + Extremities: no edema, no cyanosis    The results of significant diagnostics from this hospitalization (including imaging, microbiology, ancillary and laboratory) are listed below for reference.     Microbiology: Recent Results (from the past 240 hour(s))  Resp Panel by RT-PCR (Flu A&B, Covid) Nasopharyngeal Swab     Status: None   Collection Time: 04/01/21  8:53 PM   Specimen: Nasopharyngeal Swab; Nasopharyngeal(NP) swabs in vial transport medium  Result Value Ref Range Status   SARS Coronavirus 2 by RT PCR NEGATIVE NEGATIVE Final    Comment: (NOTE) SARS-CoV-2 target nucleic acids are NOT DETECTED.  The SARS-CoV-2 RNA is generally detectable in upper respiratory specimens during the acute phase of infection. The lowest concentration of SARS-CoV-2 viral copies this assay can detect is 138 copies/mL. A negative result does not preclude SARS-Cov-2 infection and should not be used as the sole basis for treatment or other patient management decisions. A negative result may occur with  improper specimen collection/handling, submission of specimen other than nasopharyngeal swab, presence of viral mutation(s) within the areas targeted by this assay, and inadequate number  of viral copies(<138 copies/mL). A negative result must be combined with clinical observations, patient history, and epidemiological information. The expected result is Negative.  Fact Sheet for Patients:  EntrepreneurPulse.com.au  Fact Sheet for Healthcare Providers:  IncredibleEmployment.be  This test is no t yet approved or cleared by the [place] FDA and  has been authorized for detection and/or diagnosis of SARS-CoV-2 by FDA under an Emergency Use Authorization (EUA). This EUA will remain  in effect (meaning this test can be used) for the duration of the COVID-19 declaration under Section 564(b)(1) of the Act, 21 U.S.C.section 360bbb-3(b)(1), unless the authorization is terminated  or revoked sooner.       Influenza A by PCR NEGATIVE NEGATIVE Final   Influenza B by PCR NEGATIVE NEGATIVE Final    Comment: (NOTE) The Xpert Xpress SARS-CoV-2/FLU/RSV plus assay is intended as an aid in the diagnosis of influenza from Nasopharyngeal swab specimens and should not be used as a sole basis for treatment. Nasal washings and aspirates are unacceptable for Xpert Xpress SARS-CoV-2/FLU/RSV testing.  Fact Sheet for Patients: EntrepreneurPulse.com.au  Fact Sheet for Healthcare Providers: IncredibleEmployment.be  This test is not yet approved or cleared by the [place] FDA and has been authorized for detection and/or diagnosis of SARS-CoV-2 by FDA under an Emergency Use Authorization (EUA). This EUA will remain in effect (meaning this test can be used) for the duration of the COVID-19 declaration under Section 564(b)(1) of the Act, 21 U.S.C. section 360bbb-3(b)(1), unless the authorization is terminated or revoked.  Performed at [facility], [address], [place], [place] [address]   MRSA Next Gen by PCR, Nasal     Status: None   Collection Time: 04/02/21 12:25 AM   Specimen:  Nasal Mucosa; Nasal Swab  Result Value Ref Range Status   MRSA by PCR Next Gen NOT DETECTED NOT DETECTED Final    Comment: (NOTE) The GeneXpert MRSA Assay (FDA approved  for NASAL specimens only), is one component of a comprehensive MRSA colonization surveillance program. It is not intended to diagnose MRSA infection nor to guide or monitor treatment for MRSA infections. Test performance is not FDA approved in patients less than 73 years old. Performed at [facility], [place] [address], [place], [place] [address]      Labs: BNP (last 3 results) No results for input(s): BNP in the last 8760 hours. Basic Metabolic Panel: Recent Labs  Lab 03/31/21 1542 04/01/21 1807 04/01/21 1825 04/01/21 2038 04/02/21 0507 04/02/21 1322 04/03/21 0111 04/04/21 0017  NA 138 138 136  --  137  --  138 137  K 3.4* 2.8* 2.6*  --  2.6* 3.8 4.2 4.3  CL 97* 95*  --   --  100  --  106 106  CO2 25 26  --   --  25  --  23 23  GLUCOSE 180* 151*  --   --  132*  --  120* 130*  BUN 6* 5*  --   --  <5*  --  <5* 5*  CREATININE 0.83 0.68  --   --  0.74  --  0.69 0.72  CALCIUM 7.2* 6.9*  --   --  7.0*  --  7.0* 7.7*  MG  --   --   --  <0.5* 2.3  --  1.6* 1.9   Liver Function Tests: Recent Labs  Lab 03/31/21 1542 04/01/21 1807  AST 18 19  ALT 13 13  ALKPHOS 82 73  BILITOT 0.5 0.5  PROT 7.2 6.9  ALBUMIN 4.3 4.0   Recent Labs  Lab 03/31/21 1542  LIPASE 17   No results for input(s): AMMONIA in the last 168 hours. CBC: Recent Labs  Lab 03/31/21 1542 04/01/21 1807 04/01/21 1825 04/03/21 0111 04/04/21 0017  WBC 14.4* 11.3*  --  9.8 10.8*  NEUTROABS  --  8.3*  --  5.8 6.2  HGB 13.2 12.2 12.2 11.1* 11.7*  HCT 39.2 36.1 36.0 34.1* 35.9*  MCV 81.8 80.9  --  84.0 84.7  PLT 419* 350  --  307 325   Cardiac Enzymes: No results for input(s): CKTOTAL, CKMB, CKMBINDEX, TROPONINI in the last 168 hours. BNP: Invalid input(s): POCBNP CBG: Recent Labs  Lab 04/03/21 0645 04/03/21 1127  04/03/21 1558 04/03/21 2129 04/04/21 0615  GLUCAP 119* 123* 129* 105* 131*   D-Dimer No results for input(s): DDIMER in the last 72 hours. Hgb A1c Recent Labs    04/02/21 0507  HGBA1C 7.2*   Lipid Profile No results for input(s): CHOL, HDL, LDLCALC, TRIG, CHOLHDL, LDLDIRECT in the last 72 hours. Thyroid function studies No results for input(s): TSH, T4TOTAL, T3FREE, THYROIDAB in the last 72 hours.  Invalid input(s): FREET3 Anemia work up No results for input(s): VITAMINB12, FOLATE, FERRITIN, TIBC, IRON, RETICCTPCT in the last 72 hours. Urinalysis    Component Value Date/Time   COLORURINE COLORLESS (A) 03/31/2021 2040   APPEARANCEUR CLEAR 03/31/2021 2040   LABSPEC 1.018 03/31/2021 2040   PHURINE 6.0 03/31/2021 2040   GLUCOSEU NEGATIVE 03/31/2021 2040   HGBUR TRACE (A) 03/31/2021 2040   BILIRUBINUR NEGATIVE 03/31/2021 2040   KETONESUR NEGATIVE 03/31/2021 2040   PROTEINUR NEGATIVE 03/31/2021 2040   NITRITE NEGATIVE 03/31/2021 2040   LEUKOCYTESUR TRACE (A) 03/31/2021 2040   Sepsis Labs Invalid input(s): PROCALCITONIN,  WBC,  LACTICIDVEN Microbiology Recent Results (from the past 240 hour(s))  Resp Panel by RT-PCR (Flu A&B, Covid) Nasopharyngeal Swab  Status: None   Collection Time: 04/01/21  8:53 PM   Specimen: Nasopharyngeal Swab; Nasopharyngeal(NP) swabs in vial transport medium  Result Value Ref Range Status   SARS Coronavirus 2 by RT PCR NEGATIVE NEGATIVE Final    Comment: (NOTE) SARS-CoV-2 target nucleic acids are NOT DETECTED.  The SARS-CoV-2 RNA is generally detectable in upper respiratory specimens during the acute phase of infection. The lowest concentration of SARS-CoV-2 viral copies this assay can detect is 138 copies/mL. A negative result does not preclude SARS-Cov-2 infection and should not be used as the sole basis for treatment or other patient management decisions. A negative result may occur with  improper specimen collection/handling, submission  of specimen other than nasopharyngeal swab, presence of viral mutation(s) within the areas targeted by this assay, and inadequate number of viral copies(<138 copies/mL). A negative result must be combined with clinical observations, patient history, and epidemiological information. The expected result is Negative.  Fact Sheet for Patients:  EntrepreneurPulse.com.au  Fact Sheet for Healthcare Providers:  IncredibleEmployment.be  This test is no t yet approved or cleared by the [place] FDA and  has been authorized for detection and/or diagnosis of SARS-CoV-2 by FDA under an Emergency Use Authorization (EUA). This EUA will remain  in effect (meaning this test can be used) for the duration of the COVID-19 declaration under Section 564(b)(1) of the Act, 21 U.S.C.section 360bbb-3(b)(1), unless the authorization is terminated  or revoked sooner.       Influenza A by PCR NEGATIVE NEGATIVE Final   Influenza B by PCR NEGATIVE NEGATIVE Final    Comment: (NOTE) The Xpert Xpress SARS-CoV-2/FLU/RSV plus assay is intended as an aid in the diagnosis of influenza from Nasopharyngeal swab specimens and should not be used as a sole basis for treatment. Nasal washings and aspirates are unacceptable for Xpert Xpress SARS-CoV-2/FLU/RSV testing.  Fact Sheet for Patients: EntrepreneurPulse.com.au  Fact Sheet for Healthcare Providers: IncredibleEmployment.be  This test is not yet approved or cleared by the [place] FDA and has been authorized for detection and/or diagnosis of SARS-CoV-2 by FDA under an Emergency Use Authorization (EUA). This EUA will remain in effect (meaning this test can be used) for the duration of the COVID-19 declaration under Section 564(b)(1) of the Act, 21 U.S.C. section 360bbb-3(b)(1), unless the authorization is terminated or revoked.  Performed at [facility], [address] [place], [place] [address]   MRSA Next Gen by PCR, Nasal     Status: None   Collection Time: 04/02/21 12:25 AM   Specimen: Nasal Mucosa; Nasal Swab  Result Value Ref Range Status   MRSA by PCR Next Gen NOT DETECTED NOT DETECTED Final    Comment: (NOTE) The GeneXpert MRSA Assay (FDA approved for NASAL specimens only), is one component of a comprehensive MRSA colonization surveillance program. It is not intended to diagnose MRSA infection nor to guide or monitor treatment for MRSA infections. Test performance is not FDA approved in patients less than 32 years old. Performed at [facility], [place] [address], [place], [place] [address]      Time coordinating discharge: Over 30 minutes  SIGNED:   Darliss Cheney, MD  [facility] 04/04/2021, 10:21 AM  If 7PM-7AM, please contact night-coverage [email or web address]

## 2021-04-04 NOTE — Evaluation (Signed)
Speech Language Pathology Evaluation Patient Details Name: Suzanne Greer MRN: [id number] DOB: Jan 21, 1951 Today's Date: 04/04/2021 Time: 5366-4403 SLP Time Calculation (min) (ACUTE ONLY): 23 min  Problem List:  Patient Active Problem List   Diagnosis Date Noted   Seizure ([place]) 04/02/2021   Hypomagnesemia 04/02/2021   Hypokalemia 04/02/2021   Intractable vomiting with nausea 04/02/2021   Intractable nausea and vomiting 04/01/2021   OSA (obstructive sleep apnea) 06/14/2018   Atrial fibrillation ([place])    Obesity    Type 2 diabetes mellitus with complication (HCC)    Fibromyalgia    Hypertension    GERD (gastroesophageal reflux disease)    Insomnia    Meniere disease    Vertigo    Past Medical History:  Past Medical History:  Diagnosis Date   Atrial fibrillation ([place])    Back pain    Diabetes mellitus    Dysrhythmia    history Atrial Flutter. 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   Obesity    Restless legs    Sleep apnea    no cpap use now-unable to tolerate mask, surgery a yr ago   Vertigo    Past Surgical History:  Past Surgical History:  Procedure Laterality Date   APPENDECTOMY     BUNIONECTOMY     ESOPHAGOGASTRODUODENOSCOPY (EGD) WITH PROPOFOL N/A 08/16/2015   Procedure: ESOPHAGOGASTRODUODENOSCOPY (EGD) WITH PROPOFOL;  Surgeon: Garlan Fair, MD;  Location: [facility];  Service: Endoscopy;  Laterality: N/A;   GANGLION CYST   1980   REMOVED    HEMORROIDECTOMY     THYROIDECTOMY, PARTIAL  2005   "partial for growth-benign"   UPPER ENDOSCOPIC ULTRASOUND W/ FNA     UVULECTOMY     excised about a yr ago [facility]   HPI:  This is a 70 year old lady with a history of atrial fibrillation on Eliquis, hypertension  and other comorbidities who was admitted to hospital service secondary to nausea vomiting and actually had 1 episode of witnessed seizure in the ED as well.  She was found to have significantly low magnesium.  Seizure was thought to be secondary to severe hypomagnesemia.  She was admitted under hospitalist service.  Code stroke called and cancelled 10/16 AM.  MRI negative 10/16.   Assessment / Plan / Recommendation Clinical Impression  Pt presents with functional cognitive linguistic ability and seems to have had an improvement in status since yesterday.  Pt was assessed using the COGNISTAT (see below for additional information). Pt performed within the average range on all subtests, but required 5 trials for registration of word retrieval task.  Pt recalled all target words after delay and required only one category cue.  This does not seem to represent a true working memory or registration deficit. Pt may have had difficulty hearing targets initially.  SLP was wearing a mask and pt has new hearing aids which were in place and working.  Pt states that she feels much more like herself today and family endorses improvement as well. Pt did not exhibit any word finding deficits today as observed on 10/16. Family shared concern that she may struggle to be independent in home.  Consider a home health speech assessment to evaluate cognitive linguistic ability  in functional home context with challenges to higher level cognitive function to determine if ongoing ST is indicated at next level of care.  Pt may benefit from neuropsychology evaluation if new higher level deficits are noted as well or symptoms persist.  Pt is planned for discharged today with home PT/ST/OT.  COGNISTAT: All subtests are within the average range, except where otherwise specified.  Orientation:  12/12 Attention: 6/8 Comprehension: 6/6 Repetition: 11/12 Naming: 8/8 Construction: not assessed Memory: 11/12 Calculations:  3/4 Similarities: 8/8 Judgment: 6/6     SLP Assessment  SLP Recommendation/Assessment: All further Speech Lanaguage Pathology  needs can be addressed in the next venue of care SLP Visit Diagnosis: Cognitive communication deficit (R41.841)    Recommendations for follow up therapy are one component of a multi-disciplinary discharge planning process, led by the attending physician.  Recommendations may be updated based on patient status, additional functional criteria and insurance authorization.    Follow Up Recommendations  Home health SLP    Frequency and Duration  (N/A)         SLP Evaluation Cognition  Overall Cognitive Status: Within Functional Limits for tasks assessed Orientation Level: Oriented to person;Oriented to place;Oriented to time Year: 2022 Month: October Day of Week: Correct Attention: Focused;Sustained Focused Attention: Appears intact Sustained Attention: Appears intact Memory: Impaired Memory Impairment: Storage deficit Problem Solving: Appears intact Executive Function: Reasoning Reasoning: Appears intact Safety/Judgment: Appears intact       Comprehension  Auditory Comprehension Overall Auditory Comprehension: Appears within functional limits for tasks assessed Commands: Within Functional Limits Conversation: Simple Visual Recognition/Discrimination Discrimination: Not tested Reading Comprehension Reading Status: Not tested    Expression Expression Primary Mode of Expression: Verbal Verbal Expression Overall Verbal Expression: Appears within functional limits for tasks assessed Initiation: No impairment Repetition: No impairment Naming: No impairment Pragmatics: No impairment   Oral / Motor  Oral Motor/Sensory Function Overall Oral Motor/Sensory Function: Within functional limits Motor Speech Overall Motor Speech: Appears within functional limits for tasks assessed Phonation: Normal Resonance: Within functional limits Articulation:  Within functional limitis Intelligibility: Intelligible Motor Planning: Witnin functional limits Motor Speech Errors: Not applicable   [place], [place], [place] Office: [phone] 04/04/2021, 12:11 PM

## 2021-04-05 ENCOUNTER — Other Ambulatory Visit: Payer: Self-pay | Admitting: Neurology

## 2021-04-05 DIAGNOSIS — Z9181 History of falling: Secondary | ICD-10-CM | POA: Diagnosis not present

## 2021-04-05 DIAGNOSIS — E876 Hypokalemia: Secondary | ICD-10-CM | POA: Diagnosis not present

## 2021-04-05 DIAGNOSIS — T50905A Adverse effect of unspecified drugs, medicaments and biological substances, initial encounter: Secondary | ICD-10-CM | POA: Diagnosis not present

## 2021-04-05 DIAGNOSIS — I4891 Unspecified atrial fibrillation: Secondary | ICD-10-CM | POA: Diagnosis not present

## 2021-04-05 DIAGNOSIS — K219 Gastro-esophageal reflux disease without esophagitis: Secondary | ICD-10-CM | POA: Diagnosis not present

## 2021-04-05 DIAGNOSIS — G4733 Obstructive sleep apnea (adult) (pediatric): Secondary | ICD-10-CM | POA: Diagnosis not present

## 2021-04-05 DIAGNOSIS — R569 Unspecified convulsions: Secondary | ICD-10-CM | POA: Diagnosis not present

## 2021-04-05 DIAGNOSIS — Z9989 Dependence on other enabling machines and devices: Secondary | ICD-10-CM | POA: Diagnosis not present

## 2021-04-05 DIAGNOSIS — Z7984 Long term (current) use of oral hypoglycemic drugs: Secondary | ICD-10-CM | POA: Diagnosis not present

## 2021-04-05 DIAGNOSIS — E669 Obesity, unspecified: Secondary | ICD-10-CM | POA: Diagnosis not present

## 2021-04-05 DIAGNOSIS — E785 Hyperlipidemia, unspecified: Secondary | ICD-10-CM | POA: Diagnosis not present

## 2021-04-05 DIAGNOSIS — I10 Essential (primary) hypertension: Secondary | ICD-10-CM | POA: Diagnosis not present

## 2021-04-05 DIAGNOSIS — E118 Type 2 diabetes mellitus with unspecified complications: Secondary | ICD-10-CM | POA: Diagnosis not present

## 2021-04-05 DIAGNOSIS — Z7901 Long term (current) use of anticoagulants: Secondary | ICD-10-CM | POA: Diagnosis not present

## 2021-04-06 DIAGNOSIS — T887XXA Unspecified adverse effect of drug or medicament, initial encounter: Secondary | ICD-10-CM | POA: Diagnosis not present

## 2021-04-06 DIAGNOSIS — Z09 Encounter for follow-up examination after completed treatment for conditions other than malignant neoplasm: Secondary | ICD-10-CM | POA: Diagnosis not present

## 2021-04-06 DIAGNOSIS — E876 Hypokalemia: Secondary | ICD-10-CM | POA: Diagnosis not present

## 2021-04-06 DIAGNOSIS — I4891 Unspecified atrial fibrillation: Secondary | ICD-10-CM | POA: Diagnosis not present

## 2021-04-06 DIAGNOSIS — R569 Unspecified convulsions: Secondary | ICD-10-CM | POA: Diagnosis not present

## 2021-04-06 DIAGNOSIS — E1140 Type 2 diabetes mellitus with diabetic neuropathy, unspecified: Secondary | ICD-10-CM | POA: Diagnosis not present

## 2021-04-06 DIAGNOSIS — R112 Nausea with vomiting, unspecified: Secondary | ICD-10-CM | POA: Diagnosis not present

## 2021-04-06 DIAGNOSIS — Z87898 Personal history of other specified conditions: Secondary | ICD-10-CM | POA: Diagnosis not present

## 2021-04-06 DIAGNOSIS — R2681 Unsteadiness on feet: Secondary | ICD-10-CM | POA: Diagnosis not present

## 2021-04-06 DIAGNOSIS — E118 Type 2 diabetes mellitus with unspecified complications: Secondary | ICD-10-CM | POA: Diagnosis not present

## 2021-04-06 DIAGNOSIS — R899 Unspecified abnormal finding in specimens from other organs, systems and tissues: Secondary | ICD-10-CM | POA: Diagnosis not present

## 2021-04-06 DIAGNOSIS — T50905A Adverse effect of unspecified drugs, medicaments and biological substances, initial encounter: Secondary | ICD-10-CM | POA: Diagnosis not present

## 2021-04-06 DIAGNOSIS — Z1211 Encounter for screening for malignant neoplasm of colon: Secondary | ICD-10-CM | POA: Diagnosis not present

## 2021-04-07 DIAGNOSIS — I4891 Unspecified atrial fibrillation: Secondary | ICD-10-CM | POA: Diagnosis not present

## 2021-04-07 DIAGNOSIS — E876 Hypokalemia: Secondary | ICD-10-CM | POA: Diagnosis not present

## 2021-04-07 DIAGNOSIS — E118 Type 2 diabetes mellitus with unspecified complications: Secondary | ICD-10-CM | POA: Diagnosis not present

## 2021-04-07 DIAGNOSIS — R569 Unspecified convulsions: Secondary | ICD-10-CM | POA: Diagnosis not present

## 2021-04-07 DIAGNOSIS — T50905A Adverse effect of unspecified drugs, medicaments and biological substances, initial encounter: Secondary | ICD-10-CM | POA: Diagnosis not present

## 2021-04-08 DIAGNOSIS — T50905A Adverse effect of unspecified drugs, medicaments and biological substances, initial encounter: Secondary | ICD-10-CM | POA: Diagnosis not present

## 2021-04-08 DIAGNOSIS — I4891 Unspecified atrial fibrillation: Secondary | ICD-10-CM | POA: Diagnosis not present

## 2021-04-08 DIAGNOSIS — E876 Hypokalemia: Secondary | ICD-10-CM | POA: Diagnosis not present

## 2021-04-08 DIAGNOSIS — R569 Unspecified convulsions: Secondary | ICD-10-CM | POA: Diagnosis not present

## 2021-04-08 DIAGNOSIS — E118 Type 2 diabetes mellitus with unspecified complications: Secondary | ICD-10-CM | POA: Diagnosis not present

## 2021-04-08 NOTE — Progress Notes (Signed)
Cardiology Office Note   Date:  04/11/2021   ID:  Suzanne, Greer [birth year], MRN [id number]  PCP:  Suzanne Shanks, MD  Cardiologist:   Suzanne Dolloff [place], MD   Chief Complaint  Patient presents with   Atrial Fibrillation       History of Present Illness: Suzanne Greer is a 70 y.o. female who is seen for follow up Atrial fibrillation/flutter. She has a history of HTN, OSA, and DM.  Has been in Rhythmol and Eliquis for years. She had a remote Echo in 2011 that showed Mild LVH otherwise normal. She reports a Myoview study years ago at [facility] that apparently was normal. She is on CPAP therapy. She reports that in December she was having increased symptoms with Afib and was placed on diltiazem in addition to propafenone and metoprolol. She states she was having episodes at least 2 days a week. When her HR is over 100 she feels palpitations. When HR is up to 150 she feels extremely tired and has chest tightness and pressure like something sitting on her chest. She does have chronic dizziness but attributes this to Meniere's disease. She does not drink caffeine.  Echo in February 2021 was normal.  She was recently admitted to hospital service secondary to nausea vomiting and actually had 1 episode of witnessed seizure in the ED as well. Initially at [place] she had some Afib with RVR.  She was found to have significantly low magnesium.  Seizure was thought to be secondary to severe hypomagnesemia. N/V felt to be related to Ozempic.   On follow up today she reports she is better. Has had a couple of episodes of Afib. Is taking propafenone twice a day now. Labs followed by PCP.    Past Medical History:  Diagnosis Date   Atrial fibrillation ([place])    Back pain    Diabetes mellitus    Dysrhythmia    history Atrial Flutter. 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   Obesity    Restless legs    Sleep apnea    no cpap use now-unable to tolerate mask, surgery a yr ago   Vertigo     Past Surgical History:  Procedure Laterality Date   APPENDECTOMY     BUNIONECTOMY     ESOPHAGOGASTRODUODENOSCOPY (EGD) WITH PROPOFOL N/A 08/16/2015   Procedure: ESOPHAGOGASTRODUODENOSCOPY (EGD) WITH PROPOFOL;  Surgeon: Garlan Fair, MD;  Location: [facility];  Service: Endoscopy;  Laterality: N/A;   GANGLION CYST   1980   REMOVED    HEMORROIDECTOMY     THYROIDECTOMY, PARTIAL  2005   "partial for growth-benign"   UPPER ENDOSCOPIC ULTRASOUND W/ FNA     UVULECTOMY     excised about a yr ago [facility]     Current Outpatient Medications  Medication Sig Dispense Refill   albuterol (VENTOLIN HFA) 108 (90 Base) MCG/ACT inhaler Inhale 2 puffs into the lungs every 6 (six) hours as needed for wheezing or shortness of breath.     apixaban (ELIQUIS) 5 MG TABS tablet Take 1 tablet (5 mg total) by mouth 2 (two) times daily. 180 tablet 3   atorvastatin (LIPITOR) 20 MG tablet Take 20  mg by mouth daily.     cyclobenzaprine (FLEXERIL) 10 MG tablet TAKE 1 TABLET BY MOUTH EVERYDAY AT BEDTIME. Call [phone] to schedule yearly follow up for ongoing refills (Patient taking differently: Take 10 mg by mouth at bedtime as needed for muscle spasms. Call [phone] to schedule yearly follow up for ongoing refills) 15 tablet 0   diltiazem (CARDIZEM CD) 120 MG 24 hr capsule Take 1 capsule (120 mg total) by mouth daily. 90 capsule 3   fluticasone (FLONASE) 50 MCG/ACT nasal spray Place 2 sprays into both nostrils daily.     Magnesium 400 MG TABS Take 400 mg by mouth daily.      Melatonin 10 MG TABS Take 10 mg by mouth at bedtime.      metFORMIN (GLUCOPHAGE-XR) 500 MG 24 hr tablet Take 1,000 mg by mouth 2 (two) times daily.      metoprolol  succinate (TOPROL-XL) 50 MG 24 hr tablet Take 1 tablet (50 mg total) by mouth daily. 90 tablet 3   montelukast (SINGULAIR) 10 MG tablet Take 10 mg by mouth at bedtime.     omeprazole (PRILOSEC) 40 MG capsule Take 40 mg by mouth daily.     propafenone (RYTHMOL) 300 MG tablet Take 1 tablet (300 mg total) by mouth every 8 (eight) hours. 270 tablet 3   traMADol (ULTRAM) 50 MG tablet Take 50 mg by mouth 3 (three) times daily as needed (Pain).      VITAMIN D PO Take 1 tablet by mouth daily.     No current facility-administered medications for this visit.    Allergies:   Doxycycline, Ozempic (0.25 or 0.5 mg-dose) [semaglutide(0.25 or 0.5mg -dos)], Yeast-related products, Molds & smuts, Penicillins, and Sulfa drugs cross reactors    Social History:  The patient  reports that she has never smoked. She has never used smokeless tobacco. She reports that she does not drink alcohol and does not use drugs.   Family History:  The patient's family history includes Cancer (age of onset: 67) in her mother; Diabetes in her father; Drug abuse in her brother; Heart disease in her mother; Hepatitis C in her brother.    ROS:  Please see the history of present illness.   Otherwise, review of systems are positive for none.   All other systems are reviewed and negative.    PHYSICAL EXAM: VS:  BP 138/62   Pulse 80   Ht 5\' 5"  (1.651 m)   Wt 213 lb (96.6 kg)   SpO2 96%   BMI 35.45 kg/m  , BMI Body mass index is 35.45 kg/m. GEN: Well nourished, overweight, in no acute distress  HEENT: normal  Neck: no JVD, carotid bruits, or masses Cardiac: IRRR; no murmurs, rubs, or gallops,no edema  Respiratory:  clear to auscultation bilaterally, normal work of breathing GI: soft, nontender, nondistended, + BS MS: no deformity or atrophy  Skin: warm and dry, no rash Neuro:  Strength and sensation are intact Psych: euthymic mood, full affect   EKG:  EKG is not ordered today.    Recent Labs: 04/01/2021: ALT  13 04/04/2021: BUN 5; Creatinine, Ser 0.72; Hemoglobin 11.7; Magnesium 1.9; Platelets 325; Potassium 4.3; Sodium 137    Lipid Panel No results found for: CHOL, TRIG, HDL, CHOLHDL, VLDL, LDLCALC, LDLDIRECT   Dated 09/25/18: A1c 7.6%. cholesterol 216, triglycerides 134, HDL 56, LDL 133. CMET and TSH normal. Dated 10/21/19: A1c 7.2%. cholesterol 157, triglycerides 185, HDL 54, LDL 72. Glucose 160 otherwise CMET normal.  Dated 04/23/20: A1c  7%. Cholesterol 200, triglycerides 161, HDL 55, LDL 117. Glucose 154 otherwise CMET and TSH normal.  Dated 03/23/21: cholesterol 150, triglycerides 157, HDL 48, LDL 84. A1c 7.2%.   Wt Readings from Last 3 Encounters:  04/11/21 213 lb (96.6 kg)  04/04/21 213 lb 6.5 oz (96.8 kg)  03/31/21 215 lb (97.5 kg)      Other studies Reviewed: Additional studies/ records that were reviewed today include:   Echo 07/30/19: IMPRESSIONS     1. Left ventricular ejection fraction by 3D volume is 57 %. The left  ventricle has normal function. The left ventrical has no regional wall  motion abnormalities. There is mildly increased concentric left  ventricular hypertrophy. Left ventricular  diastolic parameters are consistent with Grade I diastolic dysfunction  (impaired relaxation). The average left ventricular global longitudinal  strain is -18.5 %.   2. Right ventricular systolic function is mildly reduced. The right  ventricular size is normal. Tricuspid regurgitation signal is inadequate  for assessing PA pressure.   3. The mitral valve is normal in structure and function. trivial mitral  valve regurgitation. No evidence of mitral stenosis.   4. The aortic valve is normal in structure and function. Aortic valve  regurgitation is not visualized. No aortic stenosis is present.   ASSESSMENT AND PLAN:  1.  Paroxysmal Atrial fibrillation. Patient on long term AAD therapy with propafenone. On Diltiazem and Toprol for rate control.   She has generally been doing OK on  twice a day dosing. Recent episodes probably related to severe electrolyte imbalance. If need be we can increase propafenone to tid. She has a [place] Vasc score of  3-4 so is on Eliquis. If she does have more AFib uncontrolled with current therapy then I would recommend EP evaluation for possible ablation. 2. DM type 2. Per PCP 3. HLD. On lipitor 4. OSA on CPAP. Stressed importance of keeping weight under control.   Current medicines are reviewed at length with the patient today.  The patient does not have concerns regarding medicines.  The following changes have been made:  no change  Labs/ tests ordered today include:   No orders of the defined types were placed in this encounter.    Disposition:   FU with me in 6 months.   Signed, Verdun Rackley [place], MD  04/11/2021 4:36 PM    [facility] [address], [place], [place], [address] Phone [phone], Fax [phone]

## 2021-04-11 ENCOUNTER — Ambulatory Visit (INDEPENDENT_AMBULATORY_CARE_PROVIDER_SITE_OTHER): Payer: Medicare Other | Admitting: Cardiology

## 2021-04-11 ENCOUNTER — Encounter: Payer: Self-pay | Admitting: Cardiology

## 2021-04-11 ENCOUNTER — Other Ambulatory Visit: Payer: Self-pay

## 2021-04-11 VITALS — BP 138/62 | HR 80 | Ht 65.0 in | Wt 213.0 lb

## 2021-04-11 DIAGNOSIS — I480 Paroxysmal atrial fibrillation: Secondary | ICD-10-CM | POA: Diagnosis not present

## 2021-04-11 DIAGNOSIS — E876 Hypokalemia: Secondary | ICD-10-CM | POA: Diagnosis not present

## 2021-04-11 DIAGNOSIS — I4891 Unspecified atrial fibrillation: Secondary | ICD-10-CM | POA: Diagnosis not present

## 2021-04-11 DIAGNOSIS — E7800 Pure hypercholesterolemia, unspecified: Secondary | ICD-10-CM | POA: Diagnosis not present

## 2021-04-11 DIAGNOSIS — G4733 Obstructive sleep apnea (adult) (pediatric): Secondary | ICD-10-CM | POA: Diagnosis not present

## 2021-04-11 DIAGNOSIS — E118 Type 2 diabetes mellitus with unspecified complications: Secondary | ICD-10-CM | POA: Diagnosis not present

## 2021-04-11 DIAGNOSIS — R569 Unspecified convulsions: Secondary | ICD-10-CM | POA: Diagnosis not present

## 2021-04-11 DIAGNOSIS — T50905A Adverse effect of unspecified drugs, medicaments and biological substances, initial encounter: Secondary | ICD-10-CM | POA: Diagnosis not present

## 2021-04-12 DIAGNOSIS — E876 Hypokalemia: Secondary | ICD-10-CM | POA: Diagnosis not present

## 2021-04-12 DIAGNOSIS — T50905A Adverse effect of unspecified drugs, medicaments and biological substances, initial encounter: Secondary | ICD-10-CM | POA: Diagnosis not present

## 2021-04-12 DIAGNOSIS — E118 Type 2 diabetes mellitus with unspecified complications: Secondary | ICD-10-CM | POA: Diagnosis not present

## 2021-04-12 DIAGNOSIS — I4891 Unspecified atrial fibrillation: Secondary | ICD-10-CM | POA: Diagnosis not present

## 2021-04-12 DIAGNOSIS — R569 Unspecified convulsions: Secondary | ICD-10-CM | POA: Diagnosis not present

## 2021-04-13 DIAGNOSIS — R569 Unspecified convulsions: Secondary | ICD-10-CM | POA: Diagnosis not present

## 2021-04-13 DIAGNOSIS — E876 Hypokalemia: Secondary | ICD-10-CM | POA: Diagnosis not present

## 2021-04-13 DIAGNOSIS — E118 Type 2 diabetes mellitus with unspecified complications: Secondary | ICD-10-CM | POA: Diagnosis not present

## 2021-04-13 DIAGNOSIS — T50905A Adverse effect of unspecified drugs, medicaments and biological substances, initial encounter: Secondary | ICD-10-CM | POA: Diagnosis not present

## 2021-04-13 DIAGNOSIS — I4891 Unspecified atrial fibrillation: Secondary | ICD-10-CM | POA: Diagnosis not present

## 2021-04-14 DIAGNOSIS — R899 Unspecified abnormal finding in specimens from other organs, systems and tissues: Secondary | ICD-10-CM | POA: Diagnosis not present

## 2021-04-14 DIAGNOSIS — E876 Hypokalemia: Secondary | ICD-10-CM | POA: Diagnosis not present

## 2021-04-14 DIAGNOSIS — E1140 Type 2 diabetes mellitus with diabetic neuropathy, unspecified: Secondary | ICD-10-CM | POA: Diagnosis not present

## 2021-04-14 DIAGNOSIS — R4182 Altered mental status, unspecified: Secondary | ICD-10-CM | POA: Diagnosis not present

## 2021-04-14 DIAGNOSIS — R569 Unspecified convulsions: Secondary | ICD-10-CM | POA: Diagnosis not present

## 2021-04-14 DIAGNOSIS — T50905A Adverse effect of unspecified drugs, medicaments and biological substances, initial encounter: Secondary | ICD-10-CM | POA: Diagnosis not present

## 2021-04-14 DIAGNOSIS — E118 Type 2 diabetes mellitus with unspecified complications: Secondary | ICD-10-CM | POA: Diagnosis not present

## 2021-04-14 DIAGNOSIS — I4891 Unspecified atrial fibrillation: Secondary | ICD-10-CM | POA: Diagnosis not present

## 2021-04-15 DIAGNOSIS — T50905A Adverse effect of unspecified drugs, medicaments and biological substances, initial encounter: Secondary | ICD-10-CM | POA: Diagnosis not present

## 2021-04-15 DIAGNOSIS — R569 Unspecified convulsions: Secondary | ICD-10-CM | POA: Diagnosis not present

## 2021-04-15 DIAGNOSIS — E118 Type 2 diabetes mellitus with unspecified complications: Secondary | ICD-10-CM | POA: Diagnosis not present

## 2021-04-15 DIAGNOSIS — I4891 Unspecified atrial fibrillation: Secondary | ICD-10-CM | POA: Diagnosis not present

## 2021-04-15 DIAGNOSIS — E876 Hypokalemia: Secondary | ICD-10-CM | POA: Diagnosis not present

## 2021-04-18 DIAGNOSIS — T50905A Adverse effect of unspecified drugs, medicaments and biological substances, initial encounter: Secondary | ICD-10-CM | POA: Diagnosis not present

## 2021-04-18 DIAGNOSIS — R569 Unspecified convulsions: Secondary | ICD-10-CM | POA: Diagnosis not present

## 2021-04-18 DIAGNOSIS — E876 Hypokalemia: Secondary | ICD-10-CM | POA: Diagnosis not present

## 2021-04-18 DIAGNOSIS — E118 Type 2 diabetes mellitus with unspecified complications: Secondary | ICD-10-CM | POA: Diagnosis not present

## 2021-04-18 DIAGNOSIS — I4891 Unspecified atrial fibrillation: Secondary | ICD-10-CM | POA: Diagnosis not present

## 2021-04-19 DIAGNOSIS — I4891 Unspecified atrial fibrillation: Secondary | ICD-10-CM | POA: Diagnosis not present

## 2021-04-19 DIAGNOSIS — E876 Hypokalemia: Secondary | ICD-10-CM | POA: Diagnosis not present

## 2021-04-19 DIAGNOSIS — T50905A Adverse effect of unspecified drugs, medicaments and biological substances, initial encounter: Secondary | ICD-10-CM | POA: Diagnosis not present

## 2021-04-19 DIAGNOSIS — R569 Unspecified convulsions: Secondary | ICD-10-CM | POA: Diagnosis not present

## 2021-04-19 DIAGNOSIS — E118 Type 2 diabetes mellitus with unspecified complications: Secondary | ICD-10-CM | POA: Diagnosis not present

## 2021-04-20 ENCOUNTER — Ambulatory Visit: Payer: Federal, State, Local not specified - PPO | Admitting: Registered"

## 2021-04-20 ENCOUNTER — Encounter: Payer: Medicare Other | Attending: Family Medicine | Admitting: Registered"

## 2021-04-20 DIAGNOSIS — M7541 Impingement syndrome of right shoulder: Secondary | ICD-10-CM | POA: Diagnosis not present

## 2021-04-20 DIAGNOSIS — T50905A Adverse effect of unspecified drugs, medicaments and biological substances, initial encounter: Secondary | ICD-10-CM | POA: Diagnosis not present

## 2021-04-20 DIAGNOSIS — E118 Type 2 diabetes mellitus with unspecified complications: Secondary | ICD-10-CM | POA: Diagnosis not present

## 2021-04-20 DIAGNOSIS — R569 Unspecified convulsions: Secondary | ICD-10-CM | POA: Diagnosis not present

## 2021-04-20 DIAGNOSIS — I4891 Unspecified atrial fibrillation: Secondary | ICD-10-CM | POA: Diagnosis not present

## 2021-04-20 DIAGNOSIS — E876 Hypokalemia: Secondary | ICD-10-CM | POA: Diagnosis not present

## 2021-04-20 NOTE — Progress Notes (Signed)
Virtual Visit via Video Note  I connected with Suzanne Greer on 04/22/21 at  3:15 PM EDT by a video enabled telemedicine application and verified that I am speaking with the correct person using two identifiers.  Location: Patient: home  Daughter, Acquanetta Cabanilla, participated via phone.  Provider: [place] discussed the limitations of evaluation and management by telemedicine and the availability of in person appointments. The patient expressed understanding and agreed to proceed.  Patient states she prefers in-person appointments and will schedule next follow-up appt type to be in-person.  Diabetes Self-Management Education  Visit Type:  follow-up  Appt. Start Time: 1515 Appt. End Time: 1600  04/22/2021  [name], identified by name and date of birth, is a 70 y.o. female with a diagnosis of Diabetes:  Marland Kitchen Type f2  ASSESSMENT Patient continues to use Libre CGM. Pt states her daughter is helping with meal planning and participating in health care. Per CGM readings it appears patient is getting better control of blood sugar. Pt states she would like to understand carbs better so it is not so difficult to decide what to eat. Pt report that she is not enjoying food as much lately and would like to discuss options to improve enjoyment of eating.   Pt states when she wakes up in the middle of the night she has been scanning her sensor. Pt denies hypoglycemic sxs.  Pt states diarrhea that appeared to be associated with Ozempic continues even after discontinued medication. Pt reports the reason she was started on Ozempic is because her MD wanted to see if it would help her lose weight.      Education Topics covered: How to read Elenor Legato graphs to see time in range Limitations of CGM for low blood sugar When to scan and record blood sugar readings for future education visits   Patient Instructions  Continuous Glucose Monitor: Scan sensor 4x per day:  morning fasting, ~2 hrs after each meal If having hypoglycemic symptoms can scan at that time  Include protein at each meal, aim for ~30 grams of carbohydrates per meal and 15 for snacks  Return for follow-up visit to review carb counting and meal planning   Education material provided: none  If problems or questions, patient to contact team via:  Phone and MyChart  Follow Up Instructions: I discussed the assessment and treatment plan with the patient. The patient was provided an opportunity to ask questions and all were answered. The patient agreed with the plan and demonstrated an understanding of the instructions.   The patient was advised to call back or seek an in-person evaluation if the symptoms worsen or if the condition fails to improve as anticipated.  I provided 45 minutes of non-face-to-face time during this encounter.  Levie Heritage, RD, LDN, CDCES

## 2021-04-21 DIAGNOSIS — E612 Magnesium deficiency: Secondary | ICD-10-CM | POA: Diagnosis not present

## 2021-04-22 DIAGNOSIS — Z0000 Encounter for general adult medical examination without abnormal findings: Secondary | ICD-10-CM | POA: Diagnosis not present

## 2021-04-22 DIAGNOSIS — Z1389 Encounter for screening for other disorder: Secondary | ICD-10-CM | POA: Diagnosis not present

## 2021-04-22 NOTE — Patient Instructions (Addendum)
Continuous Glucose Monitor: Scan sensor 4x per day: morning fasting, ~2 hrs after each meal If having hypoglycemic symptoms can scan at that time  Include protein at each meal, aim for ~30 grams of carbohydrates per meal and 15 for snacks  Return for follow-up visit to review carb counting and meal planning

## 2021-04-25 ENCOUNTER — Encounter: Payer: Medicare Other | Admitting: Registered"

## 2021-04-25 ENCOUNTER — Encounter: Payer: Self-pay | Admitting: Registered"

## 2021-04-25 ENCOUNTER — Other Ambulatory Visit: Payer: Self-pay

## 2021-04-25 DIAGNOSIS — E118 Type 2 diabetes mellitus with unspecified complications: Secondary | ICD-10-CM

## 2021-04-25 NOTE — Progress Notes (Signed)
Diabetes Self-Management Education  Visit Type: Follow-up  Appt. Start Time: 0810 Appt. End Time: 0845  04/25/2021  [name], identified by name and date of birth, is a 70 y.o. female with a diagnosis of Diabetes: Type 2.   ASSESSMENT  There were no vitals taken for this visit. There is no height or weight on file to calculate BMI.  Medication: 1600 magnesium; 1000 mg metformin bid  Patient states told to continue with magnesium supplementation until her blood levels come up. Pt states she is frustrated because her BM continue to have "sludge" consistency with episodes of urgency.   Pt states since her hospital experience of seizures she continues with speech/cognitive therapy.   Pt states do Jacelyn Grip recommended book Eat to Live. Pt has read and attempting to make changes to match recommendations in book 1 lb cooked veggies, 1 lb raw veggies, beans for protein and nuts for fat. Pt also purchased other books to understand how to manage her diabetes and plans to start a binder to organize her information.   Patient is having some frustration with high blood sugar after meals with minimal carbs. Reviewed CGM data with patient to show the meal in question did not raise the blood sugar that much, but the starting blood sugar was elevated.      Diabetes Self-Management Education - 04/25/21 1400       Visit Information   Visit Type Follow-up      Initial Visit   Diabetes Type Type 2      Dietary Intake   Dinner spinach, squash, [place]      Patient Education   Nutrition management  Carbohydrate counting    Monitoring Other (comment)   using CGM data to evaluate meal effect on blood glucose     Individualized Goals (developed by patient)   Nutrition General guidelines for healthy choices and portions discussed   30 carbs per meal/15 carb per snack   Monitoring  test my blood glucose as discussed      Outcomes   Expected Outcomes Demonstrated interest in learning. Expect  positive outcomes    Future DMSE 4-6 wks    Program Status Not Completed      Subsequent Visit   Since your last visit have you continued or begun to take your medications as prescribed? Yes    Since your last visit, are you checking your blood glucose at least once a day? Yes   CGM            Individualized Plan for Diabetes Self-Management Training:   Learning Objective:  Patient will have a greater understanding of diabetes self-management. Patient education plan is to attend individual and/or group sessions per assessed needs and concerns.    Patient Instructions  Dowload libreview so you can she charts of your blood sugar  Evaluating your meal effect: Checking your blood sugar right before you eat and 90-120 min after eating  Evaluate how it is controlled while sleeping: check right before you go to sleep and when you wake up.  If high when you wake up have lower carb breakfast such as your protein shake  Aim for 2 carb servings per meal and 1 carb serving per snack  Continue working with your doctor regarding the magnesium you are taking. As it is reduced it should help make your diarrhea better.  Start eating foods with probiotics  Expected Outcomes:  Demonstrated interest in learning. Expect positive outcomes  Education material provided: [employer] (  novo nordisk)  If problems or questions, patient to contact team via:  Phone and Mychart  Future DSME appointment: 4-6 wks

## 2021-04-25 NOTE — Patient Instructions (Signed)
Dowload libreview so you can she charts of your blood sugar  Evaluating your meal effect: Checking your blood sugar right before you eat and 90-120 min after eating  Evaluate how it is controlled while sleeping: check right before you go to sleep and when you wake up.  If high when you wake up have lower carb breakfast such as your protein shake  Aim for 2 carb servings per meal and 1 carb serving per snack  Continue working with your doctor regarding the magnesium you are taking. As it is reduced it should help make your diarrhea better.  Start eating foods with probiotics

## 2021-04-26 DIAGNOSIS — E118 Type 2 diabetes mellitus with unspecified complications: Secondary | ICD-10-CM | POA: Diagnosis not present

## 2021-04-26 DIAGNOSIS — E876 Hypokalemia: Secondary | ICD-10-CM | POA: Diagnosis not present

## 2021-04-26 DIAGNOSIS — T50905A Adverse effect of unspecified drugs, medicaments and biological substances, initial encounter: Secondary | ICD-10-CM | POA: Diagnosis not present

## 2021-04-26 DIAGNOSIS — R569 Unspecified convulsions: Secondary | ICD-10-CM | POA: Diagnosis not present

## 2021-04-26 DIAGNOSIS — I4891 Unspecified atrial fibrillation: Secondary | ICD-10-CM | POA: Diagnosis not present

## 2021-04-27 DIAGNOSIS — K219 Gastro-esophageal reflux disease without esophagitis: Secondary | ICD-10-CM | POA: Diagnosis not present

## 2021-04-27 DIAGNOSIS — F331 Major depressive disorder, recurrent, moderate: Secondary | ICD-10-CM | POA: Diagnosis not present

## 2021-04-27 DIAGNOSIS — J45909 Unspecified asthma, uncomplicated: Secondary | ICD-10-CM | POA: Diagnosis not present

## 2021-04-27 DIAGNOSIS — E1140 Type 2 diabetes mellitus with diabetic neuropathy, unspecified: Secondary | ICD-10-CM | POA: Diagnosis not present

## 2021-04-27 DIAGNOSIS — H259 Unspecified age-related cataract: Secondary | ICD-10-CM | POA: Diagnosis not present

## 2021-04-27 DIAGNOSIS — G8929 Other chronic pain: Secondary | ICD-10-CM | POA: Diagnosis not present

## 2021-04-27 DIAGNOSIS — E1159 Type 2 diabetes mellitus with other circulatory complications: Secondary | ICD-10-CM | POA: Diagnosis not present

## 2021-04-27 DIAGNOSIS — E7800 Pure hypercholesterolemia, unspecified: Secondary | ICD-10-CM | POA: Diagnosis not present

## 2021-04-27 DIAGNOSIS — I4891 Unspecified atrial fibrillation: Secondary | ICD-10-CM | POA: Diagnosis not present

## 2021-04-27 DIAGNOSIS — G4700 Insomnia, unspecified: Secondary | ICD-10-CM | POA: Diagnosis not present

## 2021-04-27 DIAGNOSIS — E1149 Type 2 diabetes mellitus with other diabetic neurological complication: Secondary | ICD-10-CM | POA: Diagnosis not present

## 2021-04-29 DIAGNOSIS — R569 Unspecified convulsions: Secondary | ICD-10-CM | POA: Diagnosis not present

## 2021-04-29 DIAGNOSIS — E118 Type 2 diabetes mellitus with unspecified complications: Secondary | ICD-10-CM | POA: Diagnosis not present

## 2021-04-29 DIAGNOSIS — I4891 Unspecified atrial fibrillation: Secondary | ICD-10-CM | POA: Diagnosis not present

## 2021-04-29 DIAGNOSIS — E876 Hypokalemia: Secondary | ICD-10-CM | POA: Diagnosis not present

## 2021-04-29 DIAGNOSIS — T50905A Adverse effect of unspecified drugs, medicaments and biological substances, initial encounter: Secondary | ICD-10-CM | POA: Diagnosis not present

## 2021-05-05 DIAGNOSIS — T50905A Adverse effect of unspecified drugs, medicaments and biological substances, initial encounter: Secondary | ICD-10-CM | POA: Diagnosis not present

## 2021-05-05 DIAGNOSIS — R569 Unspecified convulsions: Secondary | ICD-10-CM | POA: Diagnosis not present

## 2021-05-05 DIAGNOSIS — Z9989 Dependence on other enabling machines and devices: Secondary | ICD-10-CM | POA: Diagnosis not present

## 2021-05-05 DIAGNOSIS — E876 Hypokalemia: Secondary | ICD-10-CM | POA: Diagnosis not present

## 2021-05-05 DIAGNOSIS — Z7901 Long term (current) use of anticoagulants: Secondary | ICD-10-CM | POA: Diagnosis not present

## 2021-05-05 DIAGNOSIS — I10 Essential (primary) hypertension: Secondary | ICD-10-CM | POA: Diagnosis not present

## 2021-05-05 DIAGNOSIS — E669 Obesity, unspecified: Secondary | ICD-10-CM | POA: Diagnosis not present

## 2021-05-05 DIAGNOSIS — E785 Hyperlipidemia, unspecified: Secondary | ICD-10-CM | POA: Diagnosis not present

## 2021-05-05 DIAGNOSIS — G4733 Obstructive sleep apnea (adult) (pediatric): Secondary | ICD-10-CM | POA: Diagnosis not present

## 2021-05-05 DIAGNOSIS — Z7984 Long term (current) use of oral hypoglycemic drugs: Secondary | ICD-10-CM | POA: Diagnosis not present

## 2021-05-05 DIAGNOSIS — E118 Type 2 diabetes mellitus with unspecified complications: Secondary | ICD-10-CM | POA: Diagnosis not present

## 2021-05-05 DIAGNOSIS — I4891 Unspecified atrial fibrillation: Secondary | ICD-10-CM | POA: Diagnosis not present

## 2021-05-05 DIAGNOSIS — Z9181 History of falling: Secondary | ICD-10-CM | POA: Diagnosis not present

## 2021-05-05 DIAGNOSIS — K219 Gastro-esophageal reflux disease without esophagitis: Secondary | ICD-10-CM | POA: Diagnosis not present

## 2021-05-06 DIAGNOSIS — E612 Magnesium deficiency: Secondary | ICD-10-CM | POA: Diagnosis not present

## 2021-05-11 DIAGNOSIS — E118 Type 2 diabetes mellitus with unspecified complications: Secondary | ICD-10-CM | POA: Diagnosis not present

## 2021-05-11 DIAGNOSIS — R569 Unspecified convulsions: Secondary | ICD-10-CM | POA: Diagnosis not present

## 2021-05-11 DIAGNOSIS — I4891 Unspecified atrial fibrillation: Secondary | ICD-10-CM | POA: Diagnosis not present

## 2021-05-11 DIAGNOSIS — E876 Hypokalemia: Secondary | ICD-10-CM | POA: Diagnosis not present

## 2021-05-11 DIAGNOSIS — T50905A Adverse effect of unspecified drugs, medicaments and biological substances, initial encounter: Secondary | ICD-10-CM | POA: Diagnosis not present

## 2021-05-20 ENCOUNTER — Encounter: Payer: Medicare Other | Attending: Family Medicine | Admitting: Registered"

## 2021-05-20 ENCOUNTER — Other Ambulatory Visit: Payer: Self-pay

## 2021-05-20 DIAGNOSIS — E118 Type 2 diabetes mellitus with unspecified complications: Secondary | ICD-10-CM | POA: Insufficient documentation

## 2021-05-20 DIAGNOSIS — R569 Unspecified convulsions: Secondary | ICD-10-CM | POA: Diagnosis not present

## 2021-05-20 DIAGNOSIS — T50905A Adverse effect of unspecified drugs, medicaments and biological substances, initial encounter: Secondary | ICD-10-CM | POA: Diagnosis not present

## 2021-05-20 DIAGNOSIS — E876 Hypokalemia: Secondary | ICD-10-CM | POA: Diagnosis not present

## 2021-05-20 DIAGNOSIS — I4891 Unspecified atrial fibrillation: Secondary | ICD-10-CM | POA: Diagnosis not present

## 2021-05-20 NOTE — Patient Instructions (Signed)
Do your grocery shopping 2 times per week to help not have too much food in your refrigerator.  For the Christmas tea plan ahead and be ready for temptations. Remember your success stories to keep you motivated  Have containers of prepped foods and label them by Protein, Carb, Vegetables.  Protein: Eggs, yogurt, cheese, [place], ham Carb: fruit, portioned out quinoa, sweet potatoes (cold or re-heated), corn Vegetable: variety  Get back into walking

## 2021-05-23 DIAGNOSIS — M722 Plantar fascial fibromatosis: Secondary | ICD-10-CM | POA: Diagnosis not present

## 2021-05-23 DIAGNOSIS — K219 Gastro-esophageal reflux disease without esophagitis: Secondary | ICD-10-CM | POA: Diagnosis not present

## 2021-05-23 DIAGNOSIS — E669 Obesity, unspecified: Secondary | ICD-10-CM | POA: Diagnosis not present

## 2021-05-23 DIAGNOSIS — I700 Atherosclerosis of aorta: Secondary | ICD-10-CM | POA: Diagnosis not present

## 2021-05-23 DIAGNOSIS — E1149 Type 2 diabetes mellitus with other diabetic neurological complication: Secondary | ICD-10-CM | POA: Diagnosis not present

## 2021-05-23 DIAGNOSIS — R197 Diarrhea, unspecified: Secondary | ICD-10-CM | POA: Diagnosis not present

## 2021-05-23 DIAGNOSIS — G4733 Obstructive sleep apnea (adult) (pediatric): Secondary | ICD-10-CM | POA: Diagnosis not present

## 2021-05-23 DIAGNOSIS — E559 Vitamin D deficiency, unspecified: Secondary | ICD-10-CM | POA: Diagnosis not present

## 2021-05-27 ENCOUNTER — Encounter: Payer: Self-pay | Admitting: Registered"

## 2021-05-27 NOTE — Progress Notes (Signed)
Diabetes Self-Management Education  Visit Type:  follow-up  Appt. Start Time: 1140 Appt. End Time: 1215  05/20/2021  [name], identified by name and date of birth, is a 70 y.o. female with a diagnosis of Diabetes:  Marland Kitchen Type 2  ASSESSMENT  There were no vitals taken for this visit. There is no height or weight on file to calculate BMI.  Pt states she checked her blood sugar after toast and egg and was 227 mg/dL  Pt reports she likes chicken salad chick restauratnt. Pt report found a recipe that she enjoys with a variety of vegetables, chicken, low sodium shick broth. Also eating stuffed cabbaged. Pt states she doesn't like to throw out food. Pt states she wants to be able to put together meals without too much effort of deciding what to have.  Pt states she has been having a problem with dry mouth.  Pt reports that she has GP appointment on Monday and hoping to get more answers about her issues with diarrhea. Pt states she was told to take immodium, and that they don't want to do a colonoscopy.  Individualized Plan for Diabetes Self-Management Training:   Learning Objective:  Patient will have a greater understanding of diabetes self-management. Patient education plan is to attend individual and/or group sessions per assessed needs and concerns.   Patient Instructions  Do your grocery shopping 2 times per week to help not have too much food in your refrigerator.  For the Christmas tea plan ahead and be ready for temptations. Remember your success stories to keep you motivated  Have containers of prepped foods and label them by Protein, Carb, Vegetables.  Protein: Eggs, yogurt, cheese, [place], ham Carb: fruit, portioned out quinoa, sweet potatoes (cold or re-heated), corn Vegetable: variety  Get back into walking  Expected Outcomes:   Demonstrated interest in learning. Expect positive outcomes  Education material provided: none  If problems or questions, patient  to contact team via:  Phone and MyChart  Future DSME appointment:

## 2021-06-06 ENCOUNTER — Encounter: Payer: Self-pay | Admitting: Registered"

## 2021-06-06 ENCOUNTER — Other Ambulatory Visit: Payer: Self-pay

## 2021-06-06 ENCOUNTER — Encounter: Payer: Medicare Other | Admitting: Registered"

## 2021-06-06 DIAGNOSIS — E118 Type 2 diabetes mellitus with unspecified complications: Secondary | ICD-10-CM

## 2021-06-06 DIAGNOSIS — Z86010 Personal history of colonic polyps: Secondary | ICD-10-CM | POA: Diagnosis not present

## 2021-06-06 DIAGNOSIS — R899 Unspecified abnormal finding in specimens from other organs, systems and tissues: Secondary | ICD-10-CM | POA: Diagnosis not present

## 2021-06-06 DIAGNOSIS — I4891 Unspecified atrial fibrillation: Secondary | ICD-10-CM | POA: Diagnosis not present

## 2021-06-06 DIAGNOSIS — R197 Diarrhea, unspecified: Secondary | ICD-10-CM | POA: Diagnosis not present

## 2021-06-06 DIAGNOSIS — R112 Nausea with vomiting, unspecified: Secondary | ICD-10-CM | POA: Diagnosis not present

## 2021-06-06 DIAGNOSIS — K2100 Gastro-esophageal reflux disease with esophagitis, without bleeding: Secondary | ICD-10-CM | POA: Diagnosis not present

## 2021-06-06 NOTE — Progress Notes (Signed)
Diabetes Self-Management Education  Visit Type: Follow-up  Appt. Start Time: 0848 Appt. End Time: 6712  06/06/2021  [name], identified by name and date of birth, is a 70 y.o. female with a diagnosis of Diabetes: Type 2.   ASSESSMENT  There were no vitals taken for this visit. There is no height or weight on file to calculate BMI.  A1c 7.2% 04/02/21 Medication: metformin  Libre data: Over all readings much improved over last 36 hours. Pt states she did not doing anything when she saw she had low blood sugar because in the past she panicked and over treated leading to spike in blood sugar. Pt states with recent episodes she just waited and blood sugar came back up on own.   Pt states she has appointment with GI doctor this morning. Pt states she plans to talk to him about concern that she might be having stricture in throat again. Pt states diarrhea continues to affect quality of life, not sleeping well due to fear of having an accident. Pt states she is taking pro-& pre biotics and immodium when needs extra help. Pt reports she is staying away from dairy due to diarrhea.   Pt states she has started prepping foods and placing in containers labeled Protein, Carb, Vegetables, and states this is helping a little bit  Pt states she is not getting much physical activity due to diarrhea. Looking forward to getting this issue resolved so she can get back to the pool exercising.   Pt states she has been eating out a lot with friends for Chrsitmas, but feels she is making better choices such as small portion of ribs and having salad instead of other carbs.  Pt states for accountability next month she plans to go back to her Next 56 days group and do her own plan but have the social support.   Individualized Plan for Diabetes Self-Management Training:   Learning Objective:  Patient will have a greater understanding of diabetes self-management. Patient education plan is to attend  individual and/or group sessions per assessed needs and concerns.   Patient Instructions  Write down what you are eating and look for correction with your blood sugar.  Continue using nutritional shakes when in a hurry or for snacks.  Continue cooking at home and including more protein. Continue with your plan to have Canadian bacon instead of regular bacon. Doing meal prepping with your 56 days group  Expected Outcomes:  Demonstrated interest in learning. Expect positive outcomes  Education material provided: none  If problems or questions, patient to contact team via:  Phone and Mychart  Future DSME appointment: PRN

## 2021-06-06 NOTE — Patient Instructions (Addendum)
Write down what you are eating and look for correction with your blood sugar.  Continue using nutritional shakes when in a hurry or for snacks.  Continue cooking at home and including more protein. Continue with your plan to have Canadian bacon instead of regular bacon. Doing meal prepping with your 56 days group

## 2021-06-07 ENCOUNTER — Telehealth: Payer: Self-pay | Admitting: Cardiology

## 2021-06-07 NOTE — Telephone Encounter (Signed)
° °  Pre-operative Risk Assessment    Patient Name: Suzanne Greer  DOB: [birth year] MRN: [id number]      Request for Surgical Clearance    Procedure:  colonoscopy/endoscopy   Date of Surgery:  Clearance 09/08/21                                 Surgeon:  Dr. Alessandra Bevels Surgeon's Group or Practice Name:  [facility] Phone number:  [phone] Fax number:  [phone]   Type of Clearance Requested:   - Medical  - Pharmacy:  Hold Apixaban (Eliquis) 2 days   Type of Anesthesia:   Propofol    Additional requests/questions:    Lonia Chimera   06/07/2021, 5:03 PM

## 2021-06-08 NOTE — Telephone Encounter (Signed)
° °  Primary Cardiologist: Peter Martinique, MD  Chart reviewed as part of pre-operative protocol coverage. Given past medical history and time since last visit, based on ACC/AHA guidelines, Suzanne Greer would be at acceptable risk for the planned procedure without further cardiovascular testing.   Patient with diagnosis of A Fib on Eliquis for anticoagulation.     Procedure:  colonoscopy/endoscopy  Date of procedure: 09/08/21   CHA2DS2-VASc Score = 5  This indicates a 7.2% annual risk of stroke. The patient's score is based upon: CHF History: 0 HTN History: 1 Diabetes History: 1 Stroke History: 0 Vascular Disease History: 1 Age Score: 1 Gender Score: 1   CrCl 85 mL/min Platelet count 325K   Per office protocol, patient can hold Eliquis for 2 days prior to procedure.  I will route this recommendation to the requesting party via Epic fax function and remove from pre-op pool.  Please call with questions.  [name] [name] NP-C    06/08/2021, 8:55 AM [place] [address] Office [phone] Fax [phone]

## 2021-06-08 NOTE — Telephone Encounter (Signed)
Patient with diagnosis of A Fib on Eliquis for anticoagulation.    Procedure:  colonoscopy/endoscopy  Date of procedure: 09/08/21  CHA2DS2-VASc Score = 5  This indicates a 7.2% annual risk of stroke. The patient's score is based upon: CHF History: 0 HTN History: 1 Diabetes History: 1 Stroke History: 0 Vascular Disease History: 1 Age Score: 1 Gender Score: 1   CrCl 85 mL/min Platelet count 325K  Per office protocol, patient can hold Eliquis for 2 days prior to procedure.

## 2021-06-10 DIAGNOSIS — R059 Cough, unspecified: Secondary | ICD-10-CM | POA: Diagnosis not present

## 2021-06-10 DIAGNOSIS — U071 COVID-19: Secondary | ICD-10-CM | POA: Diagnosis not present

## 2021-06-19 DIAGNOSIS — Z20822 Contact with and (suspected) exposure to covid-19: Secondary | ICD-10-CM | POA: Diagnosis not present

## 2021-06-27 DIAGNOSIS — H259 Unspecified age-related cataract: Secondary | ICD-10-CM | POA: Diagnosis not present

## 2021-06-27 DIAGNOSIS — G8929 Other chronic pain: Secondary | ICD-10-CM | POA: Diagnosis not present

## 2021-06-27 DIAGNOSIS — I4891 Unspecified atrial fibrillation: Secondary | ICD-10-CM | POA: Diagnosis not present

## 2021-06-27 DIAGNOSIS — J45909 Unspecified asthma, uncomplicated: Secondary | ICD-10-CM | POA: Diagnosis not present

## 2021-06-27 DIAGNOSIS — G4700 Insomnia, unspecified: Secondary | ICD-10-CM | POA: Diagnosis not present

## 2021-06-27 DIAGNOSIS — K219 Gastro-esophageal reflux disease without esophagitis: Secondary | ICD-10-CM | POA: Diagnosis not present

## 2021-06-27 DIAGNOSIS — E1149 Type 2 diabetes mellitus with other diabetic neurological complication: Secondary | ICD-10-CM | POA: Diagnosis not present

## 2021-06-27 DIAGNOSIS — E1159 Type 2 diabetes mellitus with other circulatory complications: Secondary | ICD-10-CM | POA: Diagnosis not present

## 2021-06-27 DIAGNOSIS — E1140 Type 2 diabetes mellitus with diabetic neuropathy, unspecified: Secondary | ICD-10-CM | POA: Diagnosis not present

## 2021-06-27 DIAGNOSIS — F331 Major depressive disorder, recurrent, moderate: Secondary | ICD-10-CM | POA: Diagnosis not present

## 2021-06-27 DIAGNOSIS — E7800 Pure hypercholesterolemia, unspecified: Secondary | ICD-10-CM | POA: Diagnosis not present

## 2021-07-06 DIAGNOSIS — E1149 Type 2 diabetes mellitus with other diabetic neurological complication: Secondary | ICD-10-CM | POA: Diagnosis not present

## 2021-07-06 DIAGNOSIS — R197 Diarrhea, unspecified: Secondary | ICD-10-CM | POA: Diagnosis not present

## 2021-07-11 DIAGNOSIS — E1149 Type 2 diabetes mellitus with other diabetic neurological complication: Secondary | ICD-10-CM | POA: Diagnosis not present

## 2021-07-11 DIAGNOSIS — I4891 Unspecified atrial fibrillation: Secondary | ICD-10-CM | POA: Diagnosis not present

## 2021-07-11 DIAGNOSIS — R197 Diarrhea, unspecified: Secondary | ICD-10-CM | POA: Diagnosis not present

## 2021-07-11 DIAGNOSIS — G4733 Obstructive sleep apnea (adult) (pediatric): Secondary | ICD-10-CM | POA: Diagnosis not present

## 2021-07-11 DIAGNOSIS — E7800 Pure hypercholesterolemia, unspecified: Secondary | ICD-10-CM | POA: Diagnosis not present

## 2021-07-11 DIAGNOSIS — E669 Obesity, unspecified: Secondary | ICD-10-CM | POA: Diagnosis not present

## 2021-07-27 DIAGNOSIS — B351 Tinea unguium: Secondary | ICD-10-CM | POA: Diagnosis not present

## 2021-07-27 DIAGNOSIS — E119 Type 2 diabetes mellitus without complications: Secondary | ICD-10-CM | POA: Diagnosis not present

## 2021-07-27 DIAGNOSIS — M79672 Pain in left foot: Secondary | ICD-10-CM | POA: Diagnosis not present

## 2021-07-27 DIAGNOSIS — M79671 Pain in right foot: Secondary | ICD-10-CM | POA: Diagnosis not present

## 2021-08-09 DIAGNOSIS — Z1231 Encounter for screening mammogram for malignant neoplasm of breast: Secondary | ICD-10-CM | POA: Diagnosis not present

## 2021-08-24 ENCOUNTER — Other Ambulatory Visit: Payer: Self-pay | Admitting: Cardiology

## 2021-08-30 DIAGNOSIS — Z20822 Contact with and (suspected) exposure to covid-19: Secondary | ICD-10-CM | POA: Diagnosis not present

## 2021-09-07 ENCOUNTER — Other Ambulatory Visit: Payer: Self-pay | Admitting: Obstetrics and Gynecology

## 2021-09-07 ENCOUNTER — Other Ambulatory Visit: Payer: Self-pay

## 2021-09-07 ENCOUNTER — Ambulatory Visit
Admission: RE | Admit: 2021-09-07 | Discharge: 2021-09-07 | Disposition: A | Discharge status: Home / Self Care | Payer: No Typology Code available for payment source | Source: Ambulatory Visit | Attending: Obstetrics and Gynecology | Admitting: Obstetrics and Gynecology

## 2021-09-07 DIAGNOSIS — Z111 Encounter for screening for respiratory tuberculosis: Secondary | ICD-10-CM

## 2021-09-08 DIAGNOSIS — K219 Gastro-esophageal reflux disease without esophagitis: Secondary | ICD-10-CM | POA: Diagnosis not present

## 2021-09-08 DIAGNOSIS — D175 Benign lipomatous neoplasm of intra-abdominal organs: Secondary | ICD-10-CM | POA: Diagnosis not present

## 2021-09-08 DIAGNOSIS — K6289 Other specified diseases of anus and rectum: Secondary | ICD-10-CM | POA: Diagnosis not present

## 2021-09-08 DIAGNOSIS — K2930 Chronic superficial gastritis without bleeding: Secondary | ICD-10-CM | POA: Diagnosis not present

## 2021-09-08 DIAGNOSIS — K648 Other hemorrhoids: Secondary | ICD-10-CM | POA: Diagnosis not present

## 2021-09-08 DIAGNOSIS — D123 Benign neoplasm of transverse colon: Secondary | ICD-10-CM | POA: Diagnosis not present

## 2021-09-08 DIAGNOSIS — K2950 Unspecified chronic gastritis without bleeding: Secondary | ICD-10-CM | POA: Diagnosis not present

## 2021-09-08 DIAGNOSIS — R12 Heartburn: Secondary | ICD-10-CM | POA: Diagnosis not present

## 2021-09-08 DIAGNOSIS — K2210 Ulcer of esophagus without bleeding: Secondary | ICD-10-CM | POA: Diagnosis not present

## 2021-09-08 DIAGNOSIS — R112 Nausea with vomiting, unspecified: Secondary | ICD-10-CM | POA: Diagnosis not present

## 2021-09-08 DIAGNOSIS — Z86010 Personal history of colonic polyps: Secondary | ICD-10-CM | POA: Diagnosis not present

## 2021-09-12 DIAGNOSIS — E7800 Pure hypercholesterolemia, unspecified: Secondary | ICD-10-CM | POA: Diagnosis not present

## 2021-09-12 DIAGNOSIS — E1149 Type 2 diabetes mellitus with other diabetic neurological complication: Secondary | ICD-10-CM | POA: Diagnosis not present

## 2021-09-15 DIAGNOSIS — G4733 Obstructive sleep apnea (adult) (pediatric): Secondary | ICD-10-CM | POA: Diagnosis not present

## 2021-09-15 DIAGNOSIS — K219 Gastro-esophageal reflux disease without esophagitis: Secondary | ICD-10-CM | POA: Diagnosis not present

## 2021-09-15 DIAGNOSIS — D123 Benign neoplasm of transverse colon: Secondary | ICD-10-CM | POA: Diagnosis not present

## 2021-09-15 DIAGNOSIS — E669 Obesity, unspecified: Secondary | ICD-10-CM | POA: Diagnosis not present

## 2021-09-15 DIAGNOSIS — E1140 Type 2 diabetes mellitus with diabetic neuropathy, unspecified: Secondary | ICD-10-CM | POA: Diagnosis not present

## 2021-09-15 DIAGNOSIS — E7800 Pure hypercholesterolemia, unspecified: Secondary | ICD-10-CM | POA: Diagnosis not present

## 2021-09-15 DIAGNOSIS — E890 Postprocedural hypothyroidism: Secondary | ICD-10-CM | POA: Diagnosis not present

## 2021-09-15 DIAGNOSIS — E1149 Type 2 diabetes mellitus with other diabetic neurological complication: Secondary | ICD-10-CM | POA: Diagnosis not present

## 2021-09-15 DIAGNOSIS — E1159 Type 2 diabetes mellitus with other circulatory complications: Secondary | ICD-10-CM | POA: Diagnosis not present

## 2021-09-15 DIAGNOSIS — G2581 Restless legs syndrome: Secondary | ICD-10-CM | POA: Diagnosis not present

## 2021-09-15 DIAGNOSIS — K2930 Chronic superficial gastritis without bleeding: Secondary | ICD-10-CM | POA: Diagnosis not present

## 2021-09-15 DIAGNOSIS — E612 Magnesium deficiency: Secondary | ICD-10-CM | POA: Diagnosis not present

## 2021-09-29 DIAGNOSIS — H0102A Squamous blepharitis right eye, upper and lower eyelids: Secondary | ICD-10-CM | POA: Diagnosis not present

## 2021-09-29 DIAGNOSIS — H0102B Squamous blepharitis left eye, upper and lower eyelids: Secondary | ICD-10-CM | POA: Diagnosis not present

## 2021-09-29 DIAGNOSIS — H04123 Dry eye syndrome of bilateral lacrimal glands: Secondary | ICD-10-CM | POA: Diagnosis not present

## 2021-09-29 DIAGNOSIS — H353131 Nonexudative age-related macular degeneration, bilateral, early dry stage: Secondary | ICD-10-CM | POA: Diagnosis not present

## 2021-09-29 DIAGNOSIS — E119 Type 2 diabetes mellitus without complications: Secondary | ICD-10-CM | POA: Diagnosis not present

## 2021-09-29 DIAGNOSIS — H43811 Vitreous degeneration, right eye: Secondary | ICD-10-CM | POA: Diagnosis not present

## 2021-09-29 DIAGNOSIS — Z961 Presence of intraocular lens: Secondary | ICD-10-CM | POA: Diagnosis not present

## 2021-10-02 DIAGNOSIS — Z20822 Contact with and (suspected) exposure to covid-19: Secondary | ICD-10-CM | POA: Diagnosis not present

## 2021-10-04 DIAGNOSIS — E1149 Type 2 diabetes mellitus with other diabetic neurological complication: Secondary | ICD-10-CM | POA: Diagnosis not present

## 2021-10-04 DIAGNOSIS — E7800 Pure hypercholesterolemia, unspecified: Secondary | ICD-10-CM | POA: Diagnosis not present

## 2021-10-21 DIAGNOSIS — Z20822 Contact with and (suspected) exposure to covid-19: Secondary | ICD-10-CM | POA: Diagnosis not present

## 2021-11-03 DIAGNOSIS — Z111 Encounter for screening for respiratory tuberculosis: Secondary | ICD-10-CM | POA: Diagnosis not present

## 2021-11-03 DIAGNOSIS — R7612 Nonspecific reaction to cell mediated immunity measurement of gamma interferon antigen response without active tuberculosis: Secondary | ICD-10-CM | POA: Diagnosis not present

## 2021-11-11 ENCOUNTER — Other Ambulatory Visit: Payer: Self-pay | Admitting: Cardiology

## 2021-11-11 NOTE — Telephone Encounter (Signed)
Prescription refill request for Eliquis received. Indication:Afib Last office visit:10/22 Scr:0.7 Age: 71 Weight:96.6 kg  Prescription refilled

## 2021-11-19 NOTE — Progress Notes (Unsigned)
Cardiology Office Note   Date:  11/23/2021   ID:  Suzanne, Greer May 12, 1951, MRN [id number]  PCP:  Suzanne Shanks, MD  Cardiologist:   Suleyman Ehrman Martinique, MD   Chief Complaint  Patient presents with   Follow-up    1 year.   Atrial Flutter   Atrial Fibrillation     History of Present Illness: Suzanne Greer is a 71 y.o. female who is seen for follow up Atrial fibrillation/flutter. She has a history of HTN, OSA, and DM.  Has been in Rhythmol and Eliquis for years. She had a remote Echo in 2011 that showed Mild LVH otherwise normal. She reports a Myoview study years ago at [place] that apparently was normal. She is on CPAP therapy. She reports that in December she was having increased symptoms with Afib and was placed on diltiazem in addition to propafenone and metoprolol. She states she was having episodes at least 2 days a week. When her HR is over 100 she feels palpitations. When HR is up to 150 she feels extremely tired and has chest tightness and pressure like something sitting on her chest. She does have chronic dizziness but attributes this to Meniere's disease. She does not drink caffeine.  Echo in February 2021 was normal.  She was admitted to hospital service in October 2022 secondary to nausea vomiting and actually had 1 episode of witnessed seizure in the ED as well. Initially at [facility] she had some Afib with RVR.  She was found to have significantly low magnesium.  Seizure was thought to be secondary to severe hypomagnesemia. N/V felt to be related to Ozempic.   On follow up today she feels well. She does have a heart rate monitor and over the past 2 months she has had much more elevated HRs sometimes up in the 134 range. She is taking propafenone twice a day but if out of rhythm tries to take the third dose. States she can no longer afford Eliquis and wants to consider transition to Warfarin. She denies any dyspnea or chest pain. No dizziness.    Past  Medical History:  Diagnosis Date   Atrial fibrillation ([place])    Back pain    Diabetes mellitus    Dysrhythmia    history Atrial Flutter. 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   Obesity    Restless legs    Sleep apnea    no cpap use now-unable to tolerate mask, surgery a yr ago   Vertigo     Past Surgical History:  Procedure Laterality Date   APPENDECTOMY     BUNIONECTOMY     ESOPHAGOGASTRODUODENOSCOPY (EGD) WITH PROPOFOL N/A 08/16/2015   Procedure: ESOPHAGOGASTRODUODENOSCOPY (EGD) WITH PROPOFOL;  Surgeon: Garlan Fair, MD;  Location: [facility] END[facility]COPY;  Service: Endoscopy;  Laterality: N/A;   GANGLION CYST   1980   REMOVED    HEMORROIDECTOMY     THYROIDECTOMY, PARTIAL  2005   "partial for growth-benign"   UPPER ENDOSCOPIC ULTRASOUND W/ FNA     UVULECTOMY     excised about a yr ago [facility]     Current Outpatient Medications  Medication Sig Dispense Refill   albuterol (VENTOLIN  HFA) 108 (90 Base) MCG/ACT inhaler Inhale 2 puffs into the lungs every 6 (six) hours as needed for wheezing or shortness of breath.     atorvastatin (LIPITOR) 20 MG tablet Take 20 mg by mouth daily.     cyclobenzaprine (FLEXERIL) 10 MG tablet TAKE 1 TABLET BY MOUTH EVERYDAY AT BEDTIME. Call [phone] to schedule yearly follow up for ongoing refills (Patient taking differently: Take 10 mg by mouth at bedtime as needed for muscle spasms. Call [phone] to schedule yearly follow up for ongoing refills) 15 tablet 0   diltiazem (CARDIZEM CD) 120 MG 24 hr capsule TAKE 1 CAPSULE BY MOUTH EVERY DAY 90 capsule 3   ELIQUIS 5 MG TABS tablet TAKE 1 TABLET BY MOUTH TWICE A DAY 180 tablet 3   fluticasone (FLONASE) 50 MCG/ACT nasal spray Place 2 sprays into both  nostrils daily.     Magnesium 400 MG TABS Take 400 mg by mouth daily.      Melatonin 10 MG TABS Take 10 mg by mouth at bedtime.      metFORMIN (GLUCOPHAGE-XR) 500 MG 24 hr tablet Take 1,000 mg by mouth 2 (two) times daily.      metoprolol succinate (TOPROL-XL) 50 MG 24 hr tablet Take 1 tablet (50 mg total) by mouth daily. 90 tablet 3   montelukast (SINGULAIR) 10 MG tablet Take 10 mg by mouth at bedtime.     omeprazole (PRILOSEC) 40 MG capsule Take 40 mg by mouth daily.     propafenone (RYTHMOL) 300 MG tablet TAKE 1 TABLET (300 MG TOTAL) BY MOUTH EVERY 8 (EIGHT) HOURS. 270 tablet 3   traMADol (ULTRAM) 50 MG tablet Take 50 mg by mouth 3 (three) times daily as needed (Pain).      VITAMIN D PO Take 1 tablet by mouth daily.     No current facility-administered medications for this visit.    Allergies:   Doxycycline, Ozempic (0.25 or 0.5 mg-dose) [semaglutide(0.25 or 0.'5mg'$ -dos)], Yeast-related products, Molds & smuts, Penicillins, and Sulfa drugs cross reactors    Social History:  The patient  reports that she has never smoked. She has never used smokeless tobacco. She reports that she does not drink alcohol and does not use drugs.   Family History:  The patient's family history includes Cancer (age of onset: 38) in her mother; Diabetes in her father; Drug abuse in her brother; Heart disease in her mother; Hepatitis C in her brother.    ROS:  Please see the history of present illness.   Otherwise, review of systems are positive for none.   All other systems are reviewed and negative.    PHYSICAL EXAM: VS:  BP 116/60 (BP Location: Left Arm, Patient Position: Sitting, Cuff Size: Large)   Pulse 96   Ht '5\' 5"'$  (1.651 m)   Wt 217 lb (98.4 kg)   BMI 36.11 kg/m  , BMI Body mass index is 36.11 kg/m. GEN: Well nourished, overweight, in no acute distress  HEENT: normal  Neck: no JVD, carotid bruits, or masses Cardiac: IRRR; no murmurs, rubs, or gallops,no edema  Respiratory:  clear to  auscultation bilaterally, normal work of breathing GI: soft, nontender, nondistended, + BS MS: no deformity or atrophy  Skin: warm and dry, no rash Neuro:  Strength and sensation are intact Psych: euthymic mood, full affect   EKG:  EKG is  ordered today. Atrial flutter with variable AV conduction. Rate 96. Low voltage. I have personally reviewed and interpreted this study.  Recent Labs: 04/01/2021: ALT 13 04/04/2021: BUN 5; Creatinine, Ser 0.72; Hemoglobin 11.7; Magnesium 1.9; Platelets 325; Potassium 4.3; Sodium 137    Lipid Panel No results found for: CHOL, TRIG, HDL, CHOLHDL, VLDL, LDLCALC, LDLDIRECT   Dated 09/25/18: A1c 7.6%. cholesterol 216, triglycerides 134, HDL 56, LDL 133. CMET and TSH normal. Dated 10/21/19: A1c 7.2%. cholesterol 157, triglycerides 185, HDL 54, LDL 72. Glucose 160 otherwise CMET normal.  Dated 04/23/20: A1c 7%. Cholesterol 200, triglycerides 161, HDL 55, LDL 117. Glucose 154 otherwise CMET and TSH normal.  Dated 03/23/21: cholesterol 150, triglycerides 157, HDL 48, LDL 84. A1c 7.2%.  Dated 3/27//23: cholesterol 149, triglycerides 116, HDL 61, LDL 68 Dated 10/04/21: A1c 6.9%. CMET normal.  Wt Readings from Last 3 Encounters:  11/23/21 217 lb (98.4 kg)  04/11/21 213 lb (96.6 kg)  04/04/21 213 lb 6.5 oz (96.8 kg)      Other studies Reviewed: Additional studies/ records that were reviewed today include:   Echo 07/30/19: IMPRESSIONS     1. Left ventricular ejection fraction by 3D volume is 57 %. The left  ventricle has normal function. The left ventrical has no regional wall  motion abnormalities. There is mildly increased concentric left  ventricular hypertrophy. Left ventricular  diastolic parameters are consistent with Grade I diastolic dysfunction  (impaired relaxation). The average left ventricular global longitudinal  strain is -18.5 %.   2. Right ventricular systolic function is mildly reduced. The right  ventricular size is normal. Tricuspid  regurgitation signal is inadequate  for assessing PA pressure.   3. The mitral valve is normal in structure and function. trivial mitral  valve regurgitation. No evidence of mitral stenosis.   4. The aortic valve is normal in structure and function. Aortic valve  regurgitation is not visualized. No aortic stenosis is present.   ASSESSMENT AND PLAN:  1.  Paroxysmal Atrial fibrillation/ Flutter Patient on long term AAD therapy with propafenone. On Diltiazem and Toprol for rate control.   She has generally been doing OK on twice a day dosing. It appears she is having increased break through based on her HR monitor. She is going to try and   increase propafenone to tid. She has a [place] Vasc score of  3-4 so is on Eliquis now. Will arrange follow up with Pharm D since she may need to switch to Coumadin for cost. I would like for her to see EP for consideration of ablation.  2. DM type 2. Per PCP 3. HLD. On lipitor 4. OSA on CPAP. Stressed importance of keeping weight under control.   Current medicines are reviewed at length with the patient today.  The patient does not have concerns regarding medicines.  The following changes have been made:  increase propafenone to tid. Follow up with Pharm D for anticoagulation issues.   Labs/ tests ordered today include:   Orders Placed This Encounter  Procedures   Ambulatory referral to Cardiac Electrophysiology   EKG 12-Lead      Disposition:   FU with me in 6 months.   Signed, Cace Osorto Martinique, MD  11/23/2021 9:20 AM    [facility] [address] [place], [place], [address] Phone [phone], Fax [phone]

## 2021-11-21 DIAGNOSIS — M79671 Pain in right foot: Secondary | ICD-10-CM | POA: Diagnosis not present

## 2021-11-21 DIAGNOSIS — E119 Type 2 diabetes mellitus without complications: Secondary | ICD-10-CM | POA: Diagnosis not present

## 2021-11-21 DIAGNOSIS — L89892 Pressure ulcer of other site, stage 2: Secondary | ICD-10-CM | POA: Diagnosis not present

## 2021-11-21 DIAGNOSIS — M79672 Pain in left foot: Secondary | ICD-10-CM | POA: Diagnosis not present

## 2021-11-21 DIAGNOSIS — B351 Tinea unguium: Secondary | ICD-10-CM | POA: Diagnosis not present

## 2021-11-21 DIAGNOSIS — E11621 Type 2 diabetes mellitus with foot ulcer: Secondary | ICD-10-CM | POA: Diagnosis not present

## 2021-11-23 ENCOUNTER — Ambulatory Visit (INDEPENDENT_AMBULATORY_CARE_PROVIDER_SITE_OTHER): Payer: Medicare Other | Admitting: Cardiology

## 2021-11-23 ENCOUNTER — Encounter: Payer: Self-pay | Admitting: Cardiology

## 2021-11-23 VITALS — BP 116/60 | HR 96 | Ht 65.0 in | Wt 217.0 lb

## 2021-11-23 DIAGNOSIS — E7800 Pure hypercholesterolemia, unspecified: Secondary | ICD-10-CM

## 2021-11-23 DIAGNOSIS — I480 Paroxysmal atrial fibrillation: Secondary | ICD-10-CM

## 2021-11-23 DIAGNOSIS — G4733 Obstructive sleep apnea (adult) (pediatric): Secondary | ICD-10-CM

## 2021-11-23 DIAGNOSIS — I483 Typical atrial flutter: Secondary | ICD-10-CM | POA: Diagnosis not present

## 2021-11-23 NOTE — Patient Instructions (Addendum)
Medication Instructions:  The pharmacy team will see you to change from Eliquis >> Warfarin   *If you need a refill on your cardiac medications before your next appointment, please call your pharmacy*   Follow-Up: At [facility], you and your health needs are our priority.  As part of our continuing mission to provide you with exceptional heart care, we have created designated Provider Care Teams.  These Care Teams include your primary Cardiologist (physician) and Advanced Practice Providers (APPs -  Physician Assistants and Nurse Practitioners) who all work together to provide you with the care you need, when you need it.  We recommend signing up for the patient portal called "MyChart".  Sign up information is provided on this After Visit Summary.  MyChart is used to connect with patients for Virtual Visits (Telemedicine).  Patients are able to view lab/test results, encounter notes, upcoming appointments, etc.  Non-urgent messages can be sent to your provider as well.   To learn more about what you can do with MyChart, go to [email or web address].    Your next appointment:   6 month(s)  The format for your next appointment:   In Person  Provider:   Peter Martinique, MD {   Other Instructions  Please schedule appointment with Clinical Pharmacist to discuss anticoagulation change from Eliquis >> Warfarin   You have been referred to Dr. Lars Mage - cardiac electrophysiologist He has clinic at [place]. [place]

## 2021-11-25 DIAGNOSIS — R051 Acute cough: Secondary | ICD-10-CM | POA: Diagnosis not present

## 2021-12-05 ENCOUNTER — Telehealth: Payer: Self-pay | Admitting: Pharmacist

## 2021-12-05 ENCOUNTER — Ambulatory Visit: Payer: Federal, State, Local not specified - PPO

## 2021-12-05 DIAGNOSIS — K449 Diaphragmatic hernia without obstruction or gangrene: Secondary | ICD-10-CM | POA: Diagnosis not present

## 2021-12-05 DIAGNOSIS — K2970 Gastritis, unspecified, without bleeding: Secondary | ICD-10-CM | POA: Diagnosis not present

## 2021-12-05 DIAGNOSIS — K222 Esophageal obstruction: Secondary | ICD-10-CM | POA: Diagnosis not present

## 2021-12-05 DIAGNOSIS — K2100 Gastro-esophageal reflux disease with esophagitis, without bleeding: Secondary | ICD-10-CM | POA: Diagnosis not present

## 2021-12-05 NOTE — Telephone Encounter (Signed)
Patient scheduled for appointment today to transition from Eliquis to warfarin.  Looked at insurance card and she has a [employer].  Called patient to see if she had ever used a copay card.  Activated copay card for patient online and had [facility] email card to her.  Patient appreciative.  Canceled appointment.

## 2021-12-22 DIAGNOSIS — M797 Fibromyalgia: Secondary | ICD-10-CM | POA: Diagnosis not present

## 2021-12-22 DIAGNOSIS — K2950 Unspecified chronic gastritis without bleeding: Secondary | ICD-10-CM | POA: Diagnosis not present

## 2021-12-22 DIAGNOSIS — I4891 Unspecified atrial fibrillation: Secondary | ICD-10-CM | POA: Diagnosis not present

## 2021-12-22 DIAGNOSIS — E612 Magnesium deficiency: Secondary | ICD-10-CM | POA: Diagnosis not present

## 2021-12-22 DIAGNOSIS — E559 Vitamin D deficiency, unspecified: Secondary | ICD-10-CM | POA: Diagnosis not present

## 2021-12-22 DIAGNOSIS — E1149 Type 2 diabetes mellitus with other diabetic neurological complication: Secondary | ICD-10-CM | POA: Diagnosis not present

## 2021-12-22 DIAGNOSIS — Z79899 Other long term (current) drug therapy: Secondary | ICD-10-CM | POA: Diagnosis not present

## 2022-01-02 DIAGNOSIS — E669 Obesity, unspecified: Secondary | ICD-10-CM | POA: Diagnosis not present

## 2022-01-02 DIAGNOSIS — G4733 Obstructive sleep apnea (adult) (pediatric): Secondary | ICD-10-CM | POA: Diagnosis not present

## 2022-01-02 DIAGNOSIS — E119 Type 2 diabetes mellitus without complications: Secondary | ICD-10-CM | POA: Diagnosis not present

## 2022-01-02 DIAGNOSIS — I4891 Unspecified atrial fibrillation: Secondary | ICD-10-CM | POA: Diagnosis not present

## 2022-01-04 DIAGNOSIS — Z79899 Other long term (current) drug therapy: Secondary | ICD-10-CM | POA: Diagnosis not present

## 2022-01-04 DIAGNOSIS — E559 Vitamin D deficiency, unspecified: Secondary | ICD-10-CM | POA: Diagnosis not present

## 2022-01-04 DIAGNOSIS — E1149 Type 2 diabetes mellitus with other diabetic neurological complication: Secondary | ICD-10-CM | POA: Diagnosis not present

## 2022-01-04 DIAGNOSIS — E612 Magnesium deficiency: Secondary | ICD-10-CM | POA: Diagnosis not present

## 2022-01-11 NOTE — Progress Notes (Signed)
Electrophysiology Office Note:    Date:  01/12/2022   ID:  Suzanne, Greer 12-15-1950, MRN [id number]  PCP:  Suzanne Shanks, MD (Inactive)  [facility] Cardiologist:  Suzanne Martinique, MD  [place] Electrophysiologist:  Suzanne Epley, MD   Referring MD: Greer, Suzanne M, MD   Chief Complaint: New patient consult for Afib/Aflutter ablation  History of Present Illness:    Suzanne Greer is a 71 y.o. female who presents for an evaluation for Afib/Aflutter ablation at the request of Dr. Martinique. Their medical history includes atrial fibrillation, atrial flutter, hypertension, hyperlipidemia, diabetes, fibromyalgia, GERD, Meniere disease, obesity, and OSA on CPAP.  She saw Dr. Martinique on 11/23/2021. It was noted that she was in Afib with RVR 03/2021 at [facility]. She was admitted to the hospital secondary to nausea, vomiting, and 1 episode of witnessed seizure in the ED (felt to be secondary to severe hypomagnesemia). At her appointment with Dr. Martinique she reported heart rates as high as the 130's per her heart rate monitor. She was taking propafenone 2x daily, but would take a third dose if out of rhythm. Her EKG showed Atrial flutter with variable AV conduction. Rate 96. Low voltage. She wanted to consider switching from Eliquis to Warfarin due to expensive costs. Propafenone was increased to TID. She was referred to EP for consideration of ablation.  Today, she reports that her initial episode of atrial fibrillation was found a long time ago. She had gone to the gym, walked up the stairs and went on an exercise bike. Her heart rate was measured at 120 bpm prior to beginning her exercise.  When she is in atrial fibrillation, she usually feels fatigued. If her heart rate goes above 100 bpm, she begins to feel a "chest tightening." Currently she is not sure how often her episodes of Afib occur; she generally tries to ignore her symptoms unless her heart beat is faster than  120 bpm. When her medicines are working, her resting heart rate is usually in the 60's.  She confirms her regimen includes propafenone, metoprolol, and diltiazem. Compliant with Eliquis, no bleeding issues.  They deny any shortness of breath, or peripheral edema. No lightheadedness, headaches, syncope, orthopnea, or PND.    Past Medical History:  Diagnosis Date   Atrial fibrillation ([place])    Back pain    Diabetes mellitus    Dysrhythmia    history Atrial Flutter. 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   Obesity    Restless legs    Sleep apnea    no cpap use now-unable to tolerate mask, surgery a yr ago   Vertigo     Past Surgical History:  Procedure Laterality Date   APPENDECTOMY     BUNIONECTOMY     ESOPHAGOGASTRODUODENOSCOPY (EGD) WITH PROPOFOL N/A 08/16/2015   Procedure: ESOPHAGOGASTRODUODENOSCOPY (EGD) WITH PROPOFOL;  Surgeon: Suzanne Fair, MD;  Location: [facility];  Service: Endoscopy;  Laterality: N/A;   GANGLION CYST   1980   REMOVED    HEMORROIDECTOMY     THYROIDECTOMY, PARTIAL  2005   "partial for growth-benign"   UPPER ENDOSCOPIC ULTRASOUND W/ FNA     UVULECTOMY  excised about a yr ago [facility]    Current Medications: Current Meds  Medication Sig   albuterol (VENTOLIN HFA) 108 (90 Base) MCG/ACT inhaler Inhale 2 puffs into the lungs every 6 (six) hours as needed for wheezing or shortness of breath.   atorvastatin (LIPITOR) 20 MG tablet Take 20 mg by mouth daily.   cyclobenzaprine (FLEXERIL) 10 MG tablet TAKE 1 TABLET BY MOUTH EVERYDAY AT BEDTIME. Call [phone] to schedule yearly follow up for ongoing refills (Patient taking differently: Take 10 mg by mouth at bedtime as needed for muscle spasms. Call  [phone] to schedule yearly follow up for ongoing refills)   diltiazem (CARDIZEM CD) 120 MG 24 hr capsule TAKE 1 CAPSULE BY MOUTH EVERY DAY   ELIQUIS 5 MG TABS tablet TAKE 1 TABLET BY MOUTH TWICE A DAY   fluticasone (FLONASE) 50 MCG/ACT nasal spray Place 2 sprays into both nostrils daily.   Magnesium 400 MG TABS Take 400 mg by mouth 2 (two) times daily.   Melatonin 10 MG TABS Take 10 mg by mouth at bedtime.    metFORMIN (GLUCOPHAGE-XR) 500 MG 24 hr tablet Take 1,000 mg by mouth 2 (two) times daily.    metoprolol succinate (TOPROL-XL) 50 MG 24 hr tablet Take 1 tablet (50 mg total) by mouth daily.   montelukast (SINGULAIR) 10 MG tablet Take 10 mg by mouth at bedtime.   omeprazole (PRILOSEC) 40 MG capsule Take 40 mg by mouth daily.   propafenone (RYTHMOL) 300 MG tablet TAKE 1 TABLET (300 MG TOTAL) BY MOUTH EVERY 8 (EIGHT) HOURS.   traMADol (ULTRAM) 50 MG tablet Take 50 mg by mouth 3 (three) times daily as needed (Pain).    VITAMIN D PO Take 1 tablet by mouth daily.     Allergies:   Doxycycline, Ozempic (0.25 or 0.5 mg-dose) [semaglutide(0.25 or 0.'5mg'$ -dos)], Yeast-related products, Molds & smuts, Penicillins, and Sulfa drugs cross reactors   Social History   Socioeconomic History   Marital status: Divorced    Spouse name: Not on file   Number of children: 1   Years of education: college   Highest education level: Bachelor's degree (e.g., BA, AB, BS)  Occupational History   Occupation: Disability  Tobacco Use   Smoking status: Never   Smokeless tobacco: Never  Substance and Sexual Activity   Alcohol use: No   Drug use: No   Sexual activity: Not on file  Other Topics Concern   Not on file  Social History Narrative   Lives at home alone.   Right-handed.   Occasional use of caffeine.   Social Determinants of Health   Financial Resource Strain: Not on file  Food Insecurity: No Food Insecurity (04/02/2020)   Hunger Vital Sign    Worried About Running Out of Food in the Last  Year: Never true    Ran Out of Food in the Last Year: Never true  Transportation Needs: Not on file  Physical Activity: Not on file  Stress: Not on file  Social Connections: Not on file     Family History: The patient's family history includes Cancer (age of onset: 26) in her mother; Diabetes in her father; Drug abuse in her brother; Heart disease in her mother; Hepatitis C in her brother.  ROS:   Please see the history of present illness.    (+) Fatigue (+) Chest tightening All other systems reviewed and are negative.  EKGs/Labs/Other Studies Reviewed:    The following studies were reviewed today:  07/30/2019  Echocardiogram:  1. Left ventricular ejection fraction by 3D volume is 57 %. The left  ventricle has normal function. The left ventrical has no regional wall  motion abnormalities. There is mildly increased concentric left  ventricular hypertrophy. Left ventricular  diastolic parameters are consistent with Grade I diastolic dysfunction  (impaired relaxation). The average left ventricular global longitudinal  strain is -18.5 %.   2. Right ventricular systolic function is mildly reduced. The right  ventricular size is normal. Tricuspid regurgitation signal is inadequate  for assessing PA pressure.   3. The mitral valve is normal in structure and function. trivial mitral  valve regurgitation. No evidence of mitral stenosis.   4. The aortic valve is normal in structure and function. Aortic valve  regurgitation is not visualized. No aortic stenosis is present.    EKG:   EKG is personally reviewed.     Recent Labs: 04/01/2021: ALT 13 04/04/2021: BUN 5; Creatinine, Ser 0.72; Hemoglobin 11.7; Magnesium 1.9; Platelets 325; Potassium 4.3; Sodium 137   Recent Lipid Panel No results found for: "CHOL", "TRIG", "HDL", "CHOLHDL", "VLDL", "LDLCALC", "LDLDIRECT"  Physical Exam:    VS:  BP 120/70   Pulse 76   Ht '5\' 5"'$  (1.651 Greer)   Wt 224 lb (101.6 kg)   BMI 37.28 kg/Greer      Wt Readings from Last 3 Encounters:  01/12/22 224 lb (101.6 kg)  11/23/21 217 lb (98.4 kg)  04/11/21 213 lb (96.6 kg)     GEN: Well nourished, well developed in no acute distress.  Obese HEENT: Normal NECK: No JVD; No carotid bruits LYMPHATICS: No lymphadenopathy CARDIAC: RRR, no murmurs, rubs, gallops RESPIRATORY:  Clear to auscultation without rales, wheezing or rhonchi  ABDOMEN: Soft, non-tender, non-distended MUSCULOSKELETAL:  No edema; No deformity  SKIN: Warm and dry NEUROLOGIC:  Alert and oriented x 3 PSYCHIATRIC:  Normal affect       ASSESSMENT:    1. Paroxysmal atrial fibrillation (HCC)   2. Typical atrial flutter ([place])   3. OSA (obstructive sleep apnea)    PLAN:    In order of problems listed above:  #Paroxysmal atrial fibrillation Continues to have symptomatic breakthrough episodes of atrial fibrillation despite treatment with propafenone, metoprolol and diltiazem.  Also experiencing fatigue secondary to these medications.  We discussed treatment options today including alternative antiarrhythmic drug and catheter ablation and she wishes to proceed with scheduling an ablation.  We also discussed watchman given her concerns about the cost and trouble associated with anticoagulation.  I do think she would be a candidate for both procedures.  She would like to proceed with scheduling the ablation will let us know how she would like to proceed regarding the watchman implant.  Discussed treatment options today for his AF including antiarrhythmic drug therapy and ablation. Discussed risks, recovery and likelihood of success. Discussed potential need for repeat ablation procedures and antiarrhythmic drugs after an initial ablation. They wish to proceed with scheduling.  Risk, benefits, and alternatives to EP study and radiofrequency ablation for afib were also discussed in detail today. These risks include but are not limited to stroke, bleeding, vascular damage,  tamponade, perforation, damage to the esophagus, lungs, and other structures, pulmonary vein stenosis, worsening renal function, and death. The patient understands these risk and wishes to proceed.  We will therefore proceed with catheter ablation at the next available time.  Carto, ICE, anesthesia are requested for the procedure.  Will also obtain CT PV protocol prior to the procedure to exclude LAA thrombus and  further evaluate atrial anatomy.  ---------------  I have seen Suzanne Greer in the office today who is being considered for a Watchman left atrial appendage closure device. I believe they will benefit from this procedure given their history of atrial fibrillation, CHA2DS2-VASc score of 5 and unadjusted ischemic stroke rate of 7.2% per year. The patient's chart has been reviewed and I feel that they would be a candidate for short term oral anticoagulation after Watchman implant.   It is my belief that after undergoing a LAA closure procedure, Suzanne Greer will not need long term anticoagulation which eliminates anticoagulation side effects and major bleeding risk.   Procedural risks for the Watchman implant have been reviewed with the patient including a 0.5% risk of stroke, <1% risk of perforation and <1% risk of device embolization. Other risks include bleeding, vascular damage, tamponade, worsening renal function, and death. The patient understands these risks. She will think about this and let us know if she would like to proceed.   The published clinical data on the safety and effectiveness of WATCHMAN include but are not limited to the following: - Holmes DR, Mechele Claude, Sick P et al. for the PROTECT AF Investigators. Percutaneous closure of the left atrial appendage versus warfarin therapy for prevention of stroke in patients with atrial fibrillation: a randomised non-inferiority trial. Lancet 2009; 374: 534-42. Mechele Claude, Doshi SK, Abelardo Diesel D et al. on behalf of the  PROTECT AF Investigators. Percutaneous Left Atrial Appendage Closure for Stroke Prophylaxis in Patients With Atrial Fibrillation 2.3-Year Follow-up of the PROTECT AF (Watchman Left Atrial Appendage System for Embolic Protection in Patients With Atrial Fibrillation) Trial. Circulation 2013; 127:720-729. - Alli O, Doshi S,  Kar S, Reddy VY, Sievert H et al. Quality of Life Assessment in the Randomized PROTECT AF (Percutaneous Closure of the Left Atrial Appendage Versus Warfarin Therapy for Prevention of Stroke in Patients With Atrial Fibrillation) Trial of Patients at Risk for Stroke With Nonvalvular Atrial Fibrillation. J Am Coll Cardiol 2013; 29:5621-3. Vertell Limber DR, Tarri Abernethy, Price Greer, [place], Sievert H, Doshi S, Huber K, Reddy V. Prospective randomized evaluation of the Watchman left atrial appendage Device in patients with atrial fibrillation versus long-term warfarin therapy; the PREVAIL trial. Journal of the [employer] of Cardiology, Vol. 4, No. 1, 2014, 1-11. - Kar S, Doshi SK, Sadhu A, Horton R, Osorio J et al. Primary outcome evaluation of a next-generation left atrial appendage closure device: results from the PINNACLE FLX trial. Circulation 2021;143(18)1754-1762.    HAS-BLED score 2 Hypertension Yes  Abnormal renal and liver function (Dialysis, transplant, Cr >2.26 mg/dL /Cirrhosis or Bilirubin >2x Normal or AST/ALT/AP >3x Normal) No  Stroke No  Bleeding No  Labile INR (Unstable/high INR) No  Elderly (>65) Yes  Drugs or alcohol (? 8 drinks/week, anti-plt or NSAID) No   CHA2DS2-VASc Score = 5  The patient's score is based upon: CHF History: 0 HTN History: 1 Diabetes History: 1 Stroke History: 0 Vascular Disease History: 1 Age Score: 1 Gender Score: 1     Total time spent with patient today 60 minutes. This includes reviewing records, evaluating the patient and coordinating care.  Medication Adjustments/Labs and Tests Ordered: Current medicines are reviewed at length with  the patient today.  Concerns regarding medicines are outlined above.  No orders of the defined types were placed in this encounter.  No orders of the defined types were placed in this encounter.   I,Mathew Stumpf,acting as a Education administrator  for Suzanne Epley, MD.,have documented all relevant documentation on the behalf of Suzanne Epley, MD,as directed by  Suzanne Epley, MD while in the presence of Suzanne Epley, MD.  I, Suzanne Epley, MD, have reviewed all documentation for this visit. The documentation on 01/12/22 for the exam, diagnosis, procedures, and orders are all accurate and complete.   Signed, Hilton Cork. Quentin Ore, MD, [facility], [facility] 01/12/2022 10:02 AM    Electrophysiology [facility]

## 2022-01-12 ENCOUNTER — Encounter: Payer: Self-pay | Admitting: Cardiology

## 2022-01-12 ENCOUNTER — Ambulatory Visit (INDEPENDENT_AMBULATORY_CARE_PROVIDER_SITE_OTHER): Payer: Medicare Other | Admitting: Cardiology

## 2022-01-12 VITALS — BP 120/70 | HR 76 | Ht 65.0 in | Wt 224.0 lb

## 2022-01-12 DIAGNOSIS — Z01818 Encounter for other preprocedural examination: Secondary | ICD-10-CM | POA: Diagnosis not present

## 2022-01-12 DIAGNOSIS — I483 Typical atrial flutter: Secondary | ICD-10-CM | POA: Diagnosis not present

## 2022-01-12 DIAGNOSIS — I480 Paroxysmal atrial fibrillation: Secondary | ICD-10-CM | POA: Diagnosis not present

## 2022-01-12 DIAGNOSIS — G4733 Obstructive sleep apnea (adult) (pediatric): Secondary | ICD-10-CM

## 2022-01-12 NOTE — Patient Instructions (Addendum)
Medication Instructions:  Your physician recommends that you continue on your current medications as directed. Please refer to the Current Medication list given to you today. *If you need a refill on your cardiac medications before your next appointment, please call your pharmacy*  Lab Work: None. If you have labs (blood work) drawn today and your tests are completely normal, you will receive your results only by: [place] (if you have MyChart) OR A paper copy in the mail If you have any lab test that is abnormal or we need to change your treatment, we will call you to review the results.  Testing/Procedures: Your physician has requested that you have cardiac CT. Cardiac computed tomography (CT) is a painless test that uses an x-ray machine to take clear, detailed pictures of your heart. For further information please visit [email or web address]. Please follow instruction sheet as given.  Your physician has recommended that you have an ablation. Catheter ablation is a medical procedure used to treat some cardiac arrhythmias (irregular heartbeats). During catheter ablation, a long, thin, flexible tube is put into a blood vessel in your groin (upper thigh), or neck. This tube is called an ablation catheter. It is then guided to your heart through the blood vessel. Radio frequency waves destroy small areas of heart tissue where abnormal heartbeats may cause an arrhythmia to start. Please see the instruction sheet given to you today.   Follow-Up: At [facility], you and your health needs are our priority.  As part of our continuing mission to provide you with exceptional heart care, we have created designated Provider Care Teams.  These Care Teams include your primary Cardiologist (physician) and Advanced Practice Providers (APPs -  Physician Assistants and Nurse Practitioners) who all work together to provide you with the care you need, when you need it.  Your physician wants you to  follow-up in: Ablation date pick Dec 12, pre op lab date pick Nov 21- Anytime from 8 am to 4 pm; no fasting. Otila Kluver RN will call with instructions for CT and Ablation.   We recommend signing up for the patient portal called "MyChart".  Sign up information is provided on this After Visit Summary.  MyChart is used to connect with patients for Virtual Visits (Telemedicine).  Patients are able to view lab/test results, encounter notes, upcoming appointments, etc.  Non-urgent messages can be sent to your provider as well.   To learn more about what you can do with MyChart, go to [email or web address].    Any Other Special Instructions Will Be Listed Below (If Applicable).  Cardiac Ablation Cardiac ablation is a procedure to destroy (ablate) some heart tissue that is sending bad signals. These bad signals cause problems in heart rhythm. The heart has many areas that make these signals. If there are problems in these areas, they can make the heart beat in a way that is not normal. Destroying some tissues can help make the heart rhythm normal. Tell your doctor about: Any allergies you have. All medicines you are taking. These include vitamins, herbs, eye drops, creams, and over-the-counter medicines. Any problems you or family members have had with medicines that make you fall asleep (anesthetics). Any blood disorders you have. Any surgeries you have had. Any medical conditions you have, such as kidney failure. Whether you are pregnant or may be pregnant. What are the risks? This is a safe procedure. But problems may occur, including: Infection. Bruising and bleeding. Bleeding into the chest. Stroke or blood clots. Damage to nearby areas  of your body. Allergies to medicines or dyes. The need for a pacemaker if the normal system is damaged. Failure of the procedure to treat the problem. What happens before the procedure? Medicines Ask your doctor about: Changing or stopping your normal  medicines. This is important. Taking aspirin and ibuprofen. Do not take these medicines unless your doctor tells you to take them. Taking other medicines, vitamins, herbs, and supplements. General instructions Follow instructions from your doctor about what you cannot eat or drink. Plan to have someone take you home from the hospital or clinic. If you will be going home right after the procedure, plan to have someone with you for 24 hours. Ask your doctor what steps will be taken to prevent infection. What happens during the procedure?  An IV tube will be put into one of your veins. You will be given a medicine to help you relax. The skin on your neck or groin will be numbed. A cut (incision) will be made in your neck or groin. A needle will be put through your cut and into a large vein. A tube (catheter) will be put into the needle. The tube will be moved to your heart. Dye may be put through the tube. This helps your doctor see your heart. Small devices (electrodes) on the tube will send out signals. A type of energy will be used to destroy some heart tissue. The tube will be taken out. Pressure will be held on your cut. This helps stop bleeding. A bandage will be put over your cut. The exact procedure may vary among doctors and hospitals. What happens after the procedure? You will be watched until you leave the hospital or clinic. This includes checking your heart rate, breathing rate, oxygen, and blood pressure. Your cut will be watched for bleeding. You will need to lie still for a few hours. Do not drive for 24 hours or as long as your doctor tells you. Summary Cardiac ablation is a procedure to destroy some heart tissue. This is done to treat heart rhythm problems. Tell your doctor about any medical conditions you may have. Tell him or her about all medicines you are taking to treat them. This is a safe procedure. But problems may occur. These include infection, bruising,  bleeding, and damage to nearby areas of your body. Follow what your doctor tells you about food and drink. You may also be told to change or stop some of your medicines. After the procedure, do not drive for 24 hours or as long as your doctor tells you. This information is not intended to replace advice given to you by your health care provider. Make sure you discuss any questions you have with your health care provider. Document Revised: 05/08/2019 Document Reviewed: 05/08/2019 Elsevier Patient Education  [place].

## 2022-01-23 DIAGNOSIS — B351 Tinea unguium: Secondary | ICD-10-CM | POA: Diagnosis not present

## 2022-01-23 DIAGNOSIS — M79672 Pain in left foot: Secondary | ICD-10-CM | POA: Diagnosis not present

## 2022-01-23 DIAGNOSIS — M79671 Pain in right foot: Secondary | ICD-10-CM | POA: Diagnosis not present

## 2022-01-23 DIAGNOSIS — L89892 Pressure ulcer of other site, stage 2: Secondary | ICD-10-CM | POA: Diagnosis not present

## 2022-01-24 DIAGNOSIS — R87610 Atypical squamous cells of undetermined significance on cytologic smear of cervix (ASC-US): Secondary | ICD-10-CM | POA: Diagnosis not present

## 2022-01-24 DIAGNOSIS — Z124 Encounter for screening for malignant neoplasm of cervix: Secondary | ICD-10-CM | POA: Diagnosis not present

## 2022-01-24 DIAGNOSIS — N870 Mild cervical dysplasia: Secondary | ICD-10-CM | POA: Diagnosis not present

## 2022-01-31 DIAGNOSIS — M25511 Pain in right shoulder: Secondary | ICD-10-CM | POA: Diagnosis not present

## 2022-01-31 DIAGNOSIS — M7541 Impingement syndrome of right shoulder: Secondary | ICD-10-CM | POA: Diagnosis not present

## 2022-02-06 DIAGNOSIS — E1159 Type 2 diabetes mellitus with other circulatory complications: Secondary | ICD-10-CM | POA: Diagnosis not present

## 2022-02-06 DIAGNOSIS — M797 Fibromyalgia: Secondary | ICD-10-CM | POA: Diagnosis not present

## 2022-02-06 DIAGNOSIS — G8929 Other chronic pain: Secondary | ICD-10-CM | POA: Diagnosis not present

## 2022-02-06 DIAGNOSIS — J45909 Unspecified asthma, uncomplicated: Secondary | ICD-10-CM | POA: Diagnosis not present

## 2022-02-06 DIAGNOSIS — Z7984 Long term (current) use of oral hypoglycemic drugs: Secondary | ICD-10-CM | POA: Diagnosis not present

## 2022-02-06 DIAGNOSIS — Z79899 Other long term (current) drug therapy: Secondary | ICD-10-CM | POA: Diagnosis not present

## 2022-02-06 DIAGNOSIS — I700 Atherosclerosis of aorta: Secondary | ICD-10-CM | POA: Diagnosis not present

## 2022-02-06 DIAGNOSIS — Z6839 Body mass index (BMI) 39.0-39.9, adult: Secondary | ICD-10-CM | POA: Diagnosis not present

## 2022-02-06 DIAGNOSIS — E612 Magnesium deficiency: Secondary | ICD-10-CM | POA: Diagnosis not present

## 2022-02-06 DIAGNOSIS — E7800 Pure hypercholesterolemia, unspecified: Secondary | ICD-10-CM | POA: Diagnosis not present

## 2022-02-06 DIAGNOSIS — M545 Low back pain, unspecified: Secondary | ICD-10-CM | POA: Diagnosis not present

## 2022-02-15 DIAGNOSIS — M25511 Pain in right shoulder: Secondary | ICD-10-CM | POA: Diagnosis not present

## 2022-02-22 DIAGNOSIS — M25511 Pain in right shoulder: Secondary | ICD-10-CM | POA: Diagnosis not present

## 2022-02-24 DIAGNOSIS — M25511 Pain in right shoulder: Secondary | ICD-10-CM | POA: Diagnosis not present

## 2022-02-28 DIAGNOSIS — M25511 Pain in right shoulder: Secondary | ICD-10-CM | POA: Diagnosis not present

## 2022-03-02 DIAGNOSIS — Z1331 Encounter for screening for depression: Secondary | ICD-10-CM | POA: Diagnosis not present

## 2022-03-02 DIAGNOSIS — E7800 Pure hypercholesterolemia, unspecified: Secondary | ICD-10-CM | POA: Diagnosis not present

## 2022-03-02 DIAGNOSIS — K2100 Gastro-esophageal reflux disease with esophagitis, without bleeding: Secondary | ICD-10-CM | POA: Diagnosis not present

## 2022-03-02 DIAGNOSIS — E669 Obesity, unspecified: Secondary | ICD-10-CM | POA: Diagnosis not present

## 2022-03-02 DIAGNOSIS — G4733 Obstructive sleep apnea (adult) (pediatric): Secondary | ICD-10-CM | POA: Diagnosis not present

## 2022-03-02 DIAGNOSIS — Z6837 Body mass index (BMI) 37.0-37.9, adult: Secondary | ICD-10-CM | POA: Diagnosis not present

## 2022-03-02 DIAGNOSIS — E1149 Type 2 diabetes mellitus with other diabetic neurological complication: Secondary | ICD-10-CM | POA: Diagnosis not present

## 2022-03-03 DIAGNOSIS — M25511 Pain in right shoulder: Secondary | ICD-10-CM | POA: Diagnosis not present

## 2022-03-07 DIAGNOSIS — M25511 Pain in right shoulder: Secondary | ICD-10-CM | POA: Diagnosis not present

## 2022-03-10 DIAGNOSIS — M25511 Pain in right shoulder: Secondary | ICD-10-CM | POA: Diagnosis not present

## 2022-03-13 DIAGNOSIS — M25511 Pain in right shoulder: Secondary | ICD-10-CM | POA: Diagnosis not present

## 2022-03-15 DIAGNOSIS — M7541 Impingement syndrome of right shoulder: Secondary | ICD-10-CM | POA: Diagnosis not present

## 2022-03-20 DIAGNOSIS — E1149 Type 2 diabetes mellitus with other diabetic neurological complication: Secondary | ICD-10-CM | POA: Diagnosis not present

## 2022-03-20 DIAGNOSIS — Z6838 Body mass index (BMI) 38.0-38.9, adult: Secondary | ICD-10-CM | POA: Diagnosis not present

## 2022-03-20 DIAGNOSIS — G4733 Obstructive sleep apnea (adult) (pediatric): Secondary | ICD-10-CM | POA: Diagnosis not present

## 2022-03-20 DIAGNOSIS — E669 Obesity, unspecified: Secondary | ICD-10-CM | POA: Diagnosis not present

## 2022-03-21 DIAGNOSIS — R87810 Cervical high risk human papillomavirus (HPV) DNA test positive: Secondary | ICD-10-CM | POA: Diagnosis not present

## 2022-03-21 DIAGNOSIS — R87610 Atypical squamous cells of undetermined significance on cytologic smear of cervix (ASC-US): Secondary | ICD-10-CM | POA: Diagnosis not present

## 2022-03-27 DIAGNOSIS — L84 Corns and callosities: Secondary | ICD-10-CM | POA: Diagnosis not present

## 2022-03-27 DIAGNOSIS — E1159 Type 2 diabetes mellitus with other circulatory complications: Secondary | ICD-10-CM | POA: Diagnosis not present

## 2022-03-27 DIAGNOSIS — B351 Tinea unguium: Secondary | ICD-10-CM | POA: Diagnosis not present

## 2022-04-18 DIAGNOSIS — Z6839 Body mass index (BMI) 39.0-39.9, adult: Secondary | ICD-10-CM | POA: Diagnosis not present

## 2022-04-18 DIAGNOSIS — G2581 Restless legs syndrome: Secondary | ICD-10-CM | POA: Diagnosis not present

## 2022-04-18 DIAGNOSIS — Z7984 Long term (current) use of oral hypoglycemic drugs: Secondary | ICD-10-CM | POA: Diagnosis not present

## 2022-04-18 DIAGNOSIS — Z79899 Other long term (current) drug therapy: Secondary | ICD-10-CM | POA: Diagnosis not present

## 2022-04-18 DIAGNOSIS — E1140 Type 2 diabetes mellitus with diabetic neuropathy, unspecified: Secondary | ICD-10-CM | POA: Diagnosis not present

## 2022-04-18 DIAGNOSIS — E559 Vitamin D deficiency, unspecified: Secondary | ICD-10-CM | POA: Diagnosis not present

## 2022-04-18 DIAGNOSIS — M797 Fibromyalgia: Secondary | ICD-10-CM | POA: Diagnosis not present

## 2022-04-18 DIAGNOSIS — I4891 Unspecified atrial fibrillation: Secondary | ICD-10-CM | POA: Diagnosis not present

## 2022-04-18 DIAGNOSIS — H8101 Meniere's disease, right ear: Secondary | ICD-10-CM | POA: Diagnosis not present

## 2022-04-18 DIAGNOSIS — K2100 Gastro-esophageal reflux disease with esophagitis, without bleeding: Secondary | ICD-10-CM | POA: Diagnosis not present

## 2022-04-18 DIAGNOSIS — Z780 Asymptomatic menopausal state: Secondary | ICD-10-CM | POA: Diagnosis not present

## 2022-05-09 ENCOUNTER — Ambulatory Visit: Payer: Federal, State, Local not specified - PPO | Attending: Cardiology

## 2022-05-09 DIAGNOSIS — G4733 Obstructive sleep apnea (adult) (pediatric): Secondary | ICD-10-CM

## 2022-05-09 DIAGNOSIS — I483 Typical atrial flutter: Secondary | ICD-10-CM

## 2022-05-09 DIAGNOSIS — I480 Paroxysmal atrial fibrillation: Secondary | ICD-10-CM | POA: Diagnosis not present

## 2022-05-09 DIAGNOSIS — Z01818 Encounter for other preprocedural examination: Secondary | ICD-10-CM | POA: Diagnosis not present

## 2022-05-09 LAB — CBC WITH DIFFERENTIAL/PLATELET
Basophils Absolute: 0 10*3/uL (ref 0.0–0.2)
Basos: 0 %
EOS (ABSOLUTE): 0.3 10*3/uL (ref 0.0–0.4)
Eos: 3 %
Hematocrit: 38.6 % (ref 34.0–46.6)
Hemoglobin: 12.5 g/dL (ref 11.1–15.9)
Immature Grans (Abs): 0.1 10*3/uL (ref 0.0–0.1)
Immature Granulocytes: 1 %
Lymphocytes Absolute: 3.4 10*3/uL — ABNORMAL HIGH (ref 0.7–3.1)
Lymphs: 33 %
MCH: 26.7 pg (ref 26.6–33.0)
MCHC: 32.4 g/dL (ref 31.5–35.7)
MCV: 83 fL (ref 79–97)
Monocytes Absolute: 0.7 10*3/uL (ref 0.1–0.9)
Monocytes: 6 %
Neutrophils Absolute: 5.9 10*3/uL (ref 1.4–7.0)
Neutrophils: 57 %
Platelets: 358 10*3/uL (ref 150–450)
RBC: 4.68 x10E6/uL (ref 3.77–5.28)
RDW: 12.4 % (ref 11.7–15.4)
WBC: 10.4 10*3/uL (ref 3.4–10.8)

## 2022-05-09 LAB — BASIC METABOLIC PANEL
BUN/Creatinine Ratio: 14 (ref 12–28)
BUN: 11 mg/dL (ref 8–27)
CO2: 26 mmol/L (ref 20–29)
Calcium: 9 mg/dL (ref 8.7–10.3)
Chloride: 100 mmol/L (ref 96–106)
Creatinine, Ser: 0.77 mg/dL (ref 0.57–1.00)
Glucose: 163 mg/dL — ABNORMAL HIGH (ref 70–99)
Potassium: 4.4 mmol/L (ref 3.5–5.2)
Sodium: 140 mmol/L (ref 134–144)
eGFR: 83 mL/min/{1.73_m2} (ref >59)

## 2022-05-18 NOTE — Progress Notes (Deleted)
Cardiology Office Note   Date:  11/23/2021   ID:  Suzanne Greer, Suzanne Greer Dec 19, 1950, MRN [id number]  PCP:  Vernie Shanks, MD  Cardiologist:   Jenevie Casstevens Martin[place], MD   Chief Complaint  Patient presents with   Follow-up    1 year.   Atrial Flutter   Atrial Fibrillation     History of Present Illness: Suzanne Greer is a 70 y.o. female who is seen for follow up Atrial fibrillation/flutter. She has a history of HTN, OSA, and DM.  Has been in Rhythmol and Eliquis for years. She had a remote Echo in 2011 that showed Mild LVH otherwise normal. She reports a Myoview study years ago at [facility] that apparently was normal. She is on CPAP therapy. She reports that in December she was having increased symptoms with Afib and was placed on diltiazem in addition to propafenone and metoprolol. She states she was having episodes at least 2 days a week. When her HR is over 100 she feels palpitations. When HR is up to 150 she feels extremely tired and has chest tightness and pressure like something sitting on her chest. She does have chronic dizziness but attributes this to Meniere's disease. She does not drink caffeine.  Echo in February 2021 was normal.  She was admitted to hospital service in October 2022 secondary to nausea vomiting and actually had 1 episode of witnessed seizure in the ED as well. Initially at [facility] she had some Afib with RVR.  She was found to have significantly low magnesium.  Seizure was thought to be secondary to severe hypomagnesemia. N/V felt to be related to Ozempic.   On follow in June she reported increased breakthrough Afib on propafenone. Also had difficulty affording Eliquis. Was seen by Dr Quentin Ore with consideration for Afib ablation +/- Watchman device.    Past Medical History:  Diagnosis Date   Atrial fibrillation ([place])    Back pain    Diabetes mellitus    Dysrhythmia    history Atrial Flutter. 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   Obesity    Restless legs    Sleep apnea    no cpap use now-unable to tolerate mask, surgery a yr ago   Vertigo     Past Surgical History:  Procedure Laterality Date   APPENDECTOMY     BUNIONECTOMY     ESOPHAGOGASTRODUODENOSCOPY (EGD) WITH PROPOFOL N/A 08/16/2015   Procedure: ESOPHAGOGASTRODUODENOSCOPY (EGD) WITH PROPOFOL;  Surgeon: Garlan Fair, MD;  Location: [facility];  Service: Endoscopy;  Laterality: N/A;   GANGLION CYST   1980   REMOVED    HEMORROIDECTOMY     THYROIDECTOMY, PARTIAL  2005   "partial for growth-benign"   UPPER ENDOSCOPIC ULTRASOUND W/ FNA     UVULECTOMY     exc[facility] about a yr ago [facility]     Current Outpatient Medications  Medication Sig Dispense Refill   albuterol (VENTOLIN HFA) 108 (90 Base) MCG/ACT inhaler Inhale 2 puffs into the lungs every 6 (six) hours as needed for wheezing or shortness of breath.     atorvastatin (LIPITOR) 20 MG tablet Take 20 mg by mouth daily.     cyclobenzaprine (FLEXERIL)  10 MG tablet TAKE 1 TABLET BY MOUTH EVERYDAY AT BEDTIME. Call [phone] to schedule yearly follow up for ongoing refills (Patient taking differently: Take 10 mg by mouth at bedtime as needed for muscle spasms. Call [phone] to schedule yearly follow up for ongoing refills) 15 tablet 0   diltiazem (CARDIZEM CD) 120 MG 24 hr capsule TAKE 1 CAPSULE BY MOUTH EVERY DAY 90 capsule 3   ELIQUIS 5 MG TABS tablet TAKE 1 TABLET BY MOUTH TWICE A DAY 180 tablet 3   fluticasone (FLONASE) 50 MCG/ACT nasal spray Place 2 sprays into both nostrils daily.     Magnesium 400 MG TABS Take 400 mg by mouth daily.      Melatonin 10 MG TABS Take 10 mg by mouth at bedtime.      metFORMIN (GLUCOPHAGE-XR) 500 MG 24 hr tablet Take 1,000 mg by mouth  2 (two) times daily.      metoprolol succinate (TOPROL-XL) 50 MG 24 hr tablet Take 1 tablet (50 mg total) by mouth daily. 90 tablet 3   montelukast (SINGULAIR) 10 MG tablet Take 10 mg by mouth at bedtime.     omeprazole (PRILOSEC) 40 MG capsule Take 40 mg by mouth daily.     propafenone (RYTHMOL) 300 MG tablet TAKE 1 TABLET (300 MG TOTAL) BY MOUTH EVERY 8 (EIGHT) HOURS. 270 tablet 3   traMADol (ULTRAM) 50 MG tablet Take 50 mg by mouth 3 (three) times daily as needed (Pain).      VITAMIN D PO Take 1 tablet by mouth daily.     No current facility-administered medications for this visit.    Allergies:   Doxycycline, Ozempic (0.25 or 0.5 mg-dose) [semaglutide(0.25 or 0.'5mg'$ -dos)], Yeast-related products, Molds & smuts, Penicillins, and Sulfa drugs cross reactors    Social History:  The patient  reports that she has never smoked. She has never used smokeless tobacco. She reports that she does not drink alcohol and does not use drugs.   Family History:  The patient's family history includes Cancer (age of onset: 47) in her mother; Diabetes in her father; Drug abuse in her brother; Heart disease in her mother; Hepatitis C in her brother.    ROS:  Please see the history of present illness.   Otherwise, review of systems are positive for none.   All other systems are reviewed and negative.    PHYSICAL EXAM: VS:  BP 116/60 (BP Location: Left Arm, Patient Position: Sitting, Cuff Size: Large)   Pulse 96   Ht '5\' 5"'$  (1.651 m)   Wt 217 lb (98.4 kg)   BMI 36.11 kg/m  , BMI Body mass index is 36.11 kg/m. GEN: Well nourished, overweight, in no acute distress  HEENT: normal  Neck: no JVD, carotid bruits, or masses Cardiac: IRRR; no murmurs, rubs, or gallops,no edema  Respiratory:  clear to auscultation bilaterally, normal work of breathing GI: soft, nontender, nondistended, + BS MS: no deformity or atrophy  Skin: warm and dry, no rash Neuro:  Strength and sensation are intact Psych: euthymic mood,  full affect   EKG:  EKG is  ordered today. Atrial flutter with variable AV conduction. Rate 96. Low voltage. I have personally reviewed and interpreted this study.     Recent Labs: 04/01/2021: ALT 13 04/04/2021: BUN 5; Creatinine, Ser 0.72; Hemoglobin 11.7; Magnesium 1.9; Platelets 325; Potassium 4.3; Sodium 137    Lipid Panel No results found for: CHOL, TRIG, HDL, CHOLHDL, VLDL, LDLCALC, LDLDIRECT   Dated 09/25/18: A1c 7.6%. cholesterol 216,  triglycerides 134, HDL 56, LDL 133. CMET and TSH normal. Dated 10/21/19: A1c 7.2%. cholesterol 157, triglycerides 185, HDL 54, LDL 72. Glucose 160 otherwise CMET normal.  Dated 04/23/20: A1c 7%. Cholesterol 200, triglycerides 161, HDL 55, LDL 117. Glucose 154 otherwise CMET and TSH normal.  Dated 03/23/21: cholesterol 150, triglycerides 157, HDL 48, LDL 84. A1c 7.2%.  Dated 3/27//23: cholesterol 149, triglycerides 116, HDL 61, LDL 68 Dated 10/04/21: A1c 6.9%. CMET normal. Dated 04/18/22: A1c 7.3%. normal CMET  Wt Readings from Last 3 Encounters:  11/23/21 217 lb (98.4 kg)  04/11/21 213 lb (96.6 kg)  04/04/21 213 lb 6.5 oz (96.8 kg)      Other studies Reviewed: Additional studies/ records that were reviewed today include:   Echo 07/30/19: IMPRESSIONS     1. Left ventricular ejection fraction by 3D volume is 57 %. The left  ventricle has normal function. The left ventrical has no regional wall  motion abnormalities. There is mildly increased concentric left  ventricular hypertrophy. Left ventricular  diastolic parameters are consistent with Grade I diastolic dysfunction  (impaired relaxation). The average left ventricular global longitudinal  strain is -18.5 %.   2. Right ventricular systolic function is mildly reduced. The right  ventricular size is normal. Tricuspid regurgitation signal is inadequate  for assessing PA pressure.   3. The mitral valve is normal in structure and function. trivial mitral  valve regurgitation. No evidence of  mitral stenosis.   4. The aortic valve is normal in structure and function. Aortic valve  regurgitation is not visualized. No aortic stenosis is present.   ASSESSMENT AND PLAN:  1.  Paroxysmal Atrial fibrillation/ Flutter Patient on long term AAD therapy with propafenone. On Diltiazem and Toprol for rate control.   She has generally been doing OK on twice a day dosing. It appears she is having increased break through based on her HR monitor. She is going to try and   increase propafenone to tid. She has a [place] Vasc score of  3-4 so is on Eliquis now. Will arrange follow up with Pharm D since she may need to switch to Coumadin for cost. I would like for her to see EP for consideration of ablation.  2. DM type 2. Per PCP 3. HLD. On lipitor 4. OSA on CPAP. Stressed importance of keeping weight under control.   Current medicines are reviewed at length with the patient today.  The patient does not have concerns regarding medicines.  The following changes have been made:  increase propafenone to tid. Follow up with Pharm D for anticoagulation issues.   Labs/ tests ordered today include:   Orders Placed This Encounter  Procedures   Ambulatory referral to Cardiac Electrophysiology   EKG 12-Lead      Disposition:   FU with me in 6 months.   Signed, Latarshia Jersey Martinique, MD  11/23/2021 9:20 AM    [facility] [address] [place], [place], [address] Phone [phone], Fax [phone]

## 2022-05-22 ENCOUNTER — Ambulatory Visit: Payer: Medicare Other | Admitting: Cardiology

## 2022-05-22 ENCOUNTER — Telehealth (HOSPITAL_COMMUNITY): Payer: Self-pay | Admitting: *Deleted

## 2022-05-22 NOTE — Telephone Encounter (Signed)
Reaching out to patient to offer assistance regarding upcoming cardiac imaging study; pt verbalizes understanding of appt date/time, parking situation and where to check in, and verified current allergies; name and call back number provided for further questions should they arise  Saia Derossett RN Navigator Cardiac Imaging [facility] [phone] office [phone] cell  

## 2022-05-23 ENCOUNTER — Ambulatory Visit (HOSPITAL_BASED_OUTPATIENT_CLINIC_OR_DEPARTMENT_OTHER)
Admission: RE | Admit: 2022-05-23 | Discharge: 2022-05-23 | Disposition: A | Discharge status: Home / Self Care | Payer: Medicare Other | Source: Ambulatory Visit | Attending: Cardiology | Admitting: Cardiology

## 2022-05-23 ENCOUNTER — Encounter (HOSPITAL_BASED_OUTPATIENT_CLINIC_OR_DEPARTMENT_OTHER): Payer: Self-pay

## 2022-05-23 DIAGNOSIS — I480 Paroxysmal atrial fibrillation: Secondary | ICD-10-CM | POA: Diagnosis not present

## 2022-05-23 DIAGNOSIS — Z01818 Encounter for other preprocedural examination: Secondary | ICD-10-CM | POA: Diagnosis not present

## 2022-05-23 DIAGNOSIS — G4733 Obstructive sleep apnea (adult) (pediatric): Secondary | ICD-10-CM | POA: Insufficient documentation

## 2022-05-23 DIAGNOSIS — I483 Typical atrial flutter: Secondary | ICD-10-CM | POA: Diagnosis not present

## 2022-05-23 MED ORDER — IOHEXOL 350 MG/ML SOLN
100.0000 mL | Freq: Once | INTRAVENOUS | Status: AC | PRN
  Administered 2022-05-23: 80 mL via INTRAVENOUS

## 2022-05-29 NOTE — Pre-Procedure Instructions (Signed)
Instructed patient on the following items: Arrival time 0530 Nothing to eat or drink after midnight No meds AM of procedure Responsible person to drive you home and stay with you for 24 hrs  Have you missed any doses of anti-coagulant Elqiuis- has not missed any doses

## 2022-05-30 ENCOUNTER — Ambulatory Visit (HOSPITAL_COMMUNITY): Payer: Medicare Other | Admitting: Anesthesiology

## 2022-05-30 ENCOUNTER — Other Ambulatory Visit: Payer: Self-pay

## 2022-05-30 ENCOUNTER — Ambulatory Visit (HOSPITAL_COMMUNITY)
Admission: RE | Admit: 2022-05-30 | Discharge: 2022-05-30 | Disposition: A | Discharge status: Home / Self Care | Payer: Medicare Other | Attending: Cardiology | Admitting: Cardiology

## 2022-05-30 ENCOUNTER — Other Ambulatory Visit (HOSPITAL_COMMUNITY): Payer: Self-pay

## 2022-05-30 ENCOUNTER — Encounter (HOSPITAL_COMMUNITY)
Admission: RE | Disposition: A | Discharge status: Home / Self Care | Payer: Self-pay | Source: Home / Self Care | Attending: Cardiology

## 2022-05-30 ENCOUNTER — Encounter (HOSPITAL_COMMUNITY): Payer: Self-pay | Admitting: Cardiology

## 2022-05-30 DIAGNOSIS — I4891 Unspecified atrial fibrillation: Secondary | ICD-10-CM | POA: Diagnosis not present

## 2022-05-30 DIAGNOSIS — I480 Paroxysmal atrial fibrillation: Secondary | ICD-10-CM | POA: Diagnosis not present

## 2022-05-30 DIAGNOSIS — Z7901 Long term (current) use of anticoagulants: Secondary | ICD-10-CM | POA: Diagnosis not present

## 2022-05-30 DIAGNOSIS — K219 Gastro-esophageal reflux disease without esophagitis: Secondary | ICD-10-CM | POA: Diagnosis not present

## 2022-05-30 DIAGNOSIS — Z7984 Long term (current) use of oral hypoglycemic drugs: Secondary | ICD-10-CM | POA: Insufficient documentation

## 2022-05-30 DIAGNOSIS — G4733 Obstructive sleep apnea (adult) (pediatric): Secondary | ICD-10-CM | POA: Insufficient documentation

## 2022-05-30 DIAGNOSIS — I483 Typical atrial flutter: Secondary | ICD-10-CM | POA: Insufficient documentation

## 2022-05-30 DIAGNOSIS — I10 Essential (primary) hypertension: Secondary | ICD-10-CM | POA: Diagnosis not present

## 2022-05-30 DIAGNOSIS — Z8249 Family history of ischemic heart disease and other diseases of the circulatory system: Secondary | ICD-10-CM | POA: Diagnosis not present

## 2022-05-30 DIAGNOSIS — E119 Type 2 diabetes mellitus without complications: Secondary | ICD-10-CM | POA: Insufficient documentation

## 2022-05-30 HISTORY — PX: ATRIAL FIBRILLATION ABLATION: EP1191

## 2022-05-30 LAB — GLUCOSE, CAPILLARY
Glucose-Capillary: 206 mg/dL — ABNORMAL HIGH (ref 70–99)
Glucose-Capillary: 196 mg/dL — ABNORMAL HIGH (ref 70–99)
Glucose-Capillary: 243 mg/dL — ABNORMAL HIGH (ref 70–99)

## 2022-05-30 LAB — POCT ACTIVATED CLOTTING TIME
Activated Clotting Time: 423 seconds
Activated Clotting Time: 450 seconds

## 2022-05-30 SURGERY — ATRIAL FIBRILLATION ABLATION
Anesthesia: General

## 2022-05-30 MED ORDER — HEPARIN SODIUM (PORCINE) 1000 UNIT/ML IJ SOLN
INTRAMUSCULAR | Status: DC | PRN
  Administered 2022-05-30: 1000 [IU] via INTRAVENOUS

## 2022-05-30 MED ORDER — FENTANYL CITRATE (PF) 100 MCG/2ML IJ SOLN
INTRAMUSCULAR | Status: DC | PRN
  Administered 2022-05-30: 100 ug via INTRAVENOUS

## 2022-05-30 MED ORDER — PANTOPRAZOLE SODIUM 40 MG PO TBEC
40.0000 mg | DELAYED_RELEASE_TABLET | Freq: Every day | ORAL | Status: DC
  Administered 2022-05-30: 40 mg via ORAL
  Filled 2022-05-30: qty 1

## 2022-05-30 MED ORDER — PROPAFENONE HCL 300 MG PO TABS: 300.0000 mg | ORAL_TABLET | ORAL | Status: DC

## 2022-05-30 MED ORDER — SODIUM CHLORIDE 0.9% FLUSH: 3.0000 mL | Freq: Two times a day (BID) | INTRAVENOUS | Status: DC

## 2022-05-30 MED ORDER — PROPOFOL 10 MG/ML IV BOLUS
INTRAVENOUS | Status: DC | PRN
  Administered 2022-05-30: 30 mg via INTRAVENOUS
  Administered 2022-05-30: 140 mg via INTRAVENOUS
  Administered 2022-05-30: 30 mg via INTRAVENOUS

## 2022-05-30 MED ORDER — SUGAMMADEX SODIUM 200 MG/2ML IV SOLN
INTRAVENOUS | Status: DC | PRN
  Administered 2022-05-30: 400 mg via INTRAVENOUS

## 2022-05-30 MED ORDER — SODIUM CHLORIDE 0.9% FLUSH: 3.0000 mL | INTRAVENOUS | Status: DC | PRN

## 2022-05-30 MED ORDER — COLCHICINE 0.6 MG PO TABS
0.6000 mg | ORAL_TABLET | Freq: Two times a day (BID) | ORAL | Status: DC
  Administered 2022-05-30: 0.6 mg via ORAL
  Filled 2022-05-30: qty 1

## 2022-05-30 MED ORDER — DEXAMETHASONE SODIUM PHOSPHATE 10 MG/ML IJ SOLN
INTRAMUSCULAR | Status: DC | PRN
  Administered 2022-05-30: 10 mg via INTRAVENOUS

## 2022-05-30 MED ORDER — HEPARIN SODIUM (PORCINE) 1000 UNIT/ML IJ SOLN
INTRAMUSCULAR | Status: DC | PRN
  Administered 2022-05-30: 16000 [IU] via INTRAVENOUS

## 2022-05-30 MED ORDER — PROPOFOL 500 MG/50ML IV EMUL
INTRAVENOUS | Status: DC | PRN
  Administered 2022-05-30 (×2): 125 ug/kg/min via INTRAVENOUS

## 2022-05-30 MED ORDER — ROCURONIUM BROMIDE 100 MG/10ML IV SOLN
INTRAVENOUS | Status: DC | PRN
  Administered 2022-05-30: 70 mg via INTRAVENOUS
  Administered 2022-05-30 (×2): 30 mg via INTRAVENOUS
  Administered 2022-05-30: 20 mg via INTRAVENOUS

## 2022-05-30 MED ORDER — PROTAMINE SULFATE 10 MG/ML IV SOLN
INTRAVENOUS | Status: DC | PRN
  Administered 2022-05-30: 30 mg via INTRAVENOUS

## 2022-05-30 MED ORDER — MIDAZOLAM HCL 2 MG/2ML IJ SOLN
INTRAMUSCULAR | Status: DC | PRN
  Administered 2022-05-30: 2 mg via INTRAVENOUS

## 2022-05-30 MED ORDER — ONDANSETRON HCL 4 MG/2ML IJ SOLN: 4.0000 mg | Freq: Four times a day (QID) | INTRAMUSCULAR | Status: DC | PRN

## 2022-05-30 MED ORDER — PROPAFENONE HCL 150 MG PO TABS
300.0000 mg | ORAL_TABLET | Freq: Two times a day (BID) | ORAL | Status: DC
  Administered 2022-05-30: 300 mg via ORAL
  Filled 2022-05-30: qty 2

## 2022-05-30 MED ORDER — APIXABAN 5 MG PO TABS
5.0000 mg | ORAL_TABLET | Freq: Two times a day (BID) | ORAL | Status: DC
  Administered 2022-05-30: 5 mg via ORAL
  Filled 2022-05-30: qty 1

## 2022-05-30 MED ORDER — COLCHICINE 0.6 MG PO TABS
0.6000 mg | ORAL_TABLET | Freq: Two times a day (BID) | ORAL | 0 refills | Status: DC
  Filled 2022-05-30: qty 10, 5d supply, fill #0

## 2022-05-30 MED ORDER — HEPARIN (PORCINE) IN NACL 1000-0.9 UT/500ML-% IV SOLN
INTRAVENOUS | Status: DC | PRN
  Administered 2022-05-30 (×3): 500 mL

## 2022-05-30 MED ORDER — SODIUM CHLORIDE 0.9 % IV SOLN: INTRAVENOUS | Status: DC

## 2022-05-30 MED ORDER — SODIUM CHLORIDE 0.9 % IV SOLN: 250.0000 mL | INTRAVENOUS | Status: DC | PRN

## 2022-05-30 MED ORDER — LIDOCAINE HCL (CARDIAC) PF 100 MG/5ML IV SOSY
PREFILLED_SYRINGE | INTRAVENOUS | Status: DC | PRN
  Administered 2022-05-30: 60 mg via INTRAVENOUS

## 2022-05-30 MED ORDER — PANTOPRAZOLE SODIUM 40 MG PO TBEC
40.0000 mg | DELAYED_RELEASE_TABLET | Freq: Every day | ORAL | 0 refills | Status: DC
  Filled 2022-05-30: qty 45, 45d supply, fill #0

## 2022-05-30 MED ORDER — ACETAMINOPHEN 325 MG PO TABS
650.0000 mg | ORAL_TABLET | ORAL | Status: DC | PRN
  Administered 2022-05-30: 650 mg via ORAL
  Filled 2022-05-30: qty 2

## 2022-05-30 SURGICAL SUPPLY — 19 items
BAG SNAP BAND KOVER 36X36 (MISCELLANEOUS) IMPLANT
BLANKET WARM UNDERBOD FULL ACC (MISCELLANEOUS) ×2 IMPLANT
CATH ABLAT QDOT MICRO BI TC DF (CATHETERS) IMPLANT
CATH OCTARAY 2.0 F 3-3-3-3-3 (CATHETERS) IMPLANT
CATH S-M CIRCA TEMP PROBE (CATHETERS) IMPLANT
CATH SOUNDSTAR ECO 8FR (CATHETERS) IMPLANT
CATH WEBSTER BI DIR CS D-F CRV (CATHETERS) IMPLANT
CLOSURE PERCLOSE PROSTYLE (VASCULAR PRODUCTS) IMPLANT
COVER SWIFTLINK CONNECTOR (BAG) ×2 IMPLANT
PACK EP LATEX FREE (CUSTOM PROCEDURE TRAY) ×1
PACK EP LF (CUSTOM PROCEDURE TRAY) ×2 IMPLANT
PAD DEFIB RADIO PHYSIO CONN (PAD) ×2 IMPLANT
PATCH CARTO3 (PAD) IMPLANT
SHEATH BAYLIS TRANSSEPTAL 98CM (NEEDLE) IMPLANT
SHEATH CARTO VIZIGO SM CVD (SHEATH) IMPLANT
SHEATH PINNACLE 8F 10CM (SHEATH) IMPLANT
SHEATH PINNACLE 9F 10CM (SHEATH) IMPLANT
SHEATH PROBE COVER 6X72 (BAG) IMPLANT
TUBING SMART ABLATE COOLFLOW (TUBING) IMPLANT

## 2022-05-30 NOTE — Anesthesia Procedure Notes (Signed)
Procedure Name: Intubation Date/Time: 05/30/2022 7:46 AM  Performed by: Kyung Rudd, CRNAPre-anesthesia Checklist: Patient identified, Emergency Drugs available, Suction available and Patient being monitored Patient Re-evaluated:Patient Re-evaluated prior to induction Oxygen Delivery Method: Circle system utilized Preoxygenation: Pre-oxygenation with 100% oxygen Induction Type: IV induction Ventilation: Mask ventilation without difficulty and Oral airway inserted - appropriate to patient size Laryngoscope Size: Glidescope and 3 Tube type: Oral Tube size: 7.0 mm Number of attempts: 1 Airway Equipment and Method: Stylet and Video-laryngoscopy Placement Confirmation: ETT inserted through vocal cords under direct vision, positive ETCO2 and breath sounds checked- equal and bilateral Secured at: 21 cm Tube secured with: Tape Dental Injury: Teeth and Oropharynx as per pre-operative assessment  Comments: Grade I view on Glidescope Go screen.

## 2022-05-30 NOTE — Transfer of Care (Signed)
Immediate Anesthesia Transfer of Care Note  Patient: Suzanne Greer  Procedure(s) Performed: ATRIAL FIBRILLATION ABLATION  Patient Location: Cath Lab  Anesthesia Type:General  Level of Consciousness: awake, alert , and oriented  Airway & Oxygen Therapy: Patient Spontanous Breathing and Patient connected to nasal cannula oxygen  Post-op Assessment: Report given to RN, Post -op Vital signs reviewed and stable, and Patient moving all extremities  Post vital signs: Reviewed and stable  Last Vitals:  Vitals Value Taken Time  BP 169/122 05/30/22 0957  Temp 37.1 C 05/30/22 0956  Pulse 83 05/30/22 0958  Resp 15 05/30/22 0958  SpO2 96 % 05/30/22 0958  Vitals shown include unvalidated device data.  Last Pain:  Vitals:   05/30/22 0956  TempSrc: Temporal  PainSc: 0-No pain         Complications: No notable events documented.

## 2022-05-30 NOTE — Anesthesia Postprocedure Evaluation (Signed)
Anesthesia Post Note  Patient: Suzanne Greer  Procedure(s) Performed: ATRIAL FIBRILLATION ABLATION     Patient location during evaluation: Cath Lab Anesthesia Type: General Level of consciousness: awake and alert Pain management: pain level controlled Vital Signs Assessment: post-procedure vital signs reviewed and stable Respiratory status: spontaneous breathing, nonlabored ventilation and respiratory function stable Cardiovascular status: blood pressure returned to baseline and stable Postop Assessment: no apparent nausea or vomiting Anesthetic complications: no  No notable events documented.  Last Vitals:  Vitals:   05/30/22 1330 05/30/22 1356  BP: (!) 161/72   Pulse: 89 90  Resp: 19 17  Temp:    SpO2: 94% 96%    Last Pain:  Vitals:   05/30/22 1423  TempSrc:   PainSc: 1                  Jema Deegan

## 2022-05-30 NOTE — Anesthesia Preprocedure Evaluation (Addendum)
Anesthesia Evaluation  Patient identified by MRN, date of birth, ID band Patient awake    Reviewed: Allergy & Precautions, NPO status , Patient's Chart, lab work & pertinent test results  History of Anesthesia Complications Negative for: history of anesthetic complications  Airway Mallampati: II  TM Distance: >3 FB Neck ROM: Full    Dental  (+) Teeth Intact, Dental Advisory Given   Pulmonary neg shortness of breath, asthma , sleep apnea and Continuous Positive Airway Pressure Ventilation , neg COPD, neg recent URI   breath sounds clear to auscultation       Cardiovascular hypertension, Pt. on medications + dysrhythmias Atrial Fibrillation  Rhythm:Regular     Neuro/Psych neg Seizures  Neuromuscular disease  negative psych ROS   GI/Hepatic Neg liver ROS,GERD  Medicated and Controlled,,  Endo/Other  diabetes    Renal/GU negative Renal ROS     Musculoskeletal  (+)  Fibromyalgia -  Abdominal   Peds  Hematology Eliquis  Lab Results      Component                Value               Date                      WBC                      10.4                05/09/2022                HGB                      12.5                05/09/2022                HCT                      38.6                05/09/2022                MCV                      83                  05/09/2022                PLT                      358                 05/09/2022              Anesthesia Other Findings   Reproductive/Obstetrics                             Anesthesia Physical Anesthesia Plan  ASA: 2  Anesthesia Plan: General   Post-op Pain Management: Minimal or no pain anticipated   Induction: Intravenous  PONV Risk Score and Plan: 2 and Ondansetron, Dexamethasone, Propofol infusion, TIVA and Treatment may vary due to age or medical condition  Airway Management Planned: Oral ETT  Additional Equipment:  None  Intra-op Plan:   Post-operative Plan: Extubation in  OR  Informed Consent: I have reviewed the patients History and Physical, chart, labs and discussed the procedure including the risks, benefits and alternatives for the proposed anesthesia with the patient or authorized representative who has indicated his/her understanding and acceptance.     Dental advisory given  Plan Discussed with: CRNA  Anesthesia Plan Comments:        Anesthesia Quick Evaluation

## 2022-05-30 NOTE — Discharge Instructions (Signed)

## 2022-05-30 NOTE — H&P (Signed)
Electrophysiology Office Note:     Date:  05/30/2022    ID:  Suzanne Greer, Suzanne Greer 1950/09/30, MRN [id number]   PCP:  Vernie Shanks, MD (Inactive)      [facility] Cardiologist:  Peter Martinique, MD  [place] Electrophysiologist:  Vickie Epley, MD    Referring MD: Martinique, Peter M, MD    Chief Complaint: New patient consult for Afib/Aflutter ablation   History of Present Illness:     Suzanne Greer is a 71 y.o. female who presents for an evaluation for Afib/Aflutter ablation at the request of Dr. Martinique. Their medical history includes atrial fibrillation, atrial flutter, hypertension, hyperlipidemia, diabetes, fibromyalgia, GERD, Meniere disease, obesity, and OSA on CPAP.   She saw Dr. Martinique on 11/23/2021. It was noted that she was in Afib with RVR 03/2021 at [facility]. She was admitted to the hospital secondary to nausea, vomiting, and 1 episode of witnessed seizure in the ED (felt to be secondary to severe hypomagnesemia). At her appointment with Dr. Martinique she reported heart rates as high as the 130's per her heart rate monitor. She was taking propafenone 2x daily, but would take a third dose if out of rhythm. Her EKG showed Atrial flutter with variable AV conduction. Rate 96. Low voltage. She wanted to consider switching from Eliquis to Warfarin due to expensive costs. Propafenone was increased to TID. She was referred to EP for consideration of ablation.   Today, she reports that her initial episode of atrial fibrillation was found a long time ago. She had gone to the gym, walked up the stairs and went on an exercise bike. Her heart rate was measured at 120 bpm prior to beginning her exercise.   When she is in atrial fibrillation, she usually feels fatigued. If her heart rate goes above 100 bpm, she begins to feel a "chest tightening." Currently she is not sure how often her episodes of Afib occur; she generally tries to ignore her symptoms unless her heart beat  is faster than 120 bpm. When her medicines are working, her resting heart rate is usually in the 60's.   She confirms her regimen includes propafenone, metoprolol, and diltiazem. Compliant with Eliquis, no bleeding issues.   They deny any shortness of breath, or peripheral edema. No lightheadedness, headaches, syncope, orthopnea, or PND.   Presents for PVI today. Procedure reviewed.   Objective      Past Medical History:  Diagnosis Date   Atrial fibrillation ([place])     Back pain     Diabetes mellitus     Dysrhythmia      history Atrial Flutter. 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   Obesity     Restless legs     Sleep apnea      no cpap use now-unable to tolerate mask, surgery a yr ago   Vertigo             Past Surgical History:  Procedure Laterality Date   APPENDECTOMY       BUNIONECTOMY       ESOPHAGOGASTRODUODENOSCOPY (EGD) WITH PROPOFOL N/A 08/16/2015    Procedure: ESOPHAGOGASTRODUODENOSCOPY (EGD) WITH  PROPOFOL;  Surgeon: Garlan Fair, MD;  Location: [facility];  Service: Endoscopy;  Laterality: N/A;   GANGLION CYST    1980    REMOVED    HEMORROIDECTOMY       THYROIDECTOMY, PARTIAL   2005    "partial for growth-benign"   UPPER ENDOSCOPIC ULTRASOUND W/ FNA       UVULECTOMY        excised about a yr ago [facility]      Current Medications: Active Medications      Current Meds  Medication Sig   albuterol (VENTOLIN HFA) 108 (90 Base) MCG/ACT inhaler Inhale 2 puffs into the lungs every 6 (six) hours as needed for wheezing or shortness of breath.   atorvastatin (LIPITOR) 20 MG tablet Take 20 mg by mouth daily.   cyclobenzaprine (FLEXERIL) 10 MG tablet TAKE 1 TABLET BY MOUTH EVERYDAY AT BEDTIME. Call  [phone] to schedule yearly follow up for ongoing refills (Patient taking differently: Take 10 mg by mouth at bedtime as needed for muscle spasms. Call [phone] to schedule yearly follow up for ongoing refills)   diltiazem (CARDIZEM CD) 120 MG 24 hr capsule TAKE 1 CAPSULE BY MOUTH EVERY DAY   ELIQUIS 5 MG TABS tablet TAKE 1 TABLET BY MOUTH TWICE A DAY   fluticasone (FLONASE) 50 MCG/ACT nasal spray Place 2 sprays into both nostrils daily.   Magnesium 400 MG TABS Take 400 mg by mouth 2 (two) times daily.   Melatonin 10 MG TABS Take 10 mg by mouth at bedtime.    metFORMIN (GLUCOPHAGE-XR) 500 MG 24 hr tablet Take 1,000 mg by mouth 2 (two) times daily.    metoprolol succinate (TOPROL-XL) 50 MG 24 hr tablet Take 1 tablet (50 mg total) by mouth daily.   montelukast (SINGULAIR) 10 MG tablet Take 10 mg by mouth at bedtime.   omeprazole (PRILOSEC) 40 MG capsule Take 40 mg by mouth daily.   propafenone (RYTHMOL) 300 MG tablet TAKE 1 TABLET (300 MG TOTAL) BY MOUTH EVERY 8 (EIGHT) HOURS.   traMADol (ULTRAM) 50 MG tablet Take 50 mg by mouth 3 (three) times daily as needed (Pain).    VITAMIN D PO Take 1 tablet by mouth daily.        Allergies:   Doxycycline, Ozempic (0.25 or 0.5 mg-dose) [semaglutide(0.25 or 0.'5mg'$ -dos)], Yeast-related products, Molds & smuts, Penicillins, and Sulfa drugs cross reactors    Social History         Socioeconomic History   Marital status: Divorced      Spouse name: Not on file   Number of children: 1   Years of education: college   Highest education level: Bachelor's degree (e.g., BA, AB, BS)  Occupational History   Occupation: Disability  Tobacco Use   Smoking status: Never   Smokeless tobacco: Never  Substance and Sexual Activity   Alcohol use: No   Drug use: No   Sexual activity: Not on file  Other Topics Concern   Not on file  Social History Narrative    Lives at home alone.    Right-handed.    Occasional use of caffeine.    Social Determinants of  Health        Financial Resource Strain: Not on file  Food Insecurity: No Food Insecurity (04/02/2020)    Hunger Vital Sign     Worried About Running Out of Food in the Last Year: Never true     Ran Out of Food in the Last Year: Never true  Transportation Needs: Not on file  Physical Activity: Not on file  Stress: Not on file  Social Connections: Not on file      Family History: The patient's family history includes Cancer (age of onset: 49) in her mother; Diabetes in her father; Drug abuse in her brother; Heart disease in her mother; Hepatitis C in her brother.   ROS:   Please see the history of present illness.    (+) Fatigue (+) Chest tightening All other systems reviewed and are negative.   EKGs/Labs/Other Studies Reviewed:     The following studies were reviewed today:   07/30/2019  Echocardiogram:  1. Left ventricular ejection fraction by 3D volume is 57 %. The left  ventricle has normal function. The left ventrical has no regional wall  motion abnormalities. There is mildly increased concentric left  ventricular hypertrophy. Left ventricular  diastolic parameters are consistent with Grade I diastolic dysfunction  (impaired relaxation). The average left ventricular global longitudinal  strain is -18.5 %.   2. Right ventricular systolic function is mildly reduced. The right  ventricular size is normal. Tricuspid regurgitation signal is inadequate  for assessing PA pressure.   3. The mitral valve is normal in structure and function. trivial mitral  valve regurgitation. No evidence of mitral stenosis.   4. The aortic valve is normal in structure and function. Aortic valve  regurgitation is not visualized. No aortic stenosis is present.     EKG:   EKG is personally reviewed.        Recent Labs: 04/01/2021: ALT 13 04/04/2021: BUN 5; Creatinine, Ser 0.72; Hemoglobin 11.7; Magnesium 1.9; Platelets 325; Potassium 4.3; Sodium 137    Recent Lipid Panel Labs (Brief)   No results found for: "CHOL", "TRIG", "HDL", "CHOLHDL", "VLDL", "LDLCALC", "LDLDIRECT"     Physical Exam:     VS:  BP 159/58   Pulse 70   Ht '5\' 5"'$  (1.651 m)   Wt 224 lb (101.6 kg)   BMI 37.28 kg/m         Wt Readings from Last 3 Encounters:  01/12/22 224 lb (101.6 kg)  11/23/21 217 lb (98.4 kg)  04/11/21 213 lb (96.6 kg)      GEN: Well nourished, well developed in no acute distress.  Obese HEENT: Normal NECK: No JVD; No carotid bruits LYMPHATICS: No lymphadenopathy CARDIAC: RRR, no murmurs, rubs, gallops RESPIRATORY:  Clear to auscultation without rales, wheezing or rhonchi  ABDOMEN: Soft, non-tender, non-distended MUSCULOSKELETAL:  No edema; No deformity  SKIN: Warm and dry NEUROLOGIC:  Alert and oriented x 3 PSYCHIATRIC:  Normal affect          Assessment ASSESSMENT:     1. Paroxysmal atrial fibrillation (HCC)   2. Typical atrial flutter ([place])   3. OSA (obstructive sleep apnea)     PLAN:     In order of problems listed above:   #Paroxysmal atrial fibrillation Continues to have symptomatic breakthrough episodes of atrial fibrillation despite treatment with propafenone, metoprolol and diltiazem.  Also experiencing fatigue secondary to these medications.  We discussed treatment options today including alternative antiarrhythmic drug and catheter ablation and she wishes to proceed with scheduling an ablation.  We also discussed watchman given her concerns about the cost and trouble associated with anticoagulation.  I do think she would be a candidate for both procedures.  She would like to proceed with scheduling the ablation will let us know how she would like to proceed regarding the watchman implant.   Discussed treatment  options today for his AF including antiarrhythmic drug therapy and ablation. Discussed risks, recovery and likelihood of success. Discussed potential need for repeat ablation procedures and antiarrhythmic drugs after an initial ablation. They wish  to proceed with scheduling.   Risk, benefits, and alternatives to EP study and radiofrequency ablation for afib were also discussed in detail today. These risks include but are not limited to stroke, bleeding, vascular damage, tamponade, perforation, damage to the esophagus, lungs, and other structures, pulmonary vein stenosis, worsening renal function, and death. The patient understands these risk and wishes to proceed.  We will therefore proceed with catheter ablation at the next available time.  Carto, ICE, anesthesia are requested for the procedure.  Will also obtain CT PV protocol prior to the procedure to exclude LAA thrombus and further evaluate atrial anatomy.   Presents for PVI today. Procedure reviewed.     Signed, Hilton Cork. Quentin Ore, MD, [facility], [facility] 05/30/2022 Electrophysiology [facility]

## 2022-06-02 DIAGNOSIS — K644 Residual hemorrhoidal skin tags: Secondary | ICD-10-CM | POA: Diagnosis not present

## 2022-06-21 ENCOUNTER — Telehealth: Payer: Self-pay | Admitting: *Deleted

## 2022-06-21 NOTE — Telephone Encounter (Signed)
   Pre-operative Risk Assessment    Patient Name: Suzanne Greer  DOB: 03-21-1951 MRN: [id number]     Request for Surgical Clearance    Procedure:  Dental Extraction - Amount of Teeth to be Pulled:  1 SURGICAL REMOVAL ON A NON-RESTORABLE TOOTH & FAILING IMPLANT   Date of Surgery:  Clearance 07/07/22                                 Surgeon:  Ray Church, MD Surgeon's Group or Practice Name:  [facility] Phone number:  [phone] Fax number:  [phone]   Type of Clearance Requested:   -Medical  - Pharmacy:  Hold Apixaban (Eliquis) 2 DAYS PRIOR   Type of Anesthesia:   SEDATION OF MEDICALLY CLEARED   Additional requests/questions:    Signed, Jeanann Lewandowsky   06/21/2022, 9:10 AM

## 2022-06-21 NOTE — Telephone Encounter (Signed)
Pt scheduled for surgical removal of tooth and failed implant post. On eliquis for Afib, had ablation on 05/30/22.  I'm not sure we would typically hold eliquis for post removal, complicated by recent ablation. Appreciate your input.

## 2022-06-22 NOTE — Telephone Encounter (Signed)
Pt cannot hold anticoag for 90 days post ablation on 05/30/22. Would see if extraction can be done with pt continuing on anticoagulation. Otherwise, procedure would need to be moved out to mid March. She does not require SBE ppx.

## 2022-06-27 ENCOUNTER — Ambulatory Visit (HOSPITAL_COMMUNITY)
Admission: RE | Admit: 2022-06-27 | Discharge: 2022-06-27 | Disposition: A | Discharge status: Home / Self Care | Payer: Medicare Other | Source: Ambulatory Visit | Attending: Nurse Practitioner | Admitting: Nurse Practitioner

## 2022-06-27 VITALS — BP 126/66 | HR 78 | Ht 66.0 in | Wt 232.4 lb

## 2022-06-27 DIAGNOSIS — Z7901 Long term (current) use of anticoagulants: Secondary | ICD-10-CM | POA: Diagnosis not present

## 2022-06-27 DIAGNOSIS — R9431 Abnormal electrocardiogram [ECG] [EKG]: Secondary | ICD-10-CM | POA: Insufficient documentation

## 2022-06-27 DIAGNOSIS — I4891 Unspecified atrial fibrillation: Secondary | ICD-10-CM | POA: Diagnosis not present

## 2022-06-27 DIAGNOSIS — Z79899 Other long term (current) drug therapy: Secondary | ICD-10-CM | POA: Diagnosis not present

## 2022-06-27 DIAGNOSIS — D6869 Other thrombophilia: Secondary | ICD-10-CM

## 2022-06-27 DIAGNOSIS — I480 Paroxysmal atrial fibrillation: Secondary | ICD-10-CM | POA: Diagnosis not present

## 2022-06-27 NOTE — Progress Notes (Signed)
Primary Care Physician: Faustino Congress, NP Referring Physician: Dr. Janan Ridge Suzanne Greer is a 72 y.o. female with a h/o afib that underwent afib ablation on 12/12. She is in SR today and has noted a few elevated HR's to 100 bpm, but nothing  that was sustained. No swallowing or groin issues. She is back to her ususal activities.   Today, she denies symptoms of palpitations, chest pain, shortness of breath, orthopnea, PND, lower extremity edema, dizziness, presyncope, syncope, or neurologic sequela. The patient is tolerating medications without difficulties and is otherwise without complaint today.   Past Medical History:  Diagnosis Date   Atrial fibrillation ([place])    Back pain    Diabetes mellitus    Dysrhythmia    history Atrial Flutter. 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   Obesity    Restless legs    Sleep apnea    no cpap use now-unable to tolerate mask, surgery a yr ago   Vertigo    Past Surgical History:  Procedure Laterality Date   APPENDECTOMY     ATRIAL FIBRILLATION ABLATION N/A 05/30/2022   Procedure: [place];  Surgeon: Vickie Epley, MD;  Location: [facility];  Service: Cardiovascular;  Laterality: N/A;   BUNIONECTOMY     ESOPHAGOGASTRODUODENOSCOPY (EGD) WITH PROPOFOL N/A 08/16/2015   Procedure: ESOPHAGOGASTRODUODENOSCOPY (EGD) WITH PROPOFOL;  Surgeon: Garlan Fair, MD;  Location: [facility];  Service: Endoscopy;  Laterality: N/A;   GANGLION CYST   1980   REMOVED    HEMORROIDECTOMY     THYROIDECTOMY, PARTIAL  2005   "partial for growth-benign"   UPPER ENDOSCOPIC ULTRASOUND W/ FNA     UVULECTOMY     excised about a yr ago [facility]    Current Outpatient  Medications  Medication Sig Dispense Refill   albuterol (VENTOLIN HFA) 108 (90 Base) MCG/ACT inhaler Inhale 2 puffs into the lungs every 6 (six) hours as needed for wheezing or shortness of breath.     atorvastatin (LIPITOR) 20 MG tablet Take 20 mg by mouth at bedtime.     cetirizine (ZYRTEC) 10 MG tablet Take 10 mg by mouth daily.     Cholecalciferol (DIALYVITE VITAMIN D 5000) 125 MCG (5000 UT) capsule Take 5,000 Units by mouth daily.     cyclobenzaprine (FLEXERIL) 10 MG tablet TAKE 1 TABLET BY MOUTH EVERYDAY AT BEDTIME. Call [phone] to schedule yearly follow up for ongoing refills (Patient taking differently: Take 10 mg by mouth at bedtime as needed for muscle spasms. Call [phone] to schedule yearly follow up for ongoing refills) 15 tablet 0   diltiazem (CARDIZEM CD) 120 MG 24 hr capsule TAKE 1 CAPSULE BY MOUTH EVERY DAY 90 capsule 3   ELIQUIS 5 MG TABS tablet TAKE 1 TABLET BY MOUTH TWICE A DAY 180 tablet 3   fluticasone (FLONASE) 50 MCG/ACT nasal spray Place 1 spray into both nostrils 2 (two) times daily.     Magnesium 400 MG TABS Take 400 mg by mouth 2 (two) times daily.     Melatonin 10 MG TABS Take 10 mg by mouth at bedtime.      metFORMIN (GLUCOPHAGE-XR) 500 MG 24 hr tablet Take 1,000  mg by mouth 2 (two) times daily.      metoprolol succinate (TOPROL-XL) 50 MG 24 hr tablet Take 1 tablet (50 mg total) by mouth daily. 90 tablet 3   montelukast (SINGULAIR) 10 MG tablet Take 10 mg by mouth at bedtime.     pantoprazole (PROTONIX) 40 MG tablet Take 1 tablet (40 mg total) by mouth daily. 45 tablet 0   propafenone (RYTHMOL) 300 MG tablet Take 1 tablet (300 mg total) by mouth See admin instructions. Take 300 mg twice daily, may take a third 300 mg dose in the afternoon as needed for afib     traMADol (ULTRAM) 50 MG tablet Take 50 mg by mouth 3 (three) times daily as needed (Pain).      omeprazole (PRILOSEC) 20 MG capsule Take 20 mg by mouth daily. (Patient not taking: Reported on 06/27/2022)      No current facility-administered medications for this encounter.    Allergies  Allergen Reactions   Doxycycline Diarrhea and Nausea And Vomiting     Abdominal Pain   Ozempic (0.25 Or 0.5 Mg-Dose) [Semaglutide(0.25 Or 0.'5mg'$ -Dos)] Diarrhea and Nausea And Vomiting    Dry skin "Almost made me want to die" Dropped magnesium levels    Yeast-Related Products     Flares allergies   Molds & Smuts Cough   Penicillins Itching    Has patient had a PCN reaction causing immediate rash, facial/tongue/throat swelling, SOB or lightheadedness with hypotension: No Has patient had a PCN reaction causing severe rash involving mucus membranes or skin necrosis: No Has patient had a PCN reaction that required hospitalization No Has patient had a PCN reaction occurring within the last 10 years: No If all of the above answers are "NO", then may proceed with Cephalosporin use.   Sulfa Drugs Cross Reactors Itching    Social History   Socioeconomic History   Marital status: Divorced    Spouse name: Not on file   Number of children: 1   Years of education: college   Highest education level: Bachelor's degree (e.g., BA, AB, BS)  Occupational History   Occupation: Disability  Tobacco Use   Smoking status: Never   Smokeless tobacco: Never  Substance and Sexual Activity   Alcohol use: No   Drug use: No   Sexual activity: Not on file  Other Topics Concern   Not on file  Social History Narrative   Lives at home alone.   Right-handed.   Occasional use of caffeine.   Social Determinants of Health   Financial Resource Strain: Not on file  Food Insecurity: No Food Insecurity (04/02/2020)   Hunger Vital Sign    Worried About Running Out of Food in the Last Year: Never true    Ran Out of Food in the Last Year: Never true  Transportation Needs: Not on file  Physical Activity: Not on file  Stress: Not on file  Social Connections: Not on file  Intimate Partner Violence: Not on file    Family  History  Problem Relation Age of Onset   Cancer Mother 71       PERITONEAL CANCER   Heart disease Mother    Diabetes Father        unsure of complete medical hx - not seen since her 42s   Hepatitis C Brother    Drug abuse Brother     ROS- All systems are reviewed and negative except as per the HPI above  Physical Exam: Vitals:   06/27/22 1106  BP:  126/66  Pulse: 78  Weight: 105.4 kg  Height: '5\' 6"'$  (1.676 m)   Wt Readings from Last 3 Encounters:  06/27/22 105.4 kg  05/30/22 104.3 kg  01/12/22 101.6 kg    Labs: Lab Results  Component Value Date   NA 140 05/09/2022   K 4.4 05/09/2022   CL 100 05/09/2022   CO2 26 05/09/2022   GLUCOSE 163 (H) 05/09/2022   BUN 11 05/09/2022   CREATININE 0.77 05/09/2022   CALCIUM 9.0 05/09/2022   MG 1.9 04/04/2021   No results found for: "INR" No results found for: "CHOL", "HDL", "[place]", "TRIG"   GEN- The patient is well appearing, alert and oriented x 3 today.   Head- normocephalic, atraumatic Eyes-  Sclera clear, conjunctiva pink Ears- hearing intact Oropharynx- clear Neck- supple, no JVP Lymph- no cervical lymphadenopathy Lungs- Clear to ausculation bilaterally, normal work of breathing Heart- Regular rate and rhythm, no murmurs, rubs or gallops, PMI not laterally displaced GI- soft, NT, ND, + BS Extremities- no clubbing, cyanosis, or edema MS- no significant deformity or atrophy Skin- no rash or lesion Psych- euthymic mood, full affect Neuro- strength and sensation are intact  EKG-Vent. rate 78 BPM PR interval 180 ms QRS duration 82 ms QT/QTcB 360/410 ms P-R-T axes 53 72 41 Normal sinus rhythm Nonspecific T wave abnormality Abnormal ECG When compared with ECG of 30-May-2022 10:07, PREVIOUS ECG IS PRESENT    Assessment and Plan:  1. Afib  S/p afib/ typical flutter ablation  She is doing well maintaining  SR Continue metoprolol succinate 50 mg daily/diltiazem 120 mg daily  and propafenone 300 mg bid  2.  CHA2DS2VASc score of 4 Continue eliquis 5 mg bid without interruption   Butch Penny C. Shamar Kracke, [facility] [address] [place], [place] [address] [phone]

## 2022-07-03 NOTE — Telephone Encounter (Signed)
[facility] calling for an update on clearance

## 2022-07-03 NOTE — Telephone Encounter (Signed)
   Patient Name: Suzanne Greer  DOB: 08-14-1950 MRN: [id number]  Primary Cardiologist: Peter Martinique, MD  Chart reviewed as part of pre-operative protocol coverage.   Please see recommendations per our pharmacy team below.  Pt cannot hold anticoag for 90 days post ablation on 05/30/22. Would see if extraction can be done with pt continuing on anticoagulation. Otherwise, procedure would need to be moved out to mid March. She does not require SBE ppx.    Mable Fill, Marissa Nestle, NP 07/03/2022, 11:55 AM

## 2022-07-26 ENCOUNTER — Other Ambulatory Visit: Payer: Self-pay | Admitting: Cardiology

## 2022-07-27 ENCOUNTER — Encounter (HOSPITAL_COMMUNITY): Payer: Self-pay | Admitting: *Deleted

## 2022-09-05 NOTE — Progress Notes (Unsigned)
  Electrophysiology Office Follow up Visit Note:    Date:  09/06/2022   ID:  Suzanne Greer 03/17/1951, MRN [id number]  PCP:  Faustino Congress, NP  [facility] Cardiologist:  Peter Martinique, MD  [facility] Electrophysiologist:  Vickie Epley, MD    Interval History:    Suzanne Greer is a 72 y.o. female who presents for a follow up visit.   She had an AF ablation 05/30/2022. During the ablation, the veins and CTI were ablated. She saw Butch Penny 06/27/2022 and was maintaining sinus rhythm. She takes eliquis for stroke ppx. She has been on toprol, dilt and propafenone daily for rhythm control.   She tells me she has done well without recurrence of her arrhythmia.  She is tolerating her anticoagulant without bleeding issues.      Past medical, surgical, social and family history were reviewed.  ROS:   Please see the history of present illness.    All other systems reviewed and are negative.  EKGs/Labs/Other Studies Reviewed:    The following studies were reviewed today:  EKG:  The ekg ordered today demonstrates sinus rhythm.   Physical Exam:    VS:  BP 126/78   Pulse 69   Ht 5\' 5"  (1.651 m)   Wt 230 lb (104.3 kg)   SpO2 97%   BMI 38.27 kg/m     Wt Readings from Last 3 Encounters:  09/06/22 230 lb (104.3 kg)  06/27/22 232 lb 6.4 oz (105.4 kg)  05/30/22 230 lb (104.3 kg)     GEN:  Well nourished, well developed in no acute distress CARDIAC: RRR, no murmurs, rubs, gallops RESPIRATORY:  Clear to auscultation without rales, wheezing or rhonchi       ASSESSMENT:    1. Paroxysmal atrial fibrillation (HCC)   2. Typical atrial flutter (HCC)    PLAN:    In order of problems listed above:  #AF S/p ablation 05/30/2022. Doing well maintaining SR. Stop propafenone today. Continue metop/dilt.   #Hypertension At goal today.  Recommend checking blood pressures 1-2 times per week at home and recording the values.  Recommend bringing these recordings  to the primary care physician.  #Preop evaluation The patient is at acceptable risk to undergo dental surgery.  It is okay for her to hold her anticoagulant for 48 hours prior to the procedure and restart when felt safe from a surgical perspective.  Follow up 6 months w me.      Signed, Lars Mage, MD, [facility], [facility] 09/06/2022 3:49 PM    Electrophysiology [facility]

## 2022-09-06 ENCOUNTER — Encounter: Payer: Self-pay | Admitting: Cardiology

## 2022-09-06 ENCOUNTER — Ambulatory Visit: Payer: Medicare Other | Attending: Cardiology | Admitting: Cardiology

## 2022-09-06 ENCOUNTER — Telehealth: Payer: Self-pay | Admitting: Cardiology

## 2022-09-06 VITALS — BP 126/78 | HR 69 | Ht 65.0 in | Wt 230.0 lb

## 2022-09-06 DIAGNOSIS — I483 Typical atrial flutter: Secondary | ICD-10-CM

## 2022-09-06 DIAGNOSIS — I480 Paroxysmal atrial fibrillation: Secondary | ICD-10-CM | POA: Insufficient documentation

## 2022-09-06 NOTE — Telephone Encounter (Signed)
Patient states she needs new code, please advise

## 2022-09-06 NOTE — Patient Instructions (Signed)
Medication Instructions:  STOP Propafenone  *If you need a refill on your cardiac medications before your next appointment, please call your pharmacy*   Follow-Up: At [facility], you and your health needs are our priority.  As part of our continuing mission to provide you with exceptional heart care, we have created designated Provider Care Teams.  These Care Teams include your primary Cardiologist (physician) and Advanced Practice Providers (APPs -  Physician Assistants and Nurse Practitioners) who all work together to provide you with the care you need, when you need it.   Your next appointment:   6 month(s)  Provider:   Lars Mage, MD

## 2022-09-06 NOTE — Telephone Encounter (Signed)
Pt c/o medication issue:  1. Name of Medication: ELIQUIS 5 MG TABS tablet   2. How are you currently taking this medication (dosage and times per day)? TAKE 1 TABLET BY MOUTH TWICE A DAY   3. Are you having a reaction (difficulty breathing--STAT)? No  4. What is your medication issue? Pt stated the system at the pharmacy that takes the code for the medication was down but the pharmacy recommended that pt get a new code to see if it will work. Please advise

## 2022-09-07 NOTE — Telephone Encounter (Signed)
Left voicemail for patient to return call to office. 

## 2022-09-07 NOTE — Telephone Encounter (Signed)
I don't think getting a new copay card will solve the problem. They may still be down. In order to get a new copay card pt would need to call [phone] Eliquis

## 2022-09-11 ENCOUNTER — Telehealth: Payer: Self-pay | Admitting: Cardiology

## 2022-09-11 NOTE — Telephone Encounter (Signed)
Spoke with Suzanne Greer and advised no glasses are in office [facility].  Suzanne Greer advised Dr Mardene Speak nurse is out of the office today but will forward to Dr Lambert's nurse to see if by chance she found glasses.  Suzanne Greer verbalizes understanding and thanked Therapist, sports for the call.

## 2022-09-11 NOTE — Telephone Encounter (Signed)
Patient states she had a card but due to issues with Eliquis and breach, she could not use the card.  I gave information from pharmacy and then she states has a code but needs a new one and she was told "by a man at pharmacy to call the doctor to get a new code.  I advised again to call Eliquis to see if it is a whole new card she needs due to the cyber attack.   Pharmacy message: I don't think getting a new copay card will solve the problem. They may still be down. In order to get a new copay card pt would need to call [phone] Eliquis   She does not want to call them but she said she will call and let us know if any issues.

## 2022-09-11 NOTE — Telephone Encounter (Signed)
  Pt said, she might left her prescription glasses at the office last Wednesday at Dr. Mardene Speak office. She wanted to check in if someone found it

## 2022-10-10 IMAGING — CT CT HEAD W/O CM
3 series · 15 of 47 positions shown, 18 images · non-contrast
Comparison: None.

CLINICAL DATA: Fall.  Headache.

EXAM:
CT HEAD WITHOUT CONTRAST
CT CERVICAL SPINE WITHOUT CONTRAST
TECHNIQUE: Multidetector CT imaging of the head and cervical spine was
performed following the standard protocol without intravenous
contrast. Multiplanar CT image reconstructions of the cervical spine
were also generated.

[Series 2: head 5.0 h30s · axial · 0.41mm/px · z∈[+1048,+1198]mm · 9 of 36 slices shown, 12 images]
[im 3/36  brain]
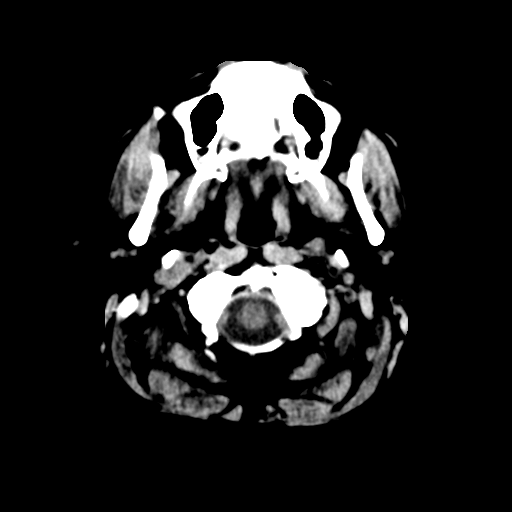
[im 3/36  bone]
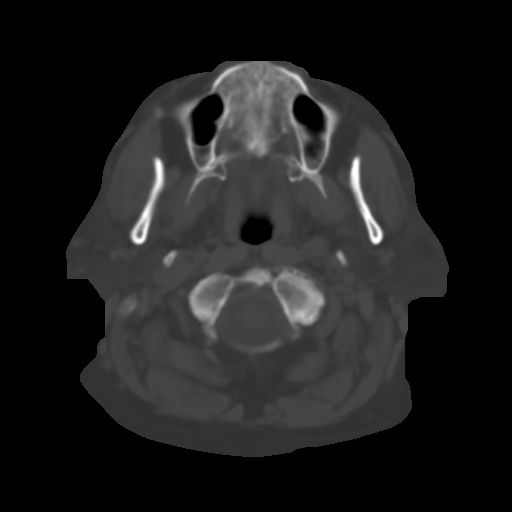
[im 7/36  brain]
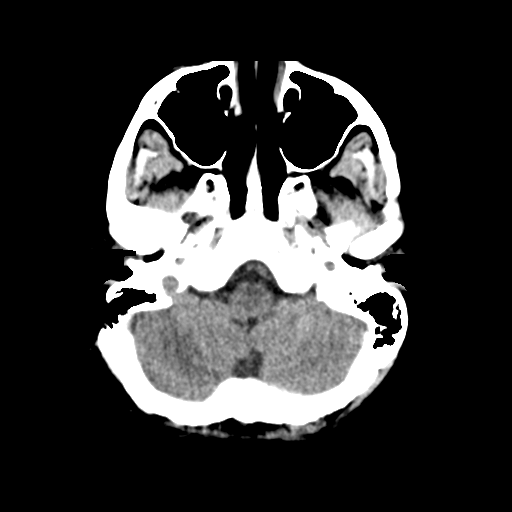
[im 10/36  brain]
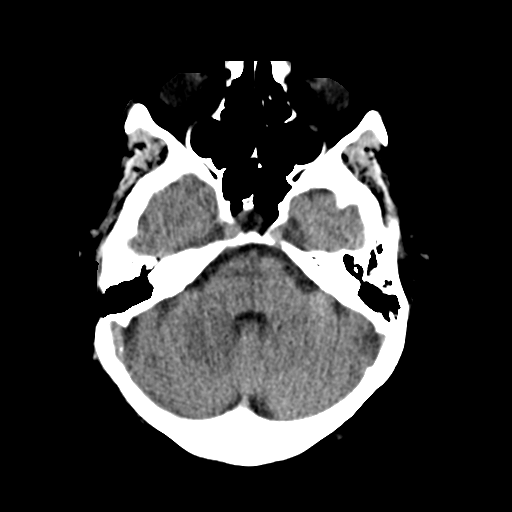
[im 14/36  brain]
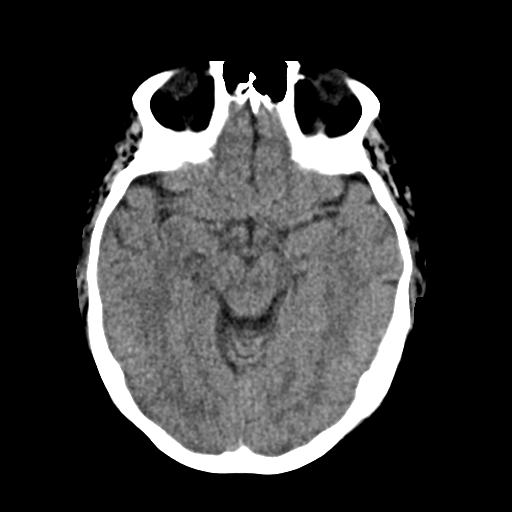
[im 19/36  brain]
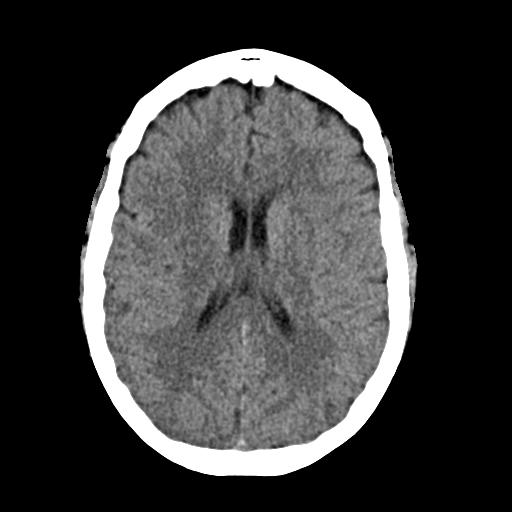
[im 19/36  bone]
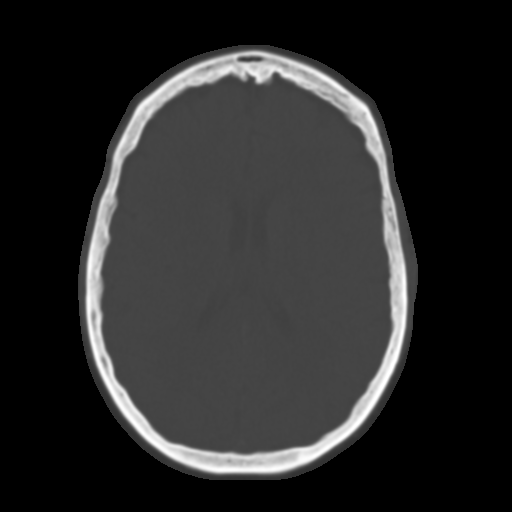
[im 22/36  brain]
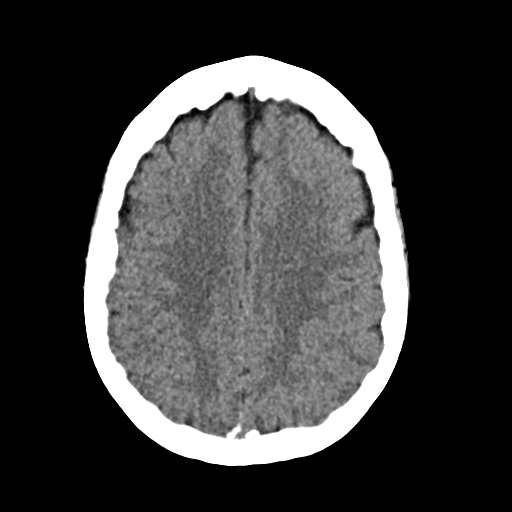
[im 26/36  brain]
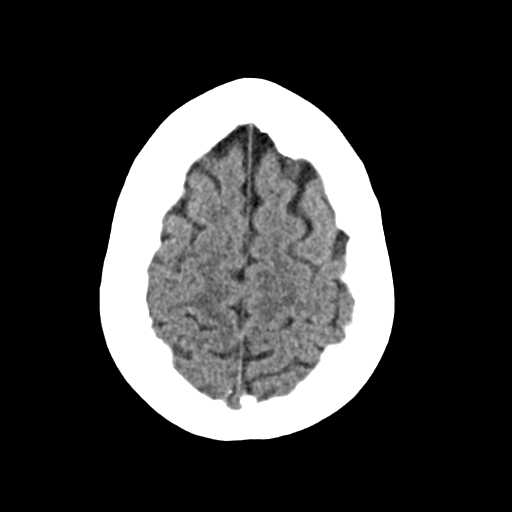
[im 29/36  brain]
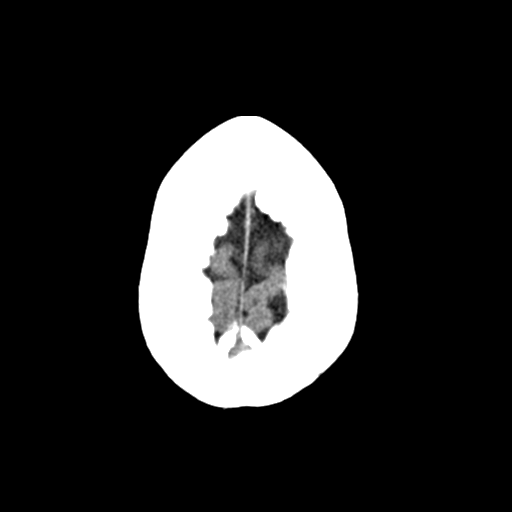
[im 33/36  brain]
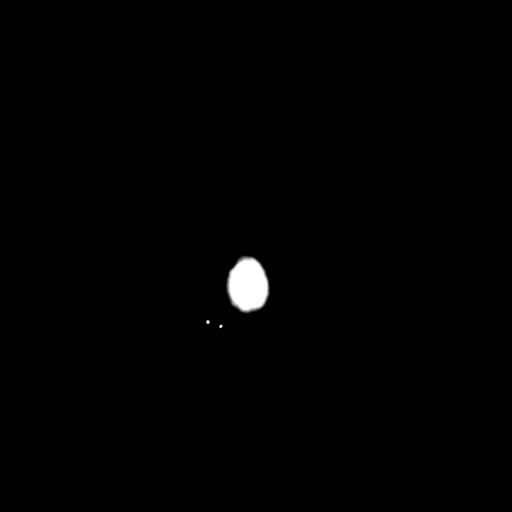
[im 33/36  bone]
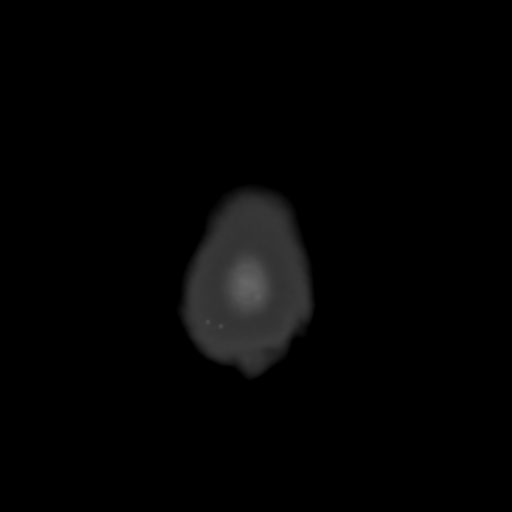

[Series 4: head 3.0 mpr cor · coronal · 0.34mm/px · 3 of 67 slices shown]
[im 23/67  brain]
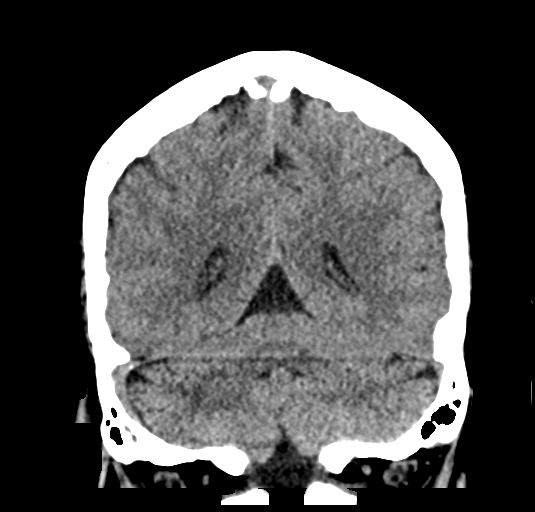
[im 30/67  brain]
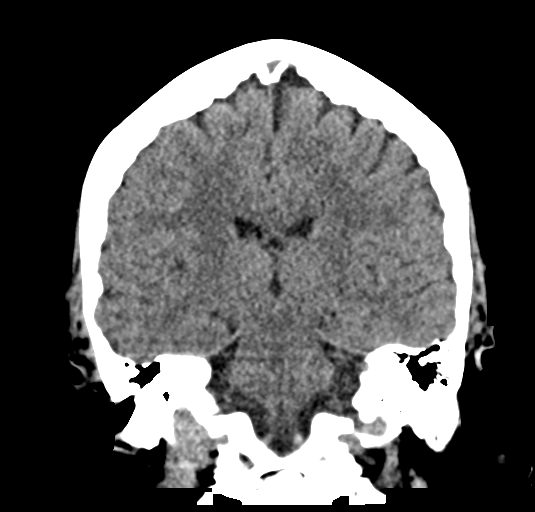
[im 37/67  brain]
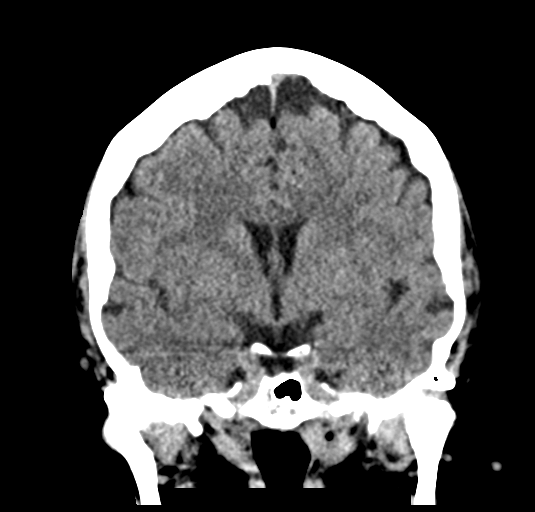

[Series 5: head 3.0 mpr sag · sagittal · 0.34mm/px · 3 of 59 slices shown]
[im 20/59  brain]
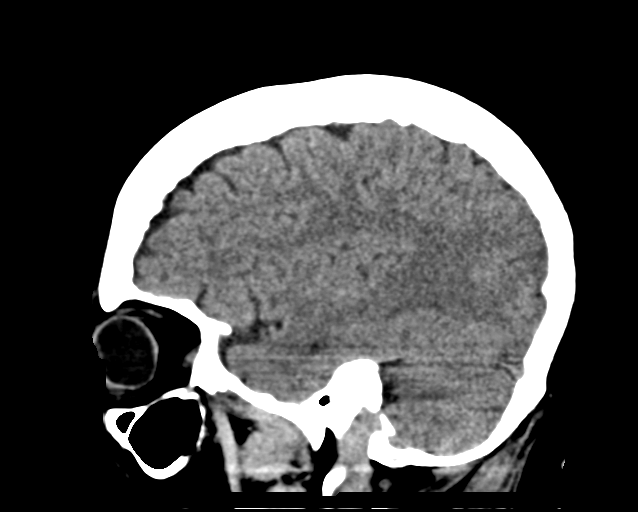
[im 30/59  brain]
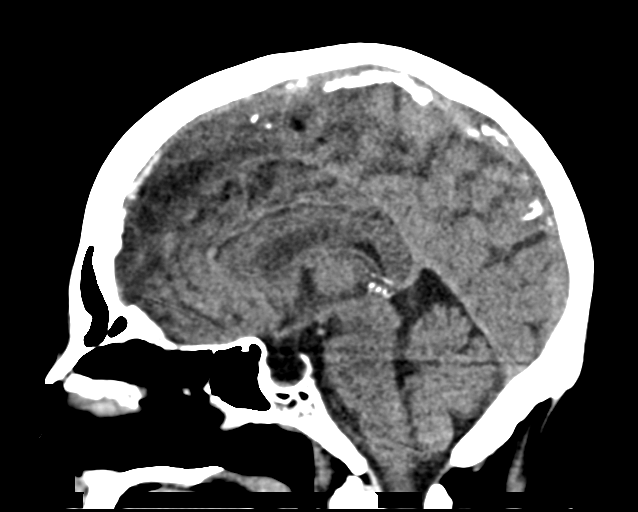
[im 39/59  brain]
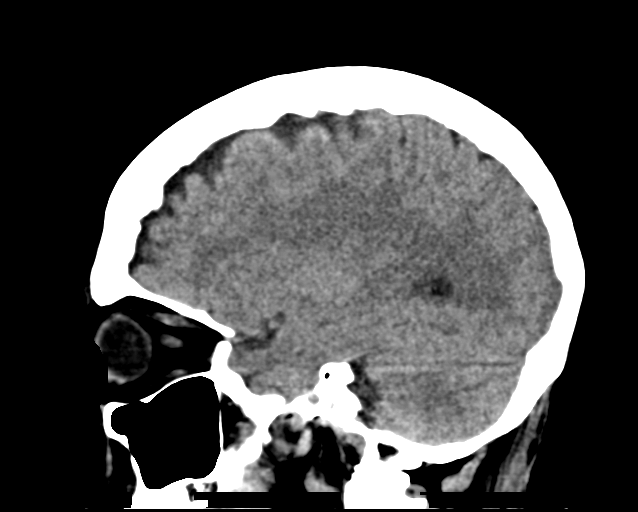

[15 of 47 positions shown; findings below may reference images not displayed]

FINDINGS: CT HEAD FINDINGS

Brain: There is no mass, hemorrhage or extra-axial collection. The
size and configuration of the ventricles and extra-axial CSF spaces
are normal. The brain parenchyma is normal, without evidence of
acute or chronic infarction.

Vascular: No abnormal hyperdensity of the major intracranial
arteries or dural venous sinuses. No intracranial atherosclerosis.

Skull: The visualized skull base, calvarium and extracranial soft
tissues are normal.

Sinuses/Orbits: No fluid levels or advanced mucosal thickening of
the visualized paranasal sinuses. No mastoid or middle ear effusion.
The orbits are normal.

CT CERVICAL SPINE FINDINGS

Alignment: No static subluxation. Facets are aligned. Occipital
condyles are normally positioned.

Skull base and vertebrae: No acute fracture.

Soft tissues and spinal canal: No prevertebral fluid or swelling. No
visible canal hematoma.

Disc levels: No advanced spinal canal or neural foraminal stenosis.

Upper chest: No pneumothorax, pulmonary nodule or pleural effusion.

Other: Normal visualized paraspinal cervical soft tissues.
IMPRESSION: 1. No acute intracranial abnormality.
2. No acute fracture or static subluxation of the cervical spine.

## 2022-11-06 ENCOUNTER — Other Ambulatory Visit: Payer: Self-pay

## 2022-11-28 ENCOUNTER — Other Ambulatory Visit: Payer: Self-pay | Admitting: Cardiology

## 2022-11-28 NOTE — Telephone Encounter (Signed)
Prescription refill request for Eliquis received. Indication: PAF Last office visit: 09/06/22 Jeanie Cooks MD Scr: 0.79 on 08/30/22  [facility] Age: 72 Weight: 104.3kg  Based on above findings Eliquis 5mg  twice daily is the appropriate dose.  Refill approved x 1. Pt past due to see Dr Lalla Brothers and Dr Swaziland. Also needs Lab work at that visit.  Message sent to schedulers.

## 2022-11-30 ENCOUNTER — Encounter: Payer: Self-pay | Admitting: Cardiology

## 2022-12-01 NOTE — Telephone Encounter (Signed)
She will need a new Coumadin appointment with the anticoag (Coumadin) clinic.

## 2022-12-04 NOTE — Telephone Encounter (Signed)
Called pt to set up New Coumadin appt with the anticoagulation clinic. Pt states she just received her last refill of Eliquis and she now has 90 day supply; however, after when she runs our of this 90 day supply, she wishes to switch to Warfarin. I educated pt on Warfarin, the need to monitor INR and the location of the Anticoagulation clinic. I instructed pt to call Coumadin Clinic [phone]) when she has 2-3 week supply left of Eliquis to receive transition dosing instructions, a Warfarin prescription, and to scheduled a anticoagulation clinic appt. Pt verbalized understanding.

## 2023-01-28 ENCOUNTER — Other Ambulatory Visit: Payer: Self-pay | Admitting: Cardiology

## 2023-01-29 NOTE — Telephone Encounter (Signed)
Prescription refill request for Eliquis received. Indication:afib Last office visit:3/24 Scr:0.77  11/23 Age: 72 Weight:104.3  kg  Prescription refilled

## 2023-03-06 ENCOUNTER — Ambulatory Visit: Payer: Medicare Other | Admitting: Student

## 2023-04-17 NOTE — Progress Notes (Unsigned)
  Electrophysiology Office Note:   Date:  04/18/2023  ID:  [name], [name] 09/07/1950, MRN [id number]  Primary Cardiologist: Peter Swaziland, MD Electrophysiologist: Lanier Prude, MD      History of Present Illness:   Suzanne Greer is a 72 y.o. female with h/o  paroxysmal atrial fibrillation and typical atrial flutter seen today for routine electrophysiology followup.   Since last being seen in our clinic the patient reports doing very well.  she denies chest pain, palpitations, dyspnea, PND, orthopnea, nausea, vomiting, dizziness, syncope, edema, weight gain, or early satiety.   Review of systems complete and found to be negative unless listed in HPI.   EP Information / Studies Reviewed:    EKG is ordered today. Personal review as below.  EKG Interpretation Date/Time:  Wednesday April 18 2023 10:34:56 EDT Ventricular Rate:  82 PR Interval:  160 QRS Duration:  78 QT Interval:  360 QTC Calculation: 420 R Axis:   33  Text Interpretation: Normal sinus rhythm Nonspecific ST and T wave abnormality Confirmed by Maxine Glenn [phone]) on 04/18/2023 10:43:15 AM    Arrhythmia History  S/p AF ablation 05/30/2022  Physical Exam:   VS:  BP (!) 146/82   Pulse 82   Ht 5\' 5"  (1.651 m)   Wt 222 lb 6.4 oz (100.9 kg)   SpO2 98%   BMI 37.01 kg/m     Wt Readings from Last 3 Encounters:  04/18/23 222 lb 6.4 oz (100.9 kg)  09/06/22 230 lb (104.3 kg)  06/27/22 232 lb 6.4 oz (105.4 kg)     GEN: Well nourished, well developed in no acute distress NECK: No JVD; No carotid bruits CARDIAC: Irregularly irregular rate and rhythm, no murmurs, rubs, gallops RESPIRATORY:  Clear to auscultation without rales, wheezing or rhonchi  ABDOMEN: Soft, non-tender, non-distended EXTREMITIES:  No edema; No deformity   ASSESSMENT AND PLAN:    AF S/p ablation 05/30/2022 EKG today shows NSR Stable off propafenone Continue metop Stop diltiazem, it was the last medicine added for rate  control prior to ablation.  Continue Eliquis for CHA2DS2/VASc of at least 4. This costs her ~$70 a month.  Encouraged to continue as it is the safer agent when compared to coumadin.   HTN Stable on current regimen    Follow up with Dr. Lalla Brothers in 6 months  Signed, Graciella Freer, PA-C

## 2023-04-18 ENCOUNTER — Ambulatory Visit: Payer: Medicare Other | Attending: Student | Admitting: Student

## 2023-04-18 ENCOUNTER — Encounter: Payer: Self-pay | Admitting: Student

## 2023-04-18 VITALS — BP 146/82 | HR 82 | Ht 65.0 in | Wt 222.4 lb

## 2023-04-18 DIAGNOSIS — I480 Paroxysmal atrial fibrillation: Secondary | ICD-10-CM | POA: Insufficient documentation

## 2023-04-18 DIAGNOSIS — I483 Typical atrial flutter: Secondary | ICD-10-CM | POA: Diagnosis present

## 2023-04-18 DIAGNOSIS — I10 Essential (primary) hypertension: Secondary | ICD-10-CM | POA: Insufficient documentation

## 2023-04-18 NOTE — Patient Instructions (Signed)
Medication Instructions:  Stop diltiazem (Cardizem) *If you need a refill on your cardiac medications before your next appointment, please call your pharmacy*  Lab Work: None ordered If you have labs (blood work) drawn today and your tests are completely normal, you will receive your results only by: MyChart Message (if you have MyChart) OR A paper copy in the mail If you have any lab test that is abnormal or we need to change your treatment, we will call you to review the results.  Follow-Up: At [facility], you and your health needs are our priority.  As part of our continuing mission to provide you with exceptional heart care, we have created designated Provider Care Teams.  These Care Teams include your primary Cardiologist (physician) and Advanced Practice Providers (APPs -  Physician Assistants and Nurse Practitioners) who all work together to provide you with the care you need, when you need it.  Your next appointment:   6 month(s)  Provider:   Casimiro Needle "Otilio Saber, PA-C

## 2023-08-15 ENCOUNTER — Other Ambulatory Visit: Payer: Self-pay | Admitting: Cardiology

## 2023-08-16 NOTE — Telephone Encounter (Signed)
 Prescription refill request for Eliquis received. Indication: PAF Last office visit:  03/3023  Polly Cobia PA-C Scr: 0.74 on 06/29/23  [facility] Age: 73 Weight: 100.9kg  Based on above findings Eliquis 5mg  twice daily is the appropriate dose.  Refill approved.

## 2023-10-30 NOTE — Progress Notes (Unsigned)
 Cardiology Office Note   Date:  11/02/2023   ID:  [name], [name] 1950/12/24, MRN [id number]  PCP:  Arlys Berke, MD  Cardiologist:   Torry Istre [place], MD   Chief Complaint  Patient presents with   Atrial Fibrillation   Coronary Artery Disease     History of Present Illness: Suzanne Greer is a 73 y.o. female who is seen for follow up Atrial fibrillation/flutter. Last seen by me in June 2023. She has a history of HTN, OSA, and DM.  Has been in Rhythmol and Eliquis  for years. She had a remote Echo in 2011 that showed Mild LVH otherwise normal. She reports a Myoview study years ago at [facility] that apparently was normal. She is on CPAP therapy. She reports that in December she was having increased symptoms with Afib and was placed on diltiazem  in addition to propafenone  and metoprolol . She states she was having episodes at least 2 days a week. When her HR is over 100 she feels palpitations. When HR is up to 150 she feels extremely tired and has chest tightness and pressure like something sitting on her chest. She does have chronic dizziness but attributes this to Meniere's disease. She does not drink caffeine.  Echo in February 2021 was normal.  She was admitted to hospital service in October 2022 secondary to nausea vomiting and actually had 1 episode of witnessed seizure in the ED as well. Initially at [facility] she had some Afib with RVR.  She was found to have significantly low magnesium .  Seizure was thought to be secondary to severe hypomagnesemia. N/V felt to be related to Ozempic.   When I last saw her she was having increase in Afib on propafenone . Was seen by Dr Marven Slimmer and underwent ablation in Dec 2023. Propafenone  discontinued and on last visit Diltiazem  also stopped.   As part of her ablation work up she did have a cardiac CT which showed a high calcium  score of 1097  She denies any chest pain or SOB. She was previously on lipitor but stopped due to some  myalgias. He activity has been limited recently by plantar fasciitis.    Past Medical History:  Diagnosis Date   Atrial fibrillation (HCC)    Back pain    Diabetes mellitus    Dysrhythmia    history Atrial Flutter. 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   Obesity    Restless legs    Sleep apnea    no cpap use now-unable to tolerate mask, surgery a yr ago   Vertigo     Past Surgical History:  Procedure Laterality Date   APPENDECTOMY     ATRIAL FIBRILLATION ABLATION N/A 05/30/2022   Procedure: ATRIAL FIBRILLATION ABLATION;  Surgeon: Boyce Byes, MD;  Location: [facility]IVE [facility];  Service: Cardiovascular;  Laterality: N/A;   BUNIONECTOMY     ESOPHAGOGASTRODUODENOSCOPY (EGD) WITH PROPOFOL  N/A 08/16/2015   Procedure: ESOPHAGOGASTRODUODENOSCOPY (EGD) WITH PROPOFOL ;  Surgeon: Garrett Kallman, MD;  Location: [facility] ENDOSCOPY;  Service: Endoscopy;  Laterality: N/A;   GANGLION CYST   1980   REMOVED    HEMORROIDECTOMY     THYROIDECTOMY, PARTIAL  2005   "partial  for growth-benign"   UPPER ENDOSCOPIC ULTRASOUND W/ FNA     UVULECTOMY     excised about a yr ago [facility]     Current Outpatient Medications  Medication Sig Dispense Refill   albuterol  (VENTOLIN  HFA) 108 (90 Base) MCG/ACT inhaler Inhale 2 puffs into the lungs every 6 (six) hours as needed for wheezing or shortness of breath.     Calcium -Magnesium -Vitamin D  (CALCIUM  1200+D3 PO) Take by mouth daily.     cetirizine (ZYRTEC) 10 MG tablet Take 10 mg by mouth daily.     Cholecalciferol (DIALYVITE VITAMIN D  5000) 125 MCG (5000 UT) capsule Take 5,000 Units by mouth daily.     cyclobenzaprine  (FLEXERIL ) 10 MG tablet TAKE 1 TABLET BY MOUTH EVERYDAY AT BEDTIME. Call [phone] to  schedule yearly follow up for ongoing refills 15 tablet 0   ELIQUIS  5 MG TABS tablet TAKE 1 TABLET BY MOUTH TWICE A DAY 180 tablet 1   fluticasone (FLONASE) 50 MCG/ACT nasal spray Place 1 spray into both nostrils 2 (two) times daily.     Magnesium  400 MG TABS Take 400 mg by mouth 2 (two) times daily.     Melatonin 10 MG TABS Take 10 mg by mouth at bedtime.      metFORMIN (GLUCOPHAGE-XR) 500 MG 24 hr tablet Take 1,000 mg by mouth 2 (two) times daily.      metoprolol  succinate (TOPROL -XL) 50 MG 24 hr tablet Take 1 tablet (50 mg total) by mouth daily. 90 tablet 3   omeprazole (PRILOSEC) 20 MG capsule Take 20 mg by mouth daily.     rosuvastatin (CRESTOR) 20 MG tablet Take 1 tablet (20 mg total) by mouth daily. 90 tablet 3   traMADol (ULTRAM) 50 MG tablet Take 50 mg by mouth 3 (three) times daily as needed (Pain).      No current facility-administered medications for this visit.    Allergies:   Doxycycline, Ozempic (0.25 or 0.5 mg-dose) [semaglutide(0.25 or 0.5mg -dos)], Yeast-derived drug products, Molds & smuts, Penicillins, and Sulfa drugs cross reactors    Social History:  The patient  reports that she has never smoked. She has never used smokeless tobacco. She reports that she does not drink alcohol and does not use drugs.   Family History:  The patient's family history includes Cancer (age of onset: 46) in her mother; Diabetes in her father; Drug abuse in her brother; Heart disease in her mother; Hepatitis C in her brother.    ROS:  Please see the history of present illness.   Otherwise, review of systems are positive for none.   All other systems are reviewed and negative.    PHYSICAL EXAM: VS:  BP (!) 152/64   Pulse 78   Ht 5\' 5"  (1.651 m)   Wt 215 lb 9.6 oz (97.8 kg)   SpO2 97%   BMI 35.88 kg/m  , BMI Body mass index is 35.88 kg/m. GEN: Well nourished, overweight, in no acute distress  HEENT: normal  Neck: no JVD, carotid bruits, or masses Cardiac: RRR; no murmurs, rubs, or  gallops,no edema  Respiratory:  clear to auscultation bilaterally, normal work of breathing GI: soft, nontender, nondistended, + BS MS: no deformity or atrophy  Skin: warm and dry, no rash Neuro:  Strength and sensation are intact Psych: euthymic mood, full affect     Recent Labs: No results found for requested labs within last 365 days.    Lipid Panel No results found for: "CHOL", "TRIG", "HDL", "CHOLHDL", "VLDL", "LDLCALC", "  LDLDIRECT"   Dated 09/25/18: A1c 7.6%. cholesterol 216, triglycerides 134, HDL 56, LDL 133. CMET and TSH normal. Dated 10/21/19: A1c 7.2%. cholesterol 157, triglycerides 185, HDL 54, LDL 72. Glucose 160 otherwise CMET normal.  Dated 04/23/20: A1c 7%. Cholesterol 200, triglycerides 161, HDL 55, LDL 117. Glucose 154 otherwise CMET and TSH normal.  Dated 03/23/21: cholesterol 150, triglycerides 157, HDL 48, LDL 84. A1c 7.2%.  Dated 3/27//23: cholesterol 149, triglycerides 116, HDL 61, LDL 68 Dated 10/04/21: A1c 4.0%. CMET normal. Dated 09/28/23: cholesterol 169, triglycerides 96, HDL 57, LDL 94, A1c 7.4%. CMET and CBC ok  Wt Readings from Last 3 Encounters:  11/02/23 215 lb 9.6 oz (97.8 kg)  04/18/23 222 lb 6.4 oz (100.9 kg)  09/06/22 230 lb (104.3 kg)      Other studies Reviewed: Additional studies/ records that were reviewed today include:   Echo 07/30/19: IMPRESSIONS     1. Left ventricular ejection fraction by 3D volume is 57 %. The left  ventricle has normal function. The left ventrical has no regional wall  motion abnormalities. There is mildly increased concentric left  ventricular hypertrophy. Left ventricular  diastolic parameters are consistent with Grade I diastolic dysfunction  (impaired relaxation). The average left ventricular global longitudinal  strain is -18.5 %.   2. Right ventricular systolic function is mildly reduced. The right  ventricular size is normal. Tricuspid regurgitation signal is inadequate  for assessing PA pressure.   3. The  mitral valve is normal in structure and function. trivial mitral  valve regurgitation. No evidence of mitral stenosis.   4. The aortic valve is normal in structure and function. Aortic valve  regurgitation is not visualized. No aortic stenosis is present.   ASSESSMENT AND PLAN:  1.  Paroxysmal Atrial fibrillation/ Flutter s/p Ablation in Dec 2023 with excellent results. Off AAD and rate control therapy. On Eliquis  2. DM type 2. Per PCP. Encourage weight loss and low carb diet. Intolerant of Ozempic.  3. HLD. LDL 94. Given high coronary calcium  score goal LDL <55. Recommend a trial of Crestor 20 mg daily. If myalgias recur would reduce to 10 mg. If unable to tolerate would consider Repatha. Recommend she have labs repeated in 3 months with PCP.  4. OSA on CPAP. Stressed importance of keeping weight under control. 5. High coronary calcium  score 1097. No active symptoms. Focus on risk factor modification. If she develops any symptoms will pursue ischemic evaluation.    Current medicines are reviewed at length with the patient today.  The patient does not have concerns regarding medicines.  Labs/ tests ordered today include:   No orders of the defined types were placed in this encounter.     Disposition:   FU with me in 6 months.   Signed, Ella Guillotte [place], MD  11/02/2023 9:20 AM    [facility] [address] [place], [place], [address] Phone [phone], Fax [phone]

## 2023-11-02 ENCOUNTER — Ambulatory Visit: Payer: Medicare Other | Attending: Cardiology | Admitting: Cardiology

## 2023-11-02 ENCOUNTER — Encounter: Payer: Self-pay | Admitting: Cardiology

## 2023-11-02 VITALS — BP 152/64 | HR 78 | Ht 65.0 in | Wt 215.6 lb

## 2023-11-02 DIAGNOSIS — I2510 Atherosclerotic heart disease of native coronary artery without angina pectoris: Secondary | ICD-10-CM | POA: Insufficient documentation

## 2023-11-02 DIAGNOSIS — I10 Essential (primary) hypertension: Secondary | ICD-10-CM | POA: Insufficient documentation

## 2023-11-02 DIAGNOSIS — I480 Paroxysmal atrial fibrillation: Secondary | ICD-10-CM | POA: Insufficient documentation

## 2023-11-02 DIAGNOSIS — E7800 Pure hypercholesterolemia, unspecified: Secondary | ICD-10-CM | POA: Diagnosis present

## 2023-11-02 DIAGNOSIS — G4733 Obstructive sleep apnea (adult) (pediatric): Secondary | ICD-10-CM | POA: Insufficient documentation

## 2023-11-02 MED ORDER — ROSUVASTATIN CALCIUM 20 MG PO TABS: 20.0000 mg | ORAL_TABLET | Freq: Every day | ORAL | 3 refills | Status: AC

## 2023-11-02 NOTE — Patient Instructions (Signed)
 Medication Instructions:  Start Crestor 20 mg daily Continue all other medications *If you need a refill on your cardiac medications before your next appointment, please call your pharmacy*  Lab Work: Have fasting lipid and hepatic panels in 3 months with PCP  Testing/Procedures: None ordered  Follow-Up: At [facility], you and your health needs are our priority.  As part of our continuing mission to provide you with exceptional heart care, our providers are all part of one team.  This team includes your primary Cardiologist (physician) and Advanced Practice Providers or APPs (Physician Assistants and Nurse Practitioners) who all work together to provide you with the care you need, when you need it.  Your next appointment:  6 months   Call in July to schedule Nov appointment    Provider:  Dr.Jordan    We recommend signing up for the patient portal called "MyChart".  Sign up information is provided on this After Visit Summary.  MyChart is used to connect with patients for Virtual Visits (Telemedicine).  Patients are able to view lab/test results, encounter notes, upcoming appointments, etc.  Non-urgent messages can be sent to your provider as well.   To learn more about what you can do with MyChart, go to [email or web address].

## 2023-12-20 ENCOUNTER — Other Ambulatory Visit: Payer: Self-pay | Admitting: Family Medicine

## 2023-12-20 DIAGNOSIS — N644 Mastodynia: Secondary | ICD-10-CM

## 2023-12-26 ENCOUNTER — Encounter

## 2023-12-26 ENCOUNTER — Other Ambulatory Visit

## 2024-01-02 ENCOUNTER — Ambulatory Visit
Admission: RE | Admit: 2024-01-02 | Discharge: 2024-01-02 | Disposition: A | Discharge status: Home / Self Care | Source: Ambulatory Visit | Attending: Family Medicine | Admitting: Family Medicine

## 2024-01-02 DIAGNOSIS — N644 Mastodynia: Secondary | ICD-10-CM

## 2024-01-16 ENCOUNTER — Other Ambulatory Visit (HOSPITAL_COMMUNITY): Payer: Self-pay

## 2024-01-16 MED ORDER — CETIRIZINE HCL 10 MG PO TABS
10.0000 mg | ORAL_TABLET | Freq: Every day | ORAL | 1 refills | Status: DC
  Filled 2024-01-16: qty 90, 90d supply, fill #0
  Filled 2024-01-17 – 2024-01-18 (×2): qty 30, 30d supply, fill #0
  Filled 2024-02-20: qty 30, 30d supply, fill #1
  Filled 2024-03-12 – 2024-03-18 (×2): qty 30, 30d supply, fill #2
  Filled 2024-04-07 – 2024-04-15 (×3): qty 30, 30d supply, fill #3
  Filled 2024-05-07 – 2024-05-08 (×2): qty 30, 30d supply, fill #4
  Filled 2024-06-16: qty 30, 30d supply, fill #5

## 2024-01-16 MED ORDER — OMEPRAZOLE 20 MG PO CPDR
20.0000 mg | DELAYED_RELEASE_CAPSULE | Freq: Every day | ORAL | 1 refills | Status: DC
  Filled 2024-01-16: qty 90, 90d supply, fill #0
  Filled 2024-01-17 – 2024-01-18 (×2): qty 30, 30d supply, fill #0
  Filled 2024-02-20 (×2): qty 30, 30d supply, fill #1
  Filled 2024-03-12 – 2024-03-18 (×2): qty 30, 30d supply, fill #2
  Filled 2024-04-07 – 2024-04-15 (×3): qty 30, 30d supply, fill #3
  Filled 2024-05-07 – 2024-05-08 (×2): qty 30, 30d supply, fill #4
  Filled 2024-06-16: qty 30, 30d supply, fill #5

## 2024-01-16 MED ORDER — METFORMIN HCL ER 500 MG PO TB24
1000.0000 mg | ORAL_TABLET | Freq: Two times a day (BID) | ORAL | 1 refills | Status: DC
  Filled 2024-01-16: qty 360, 90d supply, fill #0
  Filled 2024-01-17 – 2024-01-18 (×3): qty 120, 30d supply, fill #0
  Filled 2024-02-20 (×3): qty 120, 30d supply, fill #1
  Filled 2024-03-12 – 2024-03-18 (×2): qty 120, 30d supply, fill #2
  Filled 2024-04-07 – 2024-04-10 (×2): qty 120, 30d supply, fill #3

## 2024-01-16 MED ORDER — METOPROLOL SUCCINATE ER 50 MG PO TB24
50.0000 mg | ORAL_TABLET | Freq: Every evening | ORAL | 1 refills | Status: DC
  Filled 2024-01-16: qty 90, 90d supply, fill #0
  Filled 2024-01-17 – 2024-01-18 (×2): qty 30, 30d supply, fill #0
  Filled 2024-02-20 (×3): qty 30, 30d supply, fill #1
  Filled 2024-03-12 – 2024-03-18 (×2): qty 30, 30d supply, fill #2
  Filled 2024-04-07 – 2024-04-15 (×3): qty 30, 30d supply, fill #3
  Filled 2024-05-07 – 2024-05-08 (×2): qty 30, 30d supply, fill #4
  Filled 2024-06-16: qty 30, 30d supply, fill #5

## 2024-01-17 ENCOUNTER — Other Ambulatory Visit (HOSPITAL_COMMUNITY): Payer: Self-pay

## 2024-01-17 ENCOUNTER — Telehealth: Payer: Self-pay | Admitting: Cardiology

## 2024-01-17 ENCOUNTER — Other Ambulatory Visit: Payer: Self-pay

## 2024-01-17 MED ORDER — APIXABAN 5 MG PO TABS
5.0000 mg | ORAL_TABLET | Freq: Two times a day (BID) | ORAL | 1 refills | Status: DC
  Filled 2024-01-17 – 2024-01-18 (×2): qty 60, 30d supply, fill #0

## 2024-01-17 MED ORDER — MAGNESIUM GLYCINATE 120 MG PO CAPS
240.0000 mg | ORAL_CAPSULE | Freq: Every day | ORAL | 1 refills | Status: AC
  Filled 2024-01-17: qty 180, 90d supply, fill #0
  Filled 2024-01-18: qty 60, 30d supply, fill #0

## 2024-01-17 MED ORDER — CALCIUM CARB-CHOLECALCIFEROL 600-5 MG-MCG PO TABS
2.0000 | ORAL_TABLET | Freq: Every day | ORAL | 1 refills | Status: AC
  Filled 2024-01-17: qty 180, 90d supply, fill #0
  Filled 2024-01-18 – 2024-01-23 (×3): qty 60, 30d supply, fill #0
  Filled 2024-02-20 – 2024-03-10 (×5): qty 60, 30d supply, fill #1
  Filled 2024-04-07 – 2024-04-15 (×3): qty 60, 30d supply, fill #2
  Filled 2024-05-07 – 2024-05-08 (×2): qty 60, 30d supply, fill #3
  Filled 2024-06-16: qty 60, 30d supply, fill #4
  Filled 2024-07-15 – 2024-07-16 (×2): qty 60, 30d supply, fill #5

## 2024-01-17 NOTE — Telephone Encounter (Signed)
*  STAT* If patient is at the pharmacy, call can be transferred to refill team.   1. Which medications need to be refilled? (please list name of each medication and dose if known) ELIQUIS  5 MG TABS tablet  rosuvastatin  (CRESTOR ) 20 MG tablet   2. Would you like to learn more about the convenience, safety, & potential cost savings by using the [facility]? NA   3. Are you open to using the [facility] (Type [facility]. )NA   4. Which pharmacy/location (including street and city if local pharmacy) is medication to be sent to?  [facility] Phone: [phone]  Fax: [phone]       5. Do they need a 30 day or 90 day supply? 90 day

## 2024-01-17 NOTE — Telephone Encounter (Addendum)
 Pt last saw Dr Swaziland 11/02/23, last labs 09/28/23 Creat

## 2024-01-17 NOTE — Telephone Encounter (Signed)
 Prescription refill request for Eliquis  received. Indication: afib  Last office visit: Swaz[place]and 11/02/2023 Scr: 0.74, 07/03/2023 Age: 73 yo  Weight: 97.8 kg   Refill for Eliquis  sent.

## 2024-01-18 ENCOUNTER — Other Ambulatory Visit (HOSPITAL_BASED_OUTPATIENT_CLINIC_OR_DEPARTMENT_OTHER): Payer: Self-pay

## 2024-01-18 ENCOUNTER — Other Ambulatory Visit: Payer: Self-pay

## 2024-01-18 ENCOUNTER — Other Ambulatory Visit (HOSPITAL_COMMUNITY): Payer: Self-pay

## 2024-01-21 ENCOUNTER — Other Ambulatory Visit: Payer: Self-pay

## 2024-01-21 ENCOUNTER — Other Ambulatory Visit (HOSPITAL_BASED_OUTPATIENT_CLINIC_OR_DEPARTMENT_OTHER): Payer: Self-pay

## 2024-01-21 ENCOUNTER — Other Ambulatory Visit (HOSPITAL_COMMUNITY): Payer: Self-pay

## 2024-01-21 MED ORDER — ROSUVASTATIN CALCIUM 20 MG PO TABS
20.0000 mg | ORAL_TABLET | Freq: Every day | ORAL | 0 refills | Status: DC
  Filled 2024-01-21: qty 90, 90d supply, fill #0
  Filled 2024-01-22: qty 30, 30d supply, fill #0
  Filled 2024-02-20: qty 30, 30d supply, fill #1
  Filled 2024-03-12 – 2024-03-18 (×2): qty 30, 30d supply, fill #2

## 2024-01-22 ENCOUNTER — Other Ambulatory Visit: Payer: Self-pay

## 2024-01-22 ENCOUNTER — Other Ambulatory Visit (HOSPITAL_COMMUNITY): Payer: Self-pay

## 2024-01-22 ENCOUNTER — Other Ambulatory Visit (HOSPITAL_BASED_OUTPATIENT_CLINIC_OR_DEPARTMENT_OTHER): Payer: Self-pay

## 2024-01-23 ENCOUNTER — Other Ambulatory Visit (HOSPITAL_BASED_OUTPATIENT_CLINIC_OR_DEPARTMENT_OTHER): Payer: Self-pay

## 2024-01-23 ENCOUNTER — Other Ambulatory Visit: Payer: Self-pay

## 2024-01-24 ENCOUNTER — Other Ambulatory Visit (HOSPITAL_BASED_OUTPATIENT_CLINIC_OR_DEPARTMENT_OTHER): Payer: Self-pay

## 2024-01-24 ENCOUNTER — Other Ambulatory Visit: Payer: Self-pay

## 2024-01-25 ENCOUNTER — Other Ambulatory Visit: Payer: Self-pay

## 2024-01-29 ENCOUNTER — Other Ambulatory Visit (HOSPITAL_COMMUNITY): Payer: Self-pay

## 2024-02-10 ENCOUNTER — Other Ambulatory Visit: Payer: Self-pay | Admitting: Cardiology

## 2024-02-10 DIAGNOSIS — I480 Paroxysmal atrial fibrillation: Secondary | ICD-10-CM

## 2024-02-11 ENCOUNTER — Other Ambulatory Visit (HOSPITAL_BASED_OUTPATIENT_CLINIC_OR_DEPARTMENT_OTHER): Payer: Self-pay

## 2024-02-11 NOTE — Telephone Encounter (Signed)
 Prescription refill request for Eliquis  received. Indication: a fib Last office visit: 11/02/23 Scr: 0.74 epic 07/05/23 Age:  73 Weight: 97kg

## 2024-02-20 ENCOUNTER — Other Ambulatory Visit: Payer: Self-pay

## 2024-02-20 ENCOUNTER — Other Ambulatory Visit: Payer: Self-pay | Admitting: Cardiology

## 2024-02-20 ENCOUNTER — Other Ambulatory Visit (HOSPITAL_BASED_OUTPATIENT_CLINIC_OR_DEPARTMENT_OTHER): Payer: Self-pay

## 2024-02-20 ENCOUNTER — Other Ambulatory Visit (HOSPITAL_COMMUNITY): Payer: Self-pay

## 2024-02-20 MED ORDER — APIXABAN 5 MG PO TABS
5.0000 mg | ORAL_TABLET | Freq: Two times a day (BID) | ORAL | 1 refills | Status: AC
  Filled 2024-02-20: qty 60, 30d supply, fill #0
  Filled 2024-02-20: qty 180, 90d supply, fill #0
  Filled 2024-03-12 – 2024-03-18 (×2): qty 60, 30d supply, fill #1
  Filled 2024-04-07 – 2024-04-15 (×3): qty 60, 30d supply, fill #2
  Filled 2024-05-07 – 2024-05-08 (×2): qty 60, 30d supply, fill #3
  Filled 2024-06-16: qty 60, 30d supply, fill #4
  Filled 2024-07-15 – 2024-07-16 (×2): qty 60, 30d supply, fill #5

## 2024-02-20 NOTE — Telephone Encounter (Signed)
 Pt last saw Dr Swaziland 11/02/23, last labs 06/12/23 Creat 0.65 per care everywhere, age 73, weight 97.8kg, based on specified criteria pt is on appropriate dosage of Eliquis  5mg  BID for afib.  Will refill rx.

## 2024-02-21 ENCOUNTER — Other Ambulatory Visit: Payer: Self-pay

## 2024-03-03 ENCOUNTER — Other Ambulatory Visit (HOSPITAL_BASED_OUTPATIENT_CLINIC_OR_DEPARTMENT_OTHER): Payer: Self-pay

## 2024-03-10 ENCOUNTER — Emergency Department (HOSPITAL_BASED_OUTPATIENT_CLINIC_OR_DEPARTMENT_OTHER)
Admission: EM | Admit: 2024-03-10 | Discharge: 2024-03-10 | Disposition: A | Discharge status: Home / Self Care | Attending: Emergency Medicine | Admitting: Emergency Medicine

## 2024-03-10 ENCOUNTER — Other Ambulatory Visit: Payer: Self-pay

## 2024-03-10 ENCOUNTER — Encounter (HOSPITAL_BASED_OUTPATIENT_CLINIC_OR_DEPARTMENT_OTHER): Payer: Self-pay

## 2024-03-10 ENCOUNTER — Other Ambulatory Visit (HOSPITAL_BASED_OUTPATIENT_CLINIC_OR_DEPARTMENT_OTHER): Payer: Self-pay

## 2024-03-10 DIAGNOSIS — R197 Diarrhea, unspecified: Secondary | ICD-10-CM | POA: Diagnosis present

## 2024-03-10 DIAGNOSIS — D72829 Elevated white blood cell count, unspecified: Secondary | ICD-10-CM | POA: Diagnosis not present

## 2024-03-10 DIAGNOSIS — R8290 Unspecified abnormal findings in urine: Secondary | ICD-10-CM | POA: Insufficient documentation

## 2024-03-10 DIAGNOSIS — E119 Type 2 diabetes mellitus without complications: Secondary | ICD-10-CM | POA: Insufficient documentation

## 2024-03-10 DIAGNOSIS — I10 Essential (primary) hypertension: Secondary | ICD-10-CM | POA: Diagnosis not present

## 2024-03-10 DIAGNOSIS — E876 Hypokalemia: Secondary | ICD-10-CM | POA: Diagnosis not present

## 2024-03-10 LAB — CBC
HCT: 37.7 % (ref 36.0–46.0)
Hemoglobin: 12.8 g/dL (ref 12.0–15.0)
MCH: 27.6 pg (ref 26.0–34.0)
MCHC: 34 g/dL (ref 30.0–36.0)
MCV: 81.3 fL (ref 80.0–100.0)
Platelets: 361 K/uL (ref 150–400)
RBC: 4.64 MIL/uL (ref 3.87–5.11)
RDW: 12.2 % (ref 11.5–15.5)
WBC: 16.2 K/uL — ABNORMAL HIGH (ref 4.0–10.5)
nRBC: 0 % (ref 0.0–0.2)

## 2024-03-10 LAB — URINALYSIS, ROUTINE W REFLEX MICROSCOPIC
Glucose, UA: NEGATIVE mg/dL
Hgb urine dipstick: NEGATIVE
Ketones, ur: NEGATIVE mg/dL
Nitrite: NEGATIVE
Protein, ur: 100 mg/dL — AB
Specific Gravity, Urine: 1.02 (ref 1.005–1.030)
pH: 6 (ref 5.0–8.0)

## 2024-03-10 LAB — COMPREHENSIVE METABOLIC PANEL WITH GFR
ALT: 15 U/L (ref 0–44)
AST: 25 U/L (ref 15–41)
Albumin: 4.7 g/dL (ref 3.5–5.0)
Alkaline Phosphatase: 73 U/L (ref 38–126)
Anion gap: 19 — ABNORMAL HIGH (ref 5–15)
BUN: 7 mg/dL — ABNORMAL LOW (ref 8–23)
CO2: 24 mmol/L (ref 22–32)
Calcium: 7.2 mg/dL — ABNORMAL LOW (ref 8.9–10.3)
Chloride: 96 mmol/L — ABNORMAL LOW (ref 98–111)
Creatinine, Ser: 0.68 mg/dL (ref 0.44–1.00)
GFR, Estimated: >60 mL/min (ref >60)
Glucose, Bld: 197 mg/dL — ABNORMAL HIGH (ref 70–99)
Potassium: 3.1 mmol/L — ABNORMAL LOW (ref 3.5–5.1)
Sodium: 139 mmol/L (ref 135–145)
Total Bilirubin: 0.4 mg/dL (ref 0.0–1.2)
Total Protein: 7.8 g/dL (ref 6.5–8.1)

## 2024-03-10 LAB — URINALYSIS, MICROSCOPIC (REFLEX)

## 2024-03-10 LAB — MAGNESIUM: Magnesium: <0.5 mg/dL — CL (ref 1.7–2.4)

## 2024-03-10 LAB — LIPASE, BLOOD: Lipase: 21 U/L (ref 11–51)

## 2024-03-10 MED ORDER — POTASSIUM CHLORIDE CRYS ER 20 MEQ PO TBCR
40.0000 meq | EXTENDED_RELEASE_TABLET | Freq: Once | ORAL | Status: AC
Start: 2024-03-10 — End: 2024-03-10
  Administered 2024-03-10: 40 meq via ORAL
  Filled 2024-03-10: qty 2

## 2024-03-10 MED ORDER — PANTOPRAZOLE SODIUM 40 MG IV SOLR
40.0000 mg | Freq: Once | INTRAVENOUS | Status: AC
  Administered 2024-03-10: 40 mg via INTRAVENOUS
  Filled 2024-03-10: qty 10

## 2024-03-10 MED ORDER — SODIUM CHLORIDE 0.9 % IV BOLUS
1000.0000 mL | Freq: Once | INTRAVENOUS | Status: AC
  Administered 2024-03-10: 1000 mL via INTRAVENOUS

## 2024-03-10 MED ORDER — LOPERAMIDE HCL 2 MG PO CAPS
2.0000 mg | ORAL_CAPSULE | Freq: Once | ORAL | Status: AC
  Administered 2024-03-10: 2 mg via ORAL
  Filled 2024-03-10: qty 1

## 2024-03-10 MED ORDER — ONDANSETRON HCL 4 MG/2ML IJ SOLN
4.0000 mg | Freq: Once | INTRAMUSCULAR | Status: AC
  Administered 2024-03-10: 4 mg via INTRAVENOUS
  Filled 2024-03-10: qty 2

## 2024-03-10 MED ORDER — CALCIUM GLUCONATE-NACL 1-0.675 GM/50ML-% IV SOLN
1.0000 g | Freq: Once | INTRAVENOUS | Status: AC
  Administered 2024-03-10: 1000 mg via INTRAVENOUS
  Filled 2024-03-10: qty 50

## 2024-03-10 MED ORDER — ONDANSETRON 4 MG PO TBDP
4.0000 mg | ORAL_TABLET | Freq: Three times a day (TID) | ORAL | 0 refills | Status: AC | PRN
  Filled 2024-03-10: qty 20, 7d supply, fill #0

## 2024-03-10 MED ORDER — MAGNESIUM SULFATE 2 GM/50ML IV SOLN
2.0000 g | Freq: Once | INTRAVENOUS | Status: AC
Start: 2024-03-10 — End: 2024-03-10
  Administered 2024-03-10: 2 g via INTRAVENOUS
  Filled 2024-03-10: qty 50

## 2024-03-10 MED ORDER — POTASSIUM CHLORIDE CRYS ER 20 MEQ PO TBCR
40.0000 meq | EXTENDED_RELEASE_TABLET | Freq: Every day | ORAL | 0 refills | Status: AC
  Filled 2024-03-10: qty 8, 4d supply, fill #0

## 2024-03-10 NOTE — ED Provider Notes (Signed)
 Emergency Department Provider Note   I have reviewed the triage vital signs and the nursing notes.   HISTORY  Chief Complaint Diarrhea   HPI Suzanne Greer is a 73 y.o. female past history reviewed below presents emergency department with nausea and mainly diarrhea for the past 3 days.  No significant abdominal pain.  She does feel some epigastric fullness but no pain.  She is able to drink fluids but does not have much of an appetite.  She has had some tingling around her mouth.  No unilateral numbness or weakness.  No blood in the stool that she can see.  She has been taking Pepto-Bismol with no lasting relief.  Recent travel.  No recent antibiotics.  No recent hospitalization.   Past Medical History:  Diagnosis Date   Atrial fibrillation (HCC)    Back pain    Diabetes mellitus    Dysrhythmia    history Atrial Flutter. 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 Positive weakness.  Cardiovascular: Denies chest pain. Respiratory: Denies shortness of breath. Gastrointestinal: No abdominal pain.  Positive nausea, no vomiting. Positive diarrhea.  No constipation. Skin: Negative for rash. Neurological: Negative for headaches.  ____________________________________________   PHYSICAL EXAM:  VITAL SIGNS: ED Triage Vitals  Encounter Vitals Group     BP 03/10/24 0950 (!) 146/77     Pulse Rate 03/10/24 0950 82     Resp 03/10/24 0950 17     Temp 03/10/24 0950 98.4 F (36.9 C)     Temp Source 03/10/24 0950 Oral     SpO2 03/10/24 0950 98 %   Constitutional: Alert  and oriented. Well appearing and in no acute distress. Eyes: Conjunctivae are normal.  Head: Atraumatic. Nose: No congestion/rhinnorhea. Mouth/Throat: Mucous membranes are moist.   Neck: No stridor.   Cardiovascular: Normal rate, regular rhythm. Good peripheral circulation. Grossly normal heart sounds.   Respiratory: Normal respiratory effort.  No retractions. Lungs CTAB. Gastrointestinal: Soft and nontender. No distention.  Musculoskeletal: No lower extremity tenderness nor edema. No gross deformities of extremities. Neurologic:  Normal speech and language.  Skin:  Skin is warm, dry and intact. No rash noted.   ____________________________________________   LABS (all labs ordered are listed, but only abnormal results are displayed)  Labs Reviewed  COMPREHENSIVE METABOLIC PANEL WITH GFR - Abnormal; Notable for the following components:      Result Value   Potassium 3.1 (*)    Chloride 96 (*)    Glucose, Bld 197 (*)    BUN 7 (*)    Calcium  7.2 (*)    Anion gap 19 (*)    All other components within normal limits  CBC - Abnormal; Notable for the following components:   WBC 16.2 (*)    All other components within normal limits  URINALYSIS, ROUTINE W REFLEX MICROSCOPIC - Abnormal; Notable for the following components:   Bilirubin Urine SMALL (*)    Protein, ur 100 (*)    Leukocytes,Ua TRACE (*)  All other components within normal limits  MAGNESIUM  - Abnormal; Notable for the following components:   Magnesium  <0.5 (*)    All other components within normal limits  URINALYSIS, MICROSCOPIC (REFLEX) - Abnormal; Notable for the following components:   Bacteria, UA FEW (*)    All other components within normal limits  URINE CULTURE  LIPASE, BLOOD   ____________________________________________   PROCEDURES  Procedure(s) performed:   Procedures  None  ____________________________________________   INITIAL IMPRESSION / ASSESSMENT AND PLAN / ED COURSE  Pertinent labs &  imaging results that were available during my care of the patient were reviewed by me and considered in my medical decision making (see chart for details).   This patient is Presenting for Evaluation of diarrhea, which does require a range of treatment options, and is a complaint that involves a high risk of morbidity and mortality.  The Differential Diagnoses include viral enteritis, diverticulitis, colitis, C. Difficile, etc.  Critical Interventions-    Medications  sodium chloride  0.9 % bolus 1,000 mL (0 mLs Intravenous Stopped 03/10/24 1125)  ondansetron  (ZOFRAN ) injection 4 mg (4 mg Intravenous Given 03/10/24 1024)  loperamide  (IMODIUM ) capsule 2 mg (2 mg Oral Given 03/10/24 1021)  pantoprazole  (PROTONIX ) injection 40 mg (40 mg Intravenous Given 03/10/24 1021)  potassium chloride  SA (KLOR-CON  M) CR tablet 40 mEq (40 mEq Oral Given 03/10/24 1102)  magnesium  sulfate IVPB 2 g 50 mL (0 g Intravenous Stopped 03/10/24 1200)  calcium  gluconate 1 g/ 50 mL sodium chloride  IVPB (0 mg Intravenous Stopped 03/10/24 1306)    Reassessment after intervention: symptoms improved.   Clinical Laboratory Tests Ordered, included CBC with leukocytosis to 16.2.  No anemia. No AKI. Severely low Mg and mild decreased K and Ca.  Cardiac Monitor Tracing which shows NSR.    Social Determinants of Health Risk patient is a non-smoker.   Medical Decision Making: Summary:  Patient presents to the emergency department with mainly diarrhea symptoms.  No focal tenderness on abdominal exam.  Vital signs are unremarkable other than mild hypertension.  Do not plan for abdominal imaging.  Plan to follow labs, IV fluids, Imodium , Zofran  and reassess.   Reevaluation with update and discussion with patient. Discussed admit for IVF and electrolyte replacement. Patient is feeling improved after IVF. Mg and Ca along with K replaced. She has Mg replacement supplements at home and will continue. KDur sent to pharmacy. Patient is  tolerating PO.   Considered admission but after discussion with patient, she would prefer to try mgmt at home with close PCP follow up.   Patient's presentation is most consistent with acute presentation with potential threat to life or bodily function.   Disposition: discharge  ____________________________________________  FINAL CLINICAL IMPRESSION(S) / ED DIAGNOSES  Final diagnoses:  Diarrhea of presumed infectious origin  Hypokalemia  Hypomagnesemia  Hypocalcemia     NEW OUTPATIENT MEDICATIONS STARTED DURING THIS VISIT:  Discharge Medication List as of 03/10/2024  1:04 PM     START taking these medications   Details  ondansetron  (ZOFRAN -ODT) 4 MG disintegrating tablet Take 1 tablet (4 mg total) by mouth every 8 (eight) hours as needed., Starting Mon 03/10/2024, Normal    potassium chloride  SA (KLOR-CON  M) 20 MEQ tablet Take 2 tablets (40 mEq total) by mouth daily for 4 days., Starting Mon 03/10/2024, Until Fri 03/14/2024, Normal        Note:  This document was prepared using Dragon voice recognition software and may include unintentional dictation errors.  Fonda Law, MD,  [facility]    Bryssa Tones, Fonda MATSU, MD 03/11/24 [phone]

## 2024-03-10 NOTE — Discharge Instructions (Signed)
 You were seen in the emergency department today with diarrhea.  This has caused some of your electrolytes to be low especially your magnesium .  Your calcium  and potassium are slightly low.  We have replaced these today and I am sending you home with supplements for your potassium to the pharmacy.  Please continue your magnesium  supplements at home.  I would like for you to follow-up with your primary care doctor for blood work this week to make sure these are improving.  You may take your Imodium  at home as needed for diarrhea and I have called in a prescription for Zofran  as well.

## 2024-03-10 NOTE — ED Triage Notes (Signed)
 Diarrhea, nausea for 3 days.  Denies abd pain, emesis, dysuria  T2DM

## 2024-03-11 LAB — URINE CULTURE: Culture: <10000 — AB

## 2024-03-12 ENCOUNTER — Other Ambulatory Visit: Payer: Self-pay

## 2024-03-12 ENCOUNTER — Other Ambulatory Visit (HOSPITAL_BASED_OUTPATIENT_CLINIC_OR_DEPARTMENT_OTHER): Payer: Self-pay

## 2024-03-18 ENCOUNTER — Other Ambulatory Visit: Payer: Self-pay

## 2024-03-19 ENCOUNTER — Other Ambulatory Visit: Payer: Self-pay

## 2024-04-07 ENCOUNTER — Other Ambulatory Visit (HOSPITAL_BASED_OUTPATIENT_CLINIC_OR_DEPARTMENT_OTHER): Payer: Self-pay

## 2024-04-07 ENCOUNTER — Other Ambulatory Visit: Payer: Self-pay

## 2024-04-07 MED ORDER — ROSUVASTATIN CALCIUM 20 MG PO TABS
20.0000 mg | ORAL_TABLET | Freq: Every day | ORAL | 3 refills | Status: AC
  Filled 2024-04-07: qty 90, 90d supply, fill #0
  Filled 2024-04-11 – 2024-04-15 (×2): qty 30, 30d supply, fill #0
  Filled 2024-05-07 – 2024-05-08 (×2): qty 30, 30d supply, fill #1
  Filled 2024-06-16: qty 30, 30d supply, fill #2
  Filled 2024-07-15 – 2024-07-16 (×2): qty 30, 30d supply, fill #3

## 2024-04-07 MED ORDER — TRAMADOL HCL 50 MG PO TABS: 50.0000 mg | ORAL_TABLET | Freq: Three times a day (TID) | ORAL | 1 refills | Status: AC

## 2024-04-07 MED ORDER — JARDIANCE 25 MG PO TABS
25.0000 mg | ORAL_TABLET | Freq: Every day | ORAL | 3 refills | Status: DC
  Filled 2024-04-07: qty 90, 90d supply, fill #0
  Filled 2024-04-07: qty 10, 10d supply, fill #0
  Filled 2024-04-15 (×2): qty 30, 30d supply, fill #1
  Filled 2024-05-07 – 2024-05-08 (×2): qty 30, 30d supply, fill #2

## 2024-04-10 ENCOUNTER — Other Ambulatory Visit (HOSPITAL_BASED_OUTPATIENT_CLINIC_OR_DEPARTMENT_OTHER): Payer: Self-pay

## 2024-04-10 ENCOUNTER — Other Ambulatory Visit: Payer: Self-pay

## 2024-04-11 ENCOUNTER — Other Ambulatory Visit (HOSPITAL_BASED_OUTPATIENT_CLINIC_OR_DEPARTMENT_OTHER): Payer: Self-pay

## 2024-04-11 ENCOUNTER — Other Ambulatory Visit: Payer: Self-pay

## 2024-04-12 ENCOUNTER — Encounter (HOSPITAL_BASED_OUTPATIENT_CLINIC_OR_DEPARTMENT_OTHER): Payer: Self-pay

## 2024-04-14 ENCOUNTER — Other Ambulatory Visit: Payer: Self-pay

## 2024-04-14 ENCOUNTER — Other Ambulatory Visit (HOSPITAL_BASED_OUTPATIENT_CLINIC_OR_DEPARTMENT_OTHER): Payer: Self-pay

## 2024-04-15 ENCOUNTER — Other Ambulatory Visit: Payer: Self-pay

## 2024-04-16 ENCOUNTER — Other Ambulatory Visit: Payer: Self-pay

## 2024-05-07 ENCOUNTER — Other Ambulatory Visit (HOSPITAL_BASED_OUTPATIENT_CLINIC_OR_DEPARTMENT_OTHER): Payer: Self-pay

## 2024-05-07 ENCOUNTER — Other Ambulatory Visit: Payer: Self-pay

## 2024-05-08 ENCOUNTER — Other Ambulatory Visit: Payer: Self-pay

## 2024-05-09 ENCOUNTER — Other Ambulatory Visit: Payer: Self-pay

## 2024-05-12 ENCOUNTER — Other Ambulatory Visit: Payer: Self-pay

## 2024-05-13 ENCOUNTER — Other Ambulatory Visit: Payer: Self-pay

## 2024-05-19 ENCOUNTER — Other Ambulatory Visit (HOSPITAL_BASED_OUTPATIENT_CLINIC_OR_DEPARTMENT_OTHER): Payer: Self-pay

## 2024-06-06 ENCOUNTER — Other Ambulatory Visit (HOSPITAL_BASED_OUTPATIENT_CLINIC_OR_DEPARTMENT_OTHER): Payer: Self-pay

## 2024-06-06 MED ORDER — MOUNJARO 2.5 MG/0.5ML ~~LOC~~ SOAJ
2.5000 mg | SUBCUTANEOUS | 5 refills | Status: AC
  Filled 2024-06-06: qty 2, 28d supply, fill #0
  Filled 2024-07-04: qty 2, 28d supply, fill #1

## 2024-06-16 ENCOUNTER — Other Ambulatory Visit (HOSPITAL_BASED_OUTPATIENT_CLINIC_OR_DEPARTMENT_OTHER): Payer: Self-pay

## 2024-06-16 ENCOUNTER — Other Ambulatory Visit: Payer: Self-pay

## 2024-06-17 ENCOUNTER — Other Ambulatory Visit: Payer: Self-pay

## 2024-06-20 ENCOUNTER — Other Ambulatory Visit (HOSPITAL_BASED_OUTPATIENT_CLINIC_OR_DEPARTMENT_OTHER): Payer: Self-pay

## 2024-07-04 ENCOUNTER — Other Ambulatory Visit: Payer: Self-pay

## 2024-07-04 ENCOUNTER — Other Ambulatory Visit (HOSPITAL_BASED_OUTPATIENT_CLINIC_OR_DEPARTMENT_OTHER): Payer: Self-pay

## 2024-07-04 MED ORDER — TRAMADOL HCL 50 MG PO TABS
50.0000 mg | ORAL_TABLET | Freq: Three times a day (TID) | ORAL | 0 refills | Status: AC | PRN
  Filled 2024-07-04: qty 90, 30d supply, fill #0

## 2024-07-07 ENCOUNTER — Other Ambulatory Visit (HOSPITAL_BASED_OUTPATIENT_CLINIC_OR_DEPARTMENT_OTHER): Payer: Self-pay

## 2024-07-15 ENCOUNTER — Other Ambulatory Visit (HOSPITAL_BASED_OUTPATIENT_CLINIC_OR_DEPARTMENT_OTHER): Payer: Self-pay

## 2024-07-15 ENCOUNTER — Other Ambulatory Visit: Payer: Self-pay

## 2024-07-15 MED ORDER — OMEPRAZOLE 20 MG PO CPDR
20.0000 mg | DELAYED_RELEASE_CAPSULE | Freq: Every day | ORAL | 1 refills | Status: AC
  Filled 2024-07-15: qty 90, 90d supply, fill #0
  Filled 2024-07-16: qty 30, 30d supply, fill #0

## 2024-07-15 MED ORDER — METOPROLOL SUCCINATE ER 50 MG PO TB24
50.0000 mg | ORAL_TABLET | ORAL | 1 refills | Status: AC
  Filled 2024-07-15: qty 90, 90d supply, fill #0
  Filled 2024-07-16: qty 30, 30d supply, fill #0

## 2024-07-16 ENCOUNTER — Other Ambulatory Visit: Payer: Self-pay

## 2024-07-16 ENCOUNTER — Other Ambulatory Visit (HOSPITAL_COMMUNITY): Payer: Self-pay

## 2024-07-16 ENCOUNTER — Other Ambulatory Visit (HOSPITAL_BASED_OUTPATIENT_CLINIC_OR_DEPARTMENT_OTHER): Payer: Self-pay

## 2024-07-16 MED ORDER — CETIRIZINE HCL 10 MG PO TABS
10.0000 mg | ORAL_TABLET | Freq: Every day | ORAL | 1 refills | Status: AC
  Filled 2024-07-16: qty 90, 90d supply, fill #0

## 2024-07-17 ENCOUNTER — Other Ambulatory Visit (HOSPITAL_COMMUNITY): Payer: Self-pay
